# Patient Record
Sex: Male | Born: 1935 | Race: White | Hispanic: No | Smoking: Former smoker
Health system: Southern US, Community
[De-identification: ages and names within clinical notes are randomized; demographics above are authoritative.]

## PROBLEM LIST (undated history)

## (undated) DIAGNOSIS — K7460 Unspecified cirrhosis of liver: Secondary | ICD-10-CM

## (undated) DIAGNOSIS — E119 Type 2 diabetes mellitus without complications: Secondary | ICD-10-CM

## (undated) DIAGNOSIS — I2510 Atherosclerotic heart disease of native coronary artery without angina pectoris: Secondary | ICD-10-CM

## (undated) DIAGNOSIS — E538 Deficiency of other specified B group vitamins: Secondary | ICD-10-CM

## (undated) DIAGNOSIS — I10 Essential (primary) hypertension: Secondary | ICD-10-CM

## (undated) DIAGNOSIS — E785 Hyperlipidemia, unspecified: Secondary | ICD-10-CM

## (undated) DIAGNOSIS — K439 Ventral hernia without obstruction or gangrene: Secondary | ICD-10-CM

## (undated) DIAGNOSIS — C189 Malignant neoplasm of colon, unspecified: Secondary | ICD-10-CM

## (undated) DIAGNOSIS — N189 Chronic kidney disease, unspecified: Secondary | ICD-10-CM

## (undated) DIAGNOSIS — I8510 Secondary esophageal varices without bleeding: Secondary | ICD-10-CM

## (undated) DIAGNOSIS — I712 Thoracic aortic aneurysm, without rupture: Secondary | ICD-10-CM

## (undated) HISTORY — DX: Unspecified cirrhosis of liver: K74.60

## (undated) HISTORY — DX: Atherosclerotic heart disease of native coronary artery without angina pectoris: I25.10

## (undated) HISTORY — DX: Type 2 diabetes mellitus without complications: E11.9

## (undated) HISTORY — DX: Malignant neoplasm of colon, unspecified: C18.9

## (undated) HISTORY — DX: Hyperlipidemia, unspecified: E78.5

## (undated) HISTORY — DX: Essential (primary) hypertension: I10

## (undated) HISTORY — DX: Ventral hernia without obstruction or gangrene: K43.9

## (undated) HISTORY — DX: Thoracic aortic aneurysm, without rupture: I71.2

---

## 2000-07-16 DIAGNOSIS — C189 Malignant neoplasm of colon, unspecified: Secondary | ICD-10-CM

## 2000-07-16 HISTORY — DX: Malignant neoplasm of colon, unspecified: C18.9

## 2001-04-15 HISTORY — PX: PARTIAL COLECTOMY: SHX5273

## 2001-04-23 ENCOUNTER — Inpatient Hospital Stay (HOSPITAL_COMMUNITY): Admission: EM | Admit: 2001-04-23 | Discharge: 2001-05-06 | Payer: Self-pay | Admitting: Emergency Medicine

## 2001-04-23 ENCOUNTER — Encounter: Payer: Self-pay | Admitting: Emergency Medicine

## 2001-04-23 ENCOUNTER — Encounter (INDEPENDENT_AMBULATORY_CARE_PROVIDER_SITE_OTHER): Payer: Self-pay | Admitting: Specialist

## 2001-04-24 ENCOUNTER — Encounter: Payer: Self-pay | Admitting: Internal Medicine

## 2001-04-25 ENCOUNTER — Encounter: Payer: Self-pay | Admitting: Surgery

## 2001-04-25 ENCOUNTER — Encounter: Payer: Self-pay | Admitting: Internal Medicine

## 2001-04-29 ENCOUNTER — Encounter: Payer: Self-pay | Admitting: Internal Medicine

## 2001-04-30 ENCOUNTER — Encounter: Payer: Self-pay | Admitting: Internal Medicine

## 2001-05-28 ENCOUNTER — Encounter: Payer: Self-pay | Admitting: Surgery

## 2001-05-28 ENCOUNTER — Ambulatory Visit (HOSPITAL_BASED_OUTPATIENT_CLINIC_OR_DEPARTMENT_OTHER): Admission: RE | Admit: 2001-05-28 | Discharge: 2001-05-28 | Payer: Self-pay | Admitting: Surgery

## 2001-08-12 ENCOUNTER — Encounter: Admission: RE | Admit: 2001-08-12 | Discharge: 2001-08-12 | Payer: Self-pay | Admitting: Family Medicine

## 2001-11-26 ENCOUNTER — Encounter: Payer: Self-pay | Admitting: Surgery

## 2001-11-26 ENCOUNTER — Encounter: Admission: RE | Admit: 2001-11-26 | Discharge: 2001-11-26 | Payer: Self-pay | Admitting: Surgery

## 2001-12-01 ENCOUNTER — Encounter: Admission: RE | Admit: 2001-12-01 | Discharge: 2001-12-01 | Payer: Self-pay | Admitting: Surgery

## 2001-12-01 ENCOUNTER — Encounter: Payer: Self-pay | Admitting: Surgery

## 2001-12-05 ENCOUNTER — Encounter (INDEPENDENT_AMBULATORY_CARE_PROVIDER_SITE_OTHER): Payer: Self-pay

## 2001-12-05 ENCOUNTER — Ambulatory Visit (HOSPITAL_COMMUNITY): Admission: RE | Admit: 2001-12-05 | Discharge: 2001-12-05 | Payer: Self-pay | Admitting: Surgery

## 2001-12-26 ENCOUNTER — Encounter: Admission: RE | Admit: 2001-12-26 | Discharge: 2001-12-26 | Payer: Self-pay | Admitting: Family Medicine

## 2002-01-01 ENCOUNTER — Encounter: Admission: RE | Admit: 2002-01-01 | Discharge: 2002-01-01 | Payer: Self-pay | Admitting: Family Medicine

## 2002-01-13 HISTORY — PX: COLOSTOMY REVERSAL: SHX5782

## 2002-01-14 ENCOUNTER — Encounter: Payer: Self-pay | Admitting: Surgery

## 2002-01-19 ENCOUNTER — Encounter (INDEPENDENT_AMBULATORY_CARE_PROVIDER_SITE_OTHER): Payer: Self-pay | Admitting: *Deleted

## 2002-01-19 ENCOUNTER — Inpatient Hospital Stay (HOSPITAL_COMMUNITY): Admission: RE | Admit: 2002-01-19 | Discharge: 2002-01-26 | Payer: Self-pay | Admitting: Surgery

## 2002-03-24 ENCOUNTER — Encounter: Admission: RE | Admit: 2002-03-24 | Discharge: 2002-03-24 | Payer: Self-pay | Admitting: Family Medicine

## 2002-05-25 ENCOUNTER — Encounter: Admission: RE | Admit: 2002-05-25 | Discharge: 2002-05-25 | Payer: Self-pay | Admitting: Family Medicine

## 2002-06-01 ENCOUNTER — Encounter (HOSPITAL_BASED_OUTPATIENT_CLINIC_OR_DEPARTMENT_OTHER): Admission: RE | Admit: 2002-06-01 | Discharge: 2002-08-31 | Payer: Self-pay | Admitting: Internal Medicine

## 2002-07-01 ENCOUNTER — Ambulatory Visit (HOSPITAL_BASED_OUTPATIENT_CLINIC_OR_DEPARTMENT_OTHER): Admission: RE | Admit: 2002-07-01 | Discharge: 2002-07-01 | Payer: Self-pay | Admitting: Surgery

## 2002-08-26 ENCOUNTER — Encounter: Admission: RE | Admit: 2002-08-26 | Discharge: 2002-08-26 | Payer: Self-pay | Admitting: Family Medicine

## 2002-09-04 ENCOUNTER — Encounter (HOSPITAL_BASED_OUTPATIENT_CLINIC_OR_DEPARTMENT_OTHER): Admission: RE | Admit: 2002-09-04 | Discharge: 2002-12-03 | Payer: Self-pay | Admitting: Internal Medicine

## 2002-11-14 HISTORY — PX: COLONOSCOPY: SHX174

## 2002-11-26 ENCOUNTER — Ambulatory Visit (HOSPITAL_COMMUNITY): Admission: RE | Admit: 2002-11-26 | Discharge: 2002-11-26 | Payer: Self-pay | Admitting: Surgery

## 2002-12-02 ENCOUNTER — Encounter: Admission: RE | Admit: 2002-12-02 | Discharge: 2002-12-02 | Payer: Self-pay | Admitting: Family Medicine

## 2003-03-10 ENCOUNTER — Encounter: Admission: RE | Admit: 2003-03-10 | Discharge: 2003-03-10 | Payer: Self-pay | Admitting: Family Medicine

## 2003-03-11 ENCOUNTER — Encounter (HOSPITAL_BASED_OUTPATIENT_CLINIC_OR_DEPARTMENT_OTHER): Admission: RE | Admit: 2003-03-11 | Discharge: 2003-03-23 | Payer: Self-pay | Admitting: Internal Medicine

## 2003-03-31 ENCOUNTER — Ambulatory Visit (HOSPITAL_COMMUNITY): Admission: RE | Admit: 2003-03-31 | Discharge: 2003-03-31 | Payer: Self-pay | Admitting: Sports Medicine

## 2003-06-16 ENCOUNTER — Encounter: Admission: RE | Admit: 2003-06-16 | Discharge: 2003-06-16 | Payer: Self-pay | Admitting: Family Medicine

## 2003-07-01 ENCOUNTER — Encounter (HOSPITAL_BASED_OUTPATIENT_CLINIC_OR_DEPARTMENT_OTHER): Admission: RE | Admit: 2003-07-01 | Discharge: 2003-07-16 | Payer: Self-pay | Admitting: Internal Medicine

## 2003-09-22 ENCOUNTER — Encounter: Admission: RE | Admit: 2003-09-22 | Discharge: 2003-09-22 | Payer: Self-pay | Admitting: Family Medicine

## 2003-10-06 ENCOUNTER — Encounter (HOSPITAL_BASED_OUTPATIENT_CLINIC_OR_DEPARTMENT_OTHER): Admission: RE | Admit: 2003-10-06 | Discharge: 2003-10-26 | Payer: Self-pay | Admitting: Internal Medicine

## 2003-12-16 ENCOUNTER — Encounter: Admission: RE | Admit: 2003-12-16 | Discharge: 2003-12-16 | Payer: Self-pay | Admitting: Sports Medicine

## 2004-01-12 ENCOUNTER — Encounter (HOSPITAL_BASED_OUTPATIENT_CLINIC_OR_DEPARTMENT_OTHER): Admission: RE | Admit: 2004-01-12 | Discharge: 2004-01-21 | Payer: Self-pay | Admitting: Internal Medicine

## 2004-03-28 ENCOUNTER — Ambulatory Visit: Payer: Self-pay | Admitting: Family Medicine

## 2004-04-19 ENCOUNTER — Encounter (HOSPITAL_BASED_OUTPATIENT_CLINIC_OR_DEPARTMENT_OTHER): Admission: RE | Admit: 2004-04-19 | Discharge: 2004-05-03 | Payer: Self-pay | Admitting: Internal Medicine

## 2004-06-22 ENCOUNTER — Ambulatory Visit: Payer: Self-pay | Admitting: Family Medicine

## 2004-07-25 ENCOUNTER — Encounter (HOSPITAL_BASED_OUTPATIENT_CLINIC_OR_DEPARTMENT_OTHER): Admission: RE | Admit: 2004-07-25 | Discharge: 2004-08-08 | Payer: Self-pay | Admitting: Internal Medicine

## 2004-08-24 ENCOUNTER — Ambulatory Visit: Payer: Self-pay | Admitting: Family Medicine

## 2004-09-19 ENCOUNTER — Ambulatory Visit: Payer: Self-pay | Admitting: Oncology

## 2004-09-28 ENCOUNTER — Encounter: Admission: RE | Admit: 2004-09-28 | Discharge: 2004-09-28 | Payer: Self-pay | Admitting: Oncology

## 2004-10-23 ENCOUNTER — Ambulatory Visit: Payer: Self-pay | Admitting: Family Medicine

## 2005-01-18 ENCOUNTER — Ambulatory Visit (HOSPITAL_BASED_OUTPATIENT_CLINIC_OR_DEPARTMENT_OTHER): Admission: RE | Admit: 2005-01-18 | Discharge: 2005-01-18 | Payer: Self-pay | Admitting: Surgery

## 2005-01-23 ENCOUNTER — Ambulatory Visit: Payer: Self-pay | Admitting: Family Medicine

## 2005-03-16 ENCOUNTER — Ambulatory Visit: Payer: Self-pay | Admitting: Oncology

## 2005-04-02 ENCOUNTER — Encounter: Admission: RE | Admit: 2005-04-02 | Discharge: 2005-04-02 | Payer: Self-pay | Admitting: Oncology

## 2005-04-15 HISTORY — PX: VENTRAL HERNIA REPAIR: SHX424

## 2005-04-21 ENCOUNTER — Inpatient Hospital Stay (HOSPITAL_COMMUNITY): Admission: RE | Admit: 2005-04-21 | Discharge: 2005-04-22 | Payer: Self-pay | Admitting: Surgery

## 2005-05-03 ENCOUNTER — Ambulatory Visit: Payer: Self-pay | Admitting: Family Medicine

## 2005-09-17 ENCOUNTER — Ambulatory Visit: Payer: Self-pay | Admitting: Oncology

## 2005-10-17 ENCOUNTER — Ambulatory Visit: Payer: Self-pay | Admitting: Family Medicine

## 2005-11-16 ENCOUNTER — Ambulatory Visit: Payer: Self-pay | Admitting: Family Medicine

## 2006-03-14 ENCOUNTER — Ambulatory Visit: Payer: Self-pay | Admitting: Oncology

## 2006-03-19 LAB — COMPREHENSIVE METABOLIC PANEL
AST: 121 U/L — ABNORMAL HIGH (ref 0–37)
BUN: 17 mg/dL (ref 6–23)
Calcium: 9.4 mg/dL (ref 8.4–10.5)
Chloride: 104 mEq/L (ref 96–112)
Creatinine, Ser: 1.2 mg/dL (ref 0.40–1.50)
Glucose, Bld: 102 mg/dL — ABNORMAL HIGH (ref 70–99)

## 2006-03-19 LAB — CBC WITH DIFFERENTIAL/PLATELET
BASO%: 0.4 % (ref 0.0–2.0)
Basophils Absolute: 0 10*3/uL (ref 0.0–0.1)
EOS%: 3.6 % (ref 0.0–7.0)
HCT: 39.7 % (ref 38.7–49.9)
LYMPH%: 32.4 % (ref 14.0–48.0)
MCH: 32.8 pg (ref 28.0–33.4)
MCHC: 35.2 g/dL (ref 32.0–35.9)
MONO#: 0.5 10*3/uL (ref 0.1–0.9)
NEUT%: 53.1 % (ref 40.0–75.0)
Platelets: 317 10*3/uL (ref 145–400)

## 2006-03-19 LAB — LACTATE DEHYDROGENASE: LDH: 316 U/L — ABNORMAL HIGH (ref 94–250)

## 2006-03-28 ENCOUNTER — Ambulatory Visit (HOSPITAL_COMMUNITY): Admission: RE | Admit: 2006-03-28 | Discharge: 2006-03-28 | Payer: Self-pay | Admitting: Oncology

## 2006-05-03 ENCOUNTER — Ambulatory Visit: Payer: Self-pay | Admitting: Family Medicine

## 2006-05-16 ENCOUNTER — Ambulatory Visit: Payer: Self-pay | Admitting: Oncology

## 2006-05-16 HISTORY — PX: COLONOSCOPY: SHX174

## 2006-05-17 ENCOUNTER — Encounter (INDEPENDENT_AMBULATORY_CARE_PROVIDER_SITE_OTHER): Payer: Self-pay | Admitting: *Deleted

## 2006-05-17 ENCOUNTER — Ambulatory Visit (HOSPITAL_COMMUNITY): Admission: RE | Admit: 2006-05-17 | Discharge: 2006-05-17 | Payer: Self-pay | Admitting: Surgery

## 2006-05-21 LAB — COMPREHENSIVE METABOLIC PANEL
Alkaline Phosphatase: 33 U/L — ABNORMAL LOW (ref 39–117)
BUN: 12 mg/dL (ref 6–23)
Creatinine, Ser: 1.06 mg/dL (ref 0.40–1.50)
Glucose, Bld: 93 mg/dL (ref 70–99)
Total Bilirubin: 0.5 mg/dL (ref 0.3–1.2)

## 2006-05-21 LAB — HEPATITIS C ANTIBODY: HCV Ab: NEGATIVE

## 2006-08-13 ENCOUNTER — Ambulatory Visit: Payer: Self-pay | Admitting: Family Medicine

## 2006-08-20 ENCOUNTER — Ambulatory Visit: Payer: Self-pay | Admitting: Family Medicine

## 2006-08-27 ENCOUNTER — Ambulatory Visit: Payer: Self-pay | Admitting: Family Medicine

## 2006-09-03 ENCOUNTER — Ambulatory Visit (HOSPITAL_COMMUNITY): Admission: RE | Admit: 2006-09-03 | Discharge: 2006-09-03 | Payer: Self-pay | Admitting: Sports Medicine

## 2006-09-03 ENCOUNTER — Encounter (INDEPENDENT_AMBULATORY_CARE_PROVIDER_SITE_OTHER): Payer: Self-pay | Admitting: Cardiology

## 2006-09-12 DIAGNOSIS — C189 Malignant neoplasm of colon, unspecified: Secondary | ICD-10-CM | POA: Insufficient documentation

## 2006-09-12 DIAGNOSIS — E119 Type 2 diabetes mellitus without complications: Secondary | ICD-10-CM | POA: Insufficient documentation

## 2006-09-12 DIAGNOSIS — R259 Unspecified abnormal involuntary movements: Secondary | ICD-10-CM | POA: Insufficient documentation

## 2006-09-12 DIAGNOSIS — E669 Obesity, unspecified: Secondary | ICD-10-CM | POA: Insufficient documentation

## 2006-09-12 DIAGNOSIS — I10 Essential (primary) hypertension: Secondary | ICD-10-CM

## 2006-09-12 DIAGNOSIS — Z85038 Personal history of other malignant neoplasm of large intestine: Secondary | ICD-10-CM | POA: Insufficient documentation

## 2006-09-12 DIAGNOSIS — E781 Pure hyperglyceridemia: Secondary | ICD-10-CM | POA: Insufficient documentation

## 2007-02-28 ENCOUNTER — Encounter (INDEPENDENT_AMBULATORY_CARE_PROVIDER_SITE_OTHER): Payer: Self-pay | Admitting: Family Medicine

## 2007-02-28 ENCOUNTER — Ambulatory Visit: Payer: Self-pay | Admitting: Family Medicine

## 2007-02-28 LAB — CONVERTED CEMR LAB: Hgb A1c MFr Bld: 6.4 %

## 2007-03-07 ENCOUNTER — Encounter (INDEPENDENT_AMBULATORY_CARE_PROVIDER_SITE_OTHER): Payer: Self-pay | Admitting: Family Medicine

## 2007-03-07 DIAGNOSIS — R945 Abnormal results of liver function studies: Secondary | ICD-10-CM | POA: Insufficient documentation

## 2007-03-07 LAB — CONVERTED CEMR LAB
ALT: 144 units/L — ABNORMAL HIGH (ref 0–53)
BUN: 18 mg/dL (ref 6–23)
Calcium: 10.4 mg/dL (ref 8.4–10.5)
Cholesterol: 229 mg/dL — ABNORMAL HIGH (ref 0–200)
HCT: 45.6 % (ref 39.0–52.0)
HDL: 33 mg/dL — ABNORMAL LOW (ref >39)
MCHC: 32.9 g/dL (ref 30.0–36.0)
Platelets: 309 10*3/uL (ref 150–400)
RBC: 4.78 M/uL (ref 4.22–5.81)
Total Bilirubin: 0.6 mg/dL (ref 0.3–1.2)
Triglycerides: 213 mg/dL — ABNORMAL HIGH (ref <150)
VLDL: 43 mg/dL — ABNORMAL HIGH (ref 0–40)
WBC: 4.5 10*3/uL (ref 4.0–10.5)

## 2007-04-07 ENCOUNTER — Encounter (INDEPENDENT_AMBULATORY_CARE_PROVIDER_SITE_OTHER): Payer: Self-pay | Admitting: Family Medicine

## 2007-04-07 ENCOUNTER — Ambulatory Visit: Payer: Self-pay | Admitting: Sports Medicine

## 2007-04-07 LAB — CONVERTED CEMR LAB
ALT: 141 units/L — ABNORMAL HIGH (ref 0–53)
AST: 127 units/L — ABNORMAL HIGH (ref 0–37)
Albumin: 4.2 g/dL (ref 3.5–5.2)
Alkaline Phosphatase: 46 units/L (ref 39–117)
CO2: 27 meq/L (ref 19–32)
Calcium: 10.2 mg/dL (ref 8.4–10.5)
Chloride: 103 meq/L (ref 96–112)
Glucose, Bld: 143 mg/dL — ABNORMAL HIGH (ref 70–99)
Total Bilirubin: 0.6 mg/dL (ref 0.3–1.2)
Total Protein: 7.3 g/dL (ref 6.0–8.3)

## 2007-05-14 ENCOUNTER — Encounter (INDEPENDENT_AMBULATORY_CARE_PROVIDER_SITE_OTHER): Payer: Self-pay | Admitting: Family Medicine

## 2007-05-15 ENCOUNTER — Ambulatory Visit: Payer: Self-pay | Admitting: Oncology

## 2007-05-19 ENCOUNTER — Encounter (INDEPENDENT_AMBULATORY_CARE_PROVIDER_SITE_OTHER): Payer: Self-pay | Admitting: Family Medicine

## 2007-05-19 LAB — CBC WITH DIFFERENTIAL/PLATELET
BASO%: 0.8 % (ref 0.0–2.0)
Eosinophils Absolute: 0.2 10*3/uL (ref 0.0–0.5)
HCT: 41.3 % (ref 38.7–49.9)
MCHC: 35.8 g/dL (ref 32.0–35.9)
MONO#: 0.6 10*3/uL (ref 0.1–0.9)
NEUT#: 2.9 10*3/uL (ref 1.5–6.5)
NEUT%: 50 % (ref 40.0–75.0)
Platelets: 293 10*3/uL (ref 145–400)
WBC: 5.8 10*3/uL (ref 4.0–10.0)
lymph#: 2.1 10*3/uL (ref 0.9–3.3)

## 2007-05-19 LAB — COMPREHENSIVE METABOLIC PANEL
ALT: 159 U/L — ABNORMAL HIGH (ref 0–53)
CO2: 25 mEq/L (ref 19–32)
Calcium: 9.3 mg/dL (ref 8.4–10.5)
Chloride: 102 mEq/L (ref 96–112)
Creatinine, Ser: 1.04 mg/dL (ref 0.40–1.50)
Glucose, Bld: 172 mg/dL — ABNORMAL HIGH (ref 70–99)
Sodium: 140 mEq/L (ref 135–145)
Total Protein: 7.3 g/dL (ref 6.0–8.3)

## 2007-05-19 LAB — LACTATE DEHYDROGENASE: LDH: 244 U/L (ref 94–250)

## 2007-05-19 LAB — CEA: CEA: 0.5 ng/mL (ref 0.0–5.0)

## 2007-05-21 ENCOUNTER — Encounter (INDEPENDENT_AMBULATORY_CARE_PROVIDER_SITE_OTHER): Payer: Self-pay | Admitting: Family Medicine

## 2007-05-21 ENCOUNTER — Ambulatory Visit (HOSPITAL_COMMUNITY): Admission: RE | Admit: 2007-05-21 | Discharge: 2007-05-21 | Payer: Self-pay | Admitting: Oncology

## 2007-05-26 ENCOUNTER — Ambulatory Visit: Payer: Self-pay | Admitting: Family Medicine

## 2007-05-26 LAB — CONVERTED CEMR LAB: HDL goal, serum: 40 mg/dL

## 2007-05-28 ENCOUNTER — Encounter: Payer: Self-pay | Admitting: *Deleted

## 2007-05-30 ENCOUNTER — Encounter (INDEPENDENT_AMBULATORY_CARE_PROVIDER_SITE_OTHER): Payer: Self-pay | Admitting: Family Medicine

## 2007-06-23 ENCOUNTER — Ambulatory Visit: Payer: Self-pay | Admitting: Family Medicine

## 2007-06-23 ENCOUNTER — Encounter (INDEPENDENT_AMBULATORY_CARE_PROVIDER_SITE_OTHER): Payer: Self-pay | Admitting: Family Medicine

## 2007-06-30 LAB — CONVERTED CEMR LAB
ALT: 167 units/L — ABNORMAL HIGH (ref 0–53)
Alkaline Phosphatase: 58 units/L (ref 39–117)
CO2: 24 meq/L (ref 19–32)
Chloride: 104 meq/L (ref 96–112)
Potassium: 4.7 meq/L (ref 3.5–5.3)
Sodium: 144 meq/L (ref 135–145)
Triglycerides: 210 mg/dL — ABNORMAL HIGH (ref <150)

## 2007-07-29 ENCOUNTER — Ambulatory Visit: Payer: Self-pay | Admitting: Family Medicine

## 2007-08-29 ENCOUNTER — Ambulatory Visit: Payer: Self-pay | Admitting: Family Medicine

## 2007-09-25 ENCOUNTER — Ambulatory Visit: Payer: Self-pay | Admitting: Family Medicine

## 2007-10-29 ENCOUNTER — Ambulatory Visit: Payer: Self-pay | Admitting: Family Medicine

## 2007-11-04 ENCOUNTER — Encounter (INDEPENDENT_AMBULATORY_CARE_PROVIDER_SITE_OTHER): Payer: Self-pay | Admitting: Family Medicine

## 2007-11-12 ENCOUNTER — Encounter (INDEPENDENT_AMBULATORY_CARE_PROVIDER_SITE_OTHER): Payer: Self-pay | Admitting: Family Medicine

## 2008-01-05 ENCOUNTER — Encounter (INDEPENDENT_AMBULATORY_CARE_PROVIDER_SITE_OTHER): Payer: Self-pay | Admitting: Family Medicine

## 2008-01-21 ENCOUNTER — Encounter: Payer: Self-pay | Admitting: Family Medicine

## 2008-01-21 ENCOUNTER — Ambulatory Visit: Payer: Self-pay | Admitting: Family Medicine

## 2008-01-21 LAB — CONVERTED CEMR LAB
ALT: 80 units/L — ABNORMAL HIGH (ref 0–53)
AST: 70 units/L — ABNORMAL HIGH (ref 0–37)
Alkaline Phosphatase: 39 units/L (ref 39–117)
BUN: 15 mg/dL (ref 6–23)
CO2: 27 meq/L (ref 19–32)
Calcium: 9.4 mg/dL (ref 8.4–10.5)
Chloride: 101 meq/L (ref 96–112)
Glucose, Bld: 135 mg/dL — ABNORMAL HIGH (ref 70–99)
Hgb A1c MFr Bld: 6.4 %
Potassium: 4.8 meq/L (ref 3.5–5.3)

## 2008-01-22 ENCOUNTER — Telehealth: Payer: Self-pay | Admitting: Family Medicine

## 2008-01-29 ENCOUNTER — Ambulatory Visit: Payer: Self-pay | Admitting: Oncology

## 2008-02-02 ENCOUNTER — Encounter: Payer: Self-pay | Admitting: Family Medicine

## 2008-02-02 LAB — CONVERTED CEMR LAB
ALT: 110 units/L
AST: 91 units/L
BUN: 16 mg/dL
CO2: 24 meq/L
Creatinine, Ser: 1.12 mg/dL
Glucose, Bld: 104 mg/dL
Sodium: 139 meq/L

## 2008-02-02 LAB — CBC WITH DIFFERENTIAL/PLATELET
Basophils Absolute: 0 10*3/uL (ref 0.0–0.1)
Eosinophils Absolute: 0.2 10*3/uL (ref 0.0–0.5)
HCT: 39.8 % (ref 38.7–49.9)
HGB: 14 g/dL (ref 13.0–17.1)
LYMPH%: 31.1 % (ref 14.0–48.0)
MONO#: 0.6 10*3/uL (ref 0.1–0.9)
NEUT#: 3.3 10*3/uL (ref 1.5–6.5)
Platelets: 289 10*3/uL (ref 145–400)
RBC: 4.32 10*6/uL (ref 4.20–5.71)
WBC: 5.9 10*3/uL (ref 4.0–10.0)

## 2008-02-03 LAB — COMPREHENSIVE METABOLIC PANEL
ALT: 110 U/L — ABNORMAL HIGH (ref 0–53)
AST: 91 U/L — ABNORMAL HIGH (ref 0–37)
Albumin: 4.5 g/dL (ref 3.5–5.2)
CO2: 24 mEq/L (ref 19–32)
Calcium: 9.7 mg/dL (ref 8.4–10.5)
Chloride: 103 mEq/L (ref 96–112)
Potassium: 4.5 mEq/L (ref 3.5–5.3)

## 2008-02-03 LAB — LACTATE DEHYDROGENASE: LDH: 173 U/L (ref 94–250)

## 2008-02-04 ENCOUNTER — Encounter: Payer: Self-pay | Admitting: Family Medicine

## 2008-02-04 ENCOUNTER — Ambulatory Visit (HOSPITAL_COMMUNITY): Admission: RE | Admit: 2008-02-04 | Discharge: 2008-02-04 | Payer: Self-pay | Admitting: Oncology

## 2008-02-09 ENCOUNTER — Encounter: Payer: Self-pay | Admitting: Family Medicine

## 2008-02-13 ENCOUNTER — Ambulatory Visit (HOSPITAL_COMMUNITY): Admission: RE | Admit: 2008-02-13 | Discharge: 2008-02-13 | Payer: Self-pay | Admitting: Oncology

## 2008-02-13 ENCOUNTER — Encounter: Payer: Self-pay | Admitting: Family Medicine

## 2008-03-16 ENCOUNTER — Ambulatory Visit: Payer: Self-pay | Admitting: Sports Medicine

## 2008-04-05 ENCOUNTER — Ambulatory Visit: Payer: Self-pay | Admitting: Family Medicine

## 2008-05-20 ENCOUNTER — Ambulatory Visit: Payer: Self-pay | Admitting: Oncology

## 2008-05-25 ENCOUNTER — Ambulatory Visit (HOSPITAL_COMMUNITY): Admission: RE | Admit: 2008-05-25 | Discharge: 2008-05-25 | Payer: Self-pay | Admitting: Oncology

## 2008-05-27 ENCOUNTER — Encounter: Payer: Self-pay | Admitting: Family Medicine

## 2008-05-27 LAB — COMPREHENSIVE METABOLIC PANEL
AST: 102 U/L — ABNORMAL HIGH (ref 0–37)
Alkaline Phosphatase: 40 U/L (ref 39–117)
BUN: 16 mg/dL (ref 6–23)
Calcium: 9.7 mg/dL (ref 8.4–10.5)
Creatinine, Ser: 1.14 mg/dL (ref 0.40–1.50)
Total Bilirubin: 0.7 mg/dL (ref 0.3–1.2)

## 2008-05-27 LAB — CBC WITH DIFFERENTIAL/PLATELET
BASO%: 0.4 % (ref 0.0–2.0)
Basophils Absolute: 0 10*3/uL (ref 0.0–0.1)
EOS%: 4.1 % (ref 0.0–7.0)
HCT: 39.2 % (ref 38.7–49.9)
HGB: 13.9 g/dL (ref 13.0–17.1)
LYMPH%: 34.8 % (ref 14.0–48.0)
MCH: 33 pg (ref 28.0–33.4)
MCHC: 35.4 g/dL (ref 32.0–35.9)
MCV: 93.3 fL (ref 81.6–98.0)
MONO%: 6.3 % (ref 0.0–13.0)
NEUT%: 54.4 % (ref 40.0–75.0)

## 2008-05-31 ENCOUNTER — Encounter: Payer: Self-pay | Admitting: Family Medicine

## 2008-06-03 ENCOUNTER — Encounter: Payer: Self-pay | Admitting: Family Medicine

## 2008-06-03 LAB — CONVERTED CEMR LAB
ALT: 102 units/L
AST: 102 units/L

## 2009-05-19 ENCOUNTER — Ambulatory Visit: Payer: Self-pay | Admitting: Oncology

## 2009-05-23 LAB — CBC WITH DIFFERENTIAL/PLATELET
Basophils Absolute: 0 10*3/uL (ref 0.0–0.1)
EOS%: 3.4 % (ref 0.0–7.0)
Eosinophils Absolute: 0.2 10*3/uL (ref 0.0–0.5)
HGB: 14.4 g/dL (ref 13.0–17.1)
MCH: 33.1 pg (ref 27.2–33.4)
MCV: 95.6 fL (ref 79.3–98.0)
MONO%: 9.5 % (ref 0.0–14.0)
NEUT#: 2.9 10*3/uL (ref 1.5–6.5)
RBC: 4.36 10*6/uL (ref 4.20–5.82)
RDW: 13.4 % (ref 11.0–14.6)
lymph#: 2.2 10*3/uL (ref 0.9–3.3)

## 2009-05-23 LAB — COMPREHENSIVE METABOLIC PANEL
AST: 40 U/L — ABNORMAL HIGH (ref 0–37)
Albumin: 4.6 g/dL (ref 3.5–5.2)
Alkaline Phosphatase: 45 U/L (ref 39–117)
Calcium: 10.1 mg/dL (ref 8.4–10.5)
Chloride: 103 mEq/L (ref 96–112)
Potassium: 4.8 mEq/L (ref 3.5–5.3)
Sodium: 141 mEq/L (ref 135–145)
Total Protein: 7.5 g/dL (ref 6.0–8.3)

## 2009-07-16 HISTORY — PX: COLONOSCOPY: SHX174

## 2009-08-08 ENCOUNTER — Ambulatory Visit (HOSPITAL_COMMUNITY): Admission: RE | Admit: 2009-08-08 | Discharge: 2009-08-08 | Payer: Self-pay | Admitting: Surgery

## 2009-12-26 ENCOUNTER — Encounter: Payer: Self-pay | Admitting: Family Medicine

## 2010-05-18 ENCOUNTER — Ambulatory Visit: Payer: Self-pay | Admitting: Oncology

## 2010-05-22 LAB — LACTATE DEHYDROGENASE: LDH: 153 U/L (ref 94–250)

## 2010-05-22 LAB — CBC WITH DIFFERENTIAL/PLATELET
BASO%: 0.4 % (ref 0.0–2.0)
Basophils Absolute: 0 10*3/uL (ref 0.0–0.1)
Eosinophils Absolute: 0.2 10*3/uL (ref 0.0–0.5)
HGB: 13.9 g/dL (ref 13.0–17.1)
MCV: 94 fL (ref 79.3–98.0)
MONO#: 0.5 10*3/uL (ref 0.1–0.9)
MONO%: 10.6 % (ref 0.0–14.0)
NEUT#: 2.4 10*3/uL (ref 1.5–6.5)
Platelets: 235 10*3/uL (ref 140–400)
RBC: 4.16 10*6/uL — ABNORMAL LOW (ref 4.20–5.82)
lymph#: 1.9 10*3/uL (ref 0.9–3.3)

## 2010-05-22 LAB — COMPREHENSIVE METABOLIC PANEL
ALT: 89 U/L — ABNORMAL HIGH (ref 0–53)
Chloride: 101 mEq/L (ref 96–112)
Glucose, Bld: 153 mg/dL — ABNORMAL HIGH (ref 70–99)
Potassium: 4.2 mEq/L (ref 3.5–5.3)

## 2010-08-15 NOTE — Miscellaneous (Signed)
Summary: chart update  Clinical Lists Changes  Problems: Removed problem of NEED PROPHYLACTIC VACCINATION&INOCULATION FLU (ICD-V04.81) Removed problem of DIARRHEA (ICD-787.91) Observations: Added new observation of PAST SURG HX: F 78%; low risk study - 05/10/2003, Cardiolite:  inferolateral wall thinning - 05/10/2003,  Colonoscopy - 2 benign adenomas resected   ETT:  negative, yet 6 mets, poor HR recovery - 04/05/2003,  Large umbilical hernia repair - 05/03/2005, Left colectomy - 04/15/2001,  Reanastomosis/colostomy takedown - 01/13/2002,  repair of suture granuloma - 01/13/2005,  s/p chemo 5-FU/leucovorin - 05/25/2002 (12/26/2009 12:28) Added new observation of PRIMARY MD: Myrtie Soman  MD (12/26/2009 12:28)       Past History:  Past Surgical History: F 78%; low risk study - 05/10/2003, Cardiolite:  inferolateral wall thinning - 05/10/2003,  Colonoscopy - 2 benign adenomas resected   ETT:  negative, yet 6 mets, poor HR recovery - 04/05/2003,  Large umbilical hernia repair - 05/03/2005, Left colectomy - 04/15/2001,  Reanastomosis/colostomy takedown - 01/13/2002,  repair of suture granuloma - 01/13/2005,  s/p chemo 5-FU/leucovorin - 05/25/2002

## 2010-11-15 ENCOUNTER — Encounter (INDEPENDENT_AMBULATORY_CARE_PROVIDER_SITE_OTHER): Payer: Self-pay | Admitting: Surgery

## 2010-12-01 NOTE — Consult Note (Signed)
[place]. [facility]  Patient:    Mark Le, Mark Le Visit Number: [id number] MRN: [id number]          Service Type: MED Location: [id number] Attending Physician:  Phifer, Trinna Post Proc. Date: 04/23/01 Admit Date:  04/23/2001                            Consultation Report  CHIEF COMPLAINT:  Abdominal pain and vomiting.  HISTORY OF PRESENT ILLNESS:  This is a 76 year old white male who gives a four-month history of intermittent crampy abdominal pain.  This summer he had a couple of days of severe pain and vomiting, but did not get much of a workup.  He has currently begun a GI workup in [place], [place].  He states that he had an ultrasound of his abdomen last week that was reportedly normal. He was scheduled to have upper endoscopy and colonoscopy next week.  He now presents with about a two to three-day history of increasing crampy abdominal pain and vomiting and distention.  He has not had any bowel movements for two or three days.  He does report that he lost about 25 pounds over the summer and does not think that he gained that back.  This was not intentional.  He came to the emergency room and appears to have a bowel obstruction.  He is being admitted by the medical teaching service and I was called in consultation.  PAST MEDICAL HISTORY: 1. Diabetes mellitus type 2 diagnosed three years ago. 2. Hypertension diagnosed five years ago. 3. Hyperlipidemia. 4. He denies any prior abdominal surgeries or surgery of any kind. 5. Possibly had a tonsillectomy.  MEDICATIONS: 1. Zocor 40 mg p.o. q.d. 2. Actos 45 mg q.d. 3. Tricor 160 mg p.o. q.d. 4. Accupril 40 mg q.d.  He takes no other medicines, aspirin, herbs, or supplements.  ALLERGIES:  No known drug allergies.  SOCIAL HISTORY:  The patient is divorced.  He is a retired Chartered certified accountant.  He quit smoking 10 years ago.  He never drank much alcohol.  FAMILY HISTORY:  Mother died in a motor  vehicle accident.  Father died of a stroke at age 32.  Siblings living and well.  Children living and well. No family history of cancer.  REVIEW OF SYSTEMS:  All systems are reviewed and are noncontributory except as described above.  PHYSICAL EXAMINATION:  GENERAL:  Pleasant, but clearly overweight elderly gentleman in mild distress.  VITAL SIGNS:  Temperature 98.7, blood pressure 154/88, pulse 109, respirations 20.  HEENT:  Sclerae clear, extraocular movements intact.  Oropharynx clear.  NECK:  Supple, nontender, no mass.  No adenopathy and no bruit.  LUNGS:  Clear to auscultation with no CVA tenderness.  HEART:  Regular rate and rhythm.  I do not detect a murmur.  ABDOMEN:  Moderately distended.  Bowel sounds are present and occasionally high pitched with rushes.  There is no hernia or palpable mass.  He is somewhat tympanitic.  There is some tenderness, perhaps more in the right lower quadrant than the left lower quadrant, but generally soft.  GENITOURINARY:  Normal male pattern, no inguinal hernia.  EXTREMITIES:  No edema, good pulses.  RECTAL:  Hemoccult positive stool according to Youlanda Mighty, M.D.  LABORATORY DATA:  Abdominal x-ray showed some distended loops of small bowel and large bowel consistent with partial small bowel obstruction.  There is no free air.  Hemoglobin 14.1, white count 5400,  BUN 28, creatinine 1.5, glucose 138, amylase 10.  Liver function tests normal.  Urinalysis normal.  IMPRESSION: 1. Partial small bowel obstruction is likely, although, I cannot completely    exclude colonic source that could be causing his symptoms. 2. Hypertension. 3. Type 2 diabetes mellitus.  PLAN:  The patient will be admitted.  Nasogastric tube will be inserted for decompression.  We will repeat lab work and x-rays tomorrow morning and reassess the situation.  If he improves rapidly, we may consider a more methodical GI workup, but if not, he may require laparotomy  with possible preoperative barium enema. Attending Physician:  Phifer, Trinna Post DD:  04/23/01 TD:  04/24/01 Job: [id number]

## 2010-12-01 NOTE — Op Note (Signed)
[place]. [facility]  Patient:    Mark Le, Mark Le Visit Number: [id number] MRN: [id number]          Service Type: SUR Location: [id number] Attending Physician:  Andre Lefort Dictated by:   Sandria Bales. Ezzard Standing, M.D. Proc. Date: 01/19/02 Admit Date:  01/19/2002   CC:         Genene Churn. Cyndie Chime, M.D.  Dr. Gearldine Bienenstock   Operative Report  DATE OF BIRTH:  Jul 25, 1934  PREOPERATIVE DIAGNOSIS:  Status post left colectomy with left end colostomy for obstructing colon carcinoma.  POSTOPERATIVE DIAGNOSIS:  Left end colostomy, extensive intra-abdominal adhesions, multiple abdominal wall hernia.  PROCEDURE:  Closure of colostomy with end-to-end splenic flexure to sigmoid colon anastomosis, enterolysis of adhesions (taking 70 minutes), and closure of abdominal wall and hernias.  SURGEON:  Sandria Bales. Ezzard Standing, M.D.  ASSISTANT:  Anselm Pancoast. Zachery Dakins, M.D.  ANESTHESIA:  General endotracheal.  ESTIMATED BLOOD LOSS:  300 cc.  DRAINS:  None.  I did leave some Telfa wicks in the wound.  INDICATIONS FOR PROCEDURE:  The patient is a 76 year old white male who was a patient of the medical teaching service who presented with obstructing left colon carcinoma in October of 2002.  He underwent a primary resection by me with an end colostomy.  The patient was seen by Dr. Riley Churches.  Has had chemotherapy, done well with this with new and recurrent disease.  He has undergone a repeat CT scan which was negative.  I did a colonoscopy on him and found several polyps which were benign and resected and he had a CEA which is normal.  He now comes for reversal of his colostomy.  The patient completed both a GoLYTELY bowel prep and enema prep in preparation for this.  DESCRIPTION OF PROCEDURE:  The patient was given 2 g of cefotetan at the initiation of the procedure.  He had an nasogastric tube in place, Foley catheter in place.  His abdomen was shaved, prepped  with Betadine solution and sterilely draped.  He had PAS stockings in place.  The old midline scar was excised.  There was some evidence of hernias in his abdominal incision and he had an old scar in the lower part which was thickened which I excised.  The abdomen was entered.  There was noted to be fairly extensive small bowel adhesions up to his midline abdomen and around his colostomy site.  It took me from 2:10 p.m. to 3:20 p.m. taking down these adhesions.  I spent 70 minutes doing an enterolysis.  I did have a couple of small superficial tears in the serosa which I oversewed with 3-0 silk suture but had no transmural injury that I could identify.  I then turned by attention to the left colon or splenic flexure colostomy which was taken down by lifting a piece of skin out with the colostomy and taken into the abdominal cavity.  Again hemostasis was controlled with Bovie electrocautery.  I then identified my distal end of bowel which was right at sort of the first third of the sigmoid colon.  This actually mobilized up fairly well along the left colonic gutter.  I thought I could bring the two ends of bowel together easily without any tension, so I then resected about three inches off of the proximal colon which had been been beat up during getting the colostomy down and about one inch of the distal colon.  Both wounds were widely patent, had  good blood supply.  I then did an end-to-end anastomosis by using interrupted 2-0 silk sutures.  This produced a fingerbreadth and a half opening.  The patient continued to be a little oozy during the whole case but there was no bleeding and the wound looked tight at the end of the colostomy closure.  I then closed the mesentery laterally using interrupted 2-0 silk sutures.  I was able to palpate the right and left lobes of the liver.  They were grossly normal.  His gallbladder was grossly normal. His stomach had a nasogastric tube in proper  position and was grossly normal. The spleen was unremarkable and his retroperitoneum was unremarkable.  I then irrigated with about 5L of saline and saw no evidence of recurrent tumor in the patients abdomen.  I then had a problem of closing his abdomen. He had at least three fairly sizable hernias.  Two were to the left of his old midline incision and probably were anywhere from 4 to 5 cm in diameter each which left a whole thinned out area between the abdominal incision and the left upper quadrant colostomy.  The third one was sort of superior right at the umbilicus.  This kind of involved both sides of the fascia.  For this reason, I used a permanent nonabsorbable suture of interrupted #1 Novofil sure.  I actually buttressed some of the central sutures with a Vicryl mesh and closed the colostomy in two layers with interrupted #1 Novofil sutures and beginning in the midline with interrupted #1 Novofil sutures.  I was able to close the abdomen and irrigated the wound, cut back some of the skin, then stapled the skin closed, placed Telfa wicks in the wound to seep out any dressing, then sterilely dressed it and put an abdominal binder on Mark Le.  The patient tolerated the procedure well and was transported to the recovery room in good condition.  The sponge and needle counts were correct at the end of the case.  Estimated blood loss was about 300 to 400 cc.  The only drains were Telfa wicks I put in the abdominal wound.  Otherwise, he did well. Dictated by:   Sandria Bales. Ezzard Standing, M.D. Attending Physician:  Andre Lefort DD:  01/19/02 TD:  01/21/02 Job: [id number]

## 2010-12-01 NOTE — Op Note (Signed)
[place]. [facility]  Patient:    STEPHANOS, FAN Visit Number: [id number] MRN: [id number]          Service Type: MED Location: [phone] Attending Physician:  Phifer, Trinna Post Dictated by:   Sandria Bales. Ezzard Standing, M.D. Proc. Date: 04/25/01 Admit Date:  04/23/2001   CC:         Alvester Morin, M.D.  Angelia Mould. Derrell Lolling, M.D.  Reuben Likes, M.D.   Operative Report  DATE OF BIRTH:  1934/10/11.  PREOPERATIVE DIAGNOSIS:  Obstructing lesion distal left colon and sigmoid colon junction.  POSTOPERATIVE DIAGNOSIS:  Obstructing carcinoma at the distal left colon and sigmoid colon junction.  OPERATION:  Left hemicolectomy with mobilization of splenic flexure in left colostomy.  SURGEON:  Sandria Bales. Ezzard Standing, M.D.  ASSISTANT:  Sharlet Salina T. Hoxworth, M.D.  ANESTHESIA:  General endotracheal anesthesia.  ESTIMATED BLOOD LOSS:  300 cc.  DRAINS:  None.  INDICATION:  Mr. Cu is a 76 year old white male who was admitted on the 9th of October of 2002 by the medicine teaching service.  He had a partial bowel obstruction actually treated about a month ago, I think, in [place].  He was discharged home.  He then represented with lower quadrant discomfort, vomiting, and bloating.  CT scan yesterday showed a cut-off sign at the left colon/sigmoid colon junction and I discussed with the patient about proceeding with operative surgery and probable need for a colostomy.  DESCRIPTION OF PROCEDURE:  The patient was brought to the operating room.  He had a subclavian line placed by Dr. Bedelia Person.  He was taken to the operating room under general anesthesia, had a Foley catheter placed, already had a NG tube in place, and PAS stocking then placed.  He was given 2 gm of cefotetan. His abdomen was prepped with Betadine solution and sterilely draped.  A generous midline incision was made with sharp dissection carried down to the abdominal cavity.  Abdominal  exploration revealed the right and left lobes of the liver unremarkable.  Gallbladder had no palpable stones.  The stomach had an NG tube place but was otherwise unremarkable.  The small-bowel, ligament of Treitz, and terminal ileum were distended throughout but no lesion or mass of the mesentery.  Right colon and transverse colon were distended with stool and left colon was also dilated.  The patient had a napkin ring lesion, probably about 3 to 4 cm in diameter, right at the junction of the left colon and sigmoid colon.  He had no retroperitoneal adenopathy.  No other evidence of cancer was in the abdominal cavity.  First I tried to milk some of the contents of the small bowel back into the NG tube with some success.  Then I turned my attention to the left colon lesion and there I was able to identify the ureter.  The patient was a very difficult operation because of his size, obesity, and distended bowel.  I mobilized the sigmoid colon, divided it distally about 10 cm beyond the tumor with a 70 GIA stapler.  I then took down the mesentery between [place] clamps and 2-0 silk ties, rounded the splenic flexure which I mobilized, took down some of the omentum.  I even used single/double ligation of the mesentery.  Got to about 20 cm proximal to the tumor and then divided the colon a second time with the GIA stapler.  I then irrigated the abdomen, brought out a colostomy in the left upper quadrant  cutting out a patch of skin about 2.5 cm in diameter, cutting out the fat under this, going through the anterior rectus and posterior rectus fascia, dividing that.  I brought the colostomy out under no tension.  The bowel was viable and went I brought it out, I then tacked the colon up to the abdominal wall with interrupted 3-0 silk sutures.  There were three of them.  I then irrigated the abdomen with three liters of warm saline.  There was no bleeding along the left colonic gutter.  Our  sponge and needle count were correct.  The abdomen was then closed using two running #1 PDS sutures with interrupted Novofil about every fifth row.  The subcutaneous tissue was then irrigated. The skin closed with a skin gun.  I then matured the colostomy first taking some of the colon down to the fascia with 3-0 Vicryl sutures, then secured the colostomy to the skin with interrupted 3-0 Vicryl sutures.  I then sucked about 800 to 1000 cc of loose, watery stool out of the colostomy.  I placed a colostomy bag, sterilely dressed the wound.  The patient was transferred to the recovery room.  Sponge and needle correct at the end of the dictation. Dictated by:   Sandria Bales. Ezzard Standing, M.D. Attending Physician:  Phifer, Trinna Post DD:  04/25/01 TD:  04/26/01 Job: [id number]

## 2010-12-01 NOTE — Op Note (Signed)
[place]. [facility]  Patient:    Mark Le, Mark Le Visit Number: [id number] MRN: [id number]          Service Type: END Location: ENDO Attending Physician:  Andre Lefort Dictated by:   Sandria Bales. Ezzard Standing, M.D. Proc. Date: 12/05/01 Admit Date:  12/05/2001 Discharge Date: 12/05/2001   CC:         Dr. Bobbie Stack. Cyndie Chime, M.D.   Operative Report  DATE OF BIRTH:  1935-01-01  PREOPERATIVE DIAGNOSIS:  History of obstructing colon carcinoma.  POSTOPERATIVE DIAGNOSIS:  Approximately 1.5 cm polyp of sigmoid colon, 0.5 cm polyp of transverse colon.  OPERATION PERFORMED:  Colonoscopy with polypectomy.  SURGEON:  Sandria Bales. Ezzard Standing, M.D.  ASSISTANT:  None.  ANESTHESIA:  50 mg of Demerol, 4 mg of Versed.  INDICATIONS FOR PROCEDURE:  Mark Le is a 76 year old white male who had an obstructing colon carcinoma of his left colon resected in October 2002.  He completed chemotherapy by Dr. Riley Churches.  He now comes for discussion for reanastomosing together.  He has undergone both a GoLYTELY bowel prep and multiple Fleets enemas to clean out his rectum.  He is in the endoscopy suite monitored with pulse oximetry, EKG and blood pressure cuff and he is on nasal O2. He has his Port-A-Cath accessed.  He was given 50 mg of Demerol and 4 mg of Versed.  DESCRIPTION OF PROCEDURE:  First I did the rectal stump which does up to about 40 cm.  He had evidence of moderate sigmoid diverticulosis.  The stump ends at 40  cm from the anal verge.  He did have a large kind of 1.5 cm polyp of his sigmoid colon at 30 cm.  I removed this polyp with a snare and retrieved the polyp.  On retroflexing the scope within his rectum, it was unremarkable.  I then passed the scope through his colostomy in his left upper quadrant and passed this through his transverse colon to his cecum.  His cecum was unremarkable.  In his transverse colon he had a small polyp about 5  mm in size.  I also removed this with a snare and sent it as a separate specimen and labeled it.  The remainder of his colon was unremarkable.  Though his prep was only moderate, he did have have some stool in his colon.  We will plan to  reverse his colostomy within the next one to two months and he will need a follow-up colonoscopy in about a years time.   DESCRIPTION OF PROCEDURE: Dictated by:   Sandria Bales. Ezzard Standing, M.D. Attending Physician:  Andre Lefort DD:  12/05/01 TD:  12/08/01 Job: [id number]

## 2010-12-01 NOTE — Op Note (Signed)
NAME:  Mark Le, Mark Le NO.:  [id number]   MEDICAL RECORD NO.:  [id number]          PATIENT TYPE:  AMB   LOCATION:  DAY                          FACILITY:  [facility]   PHYSICIAN:  Sandria Bales. Ezzard Standing, M.D.  DATE OF BIRTH:  1934/12/23   DATE OF PROCEDURE:  04/19/2005  DATE OF DISCHARGE:                                 OPERATIVE REPORT   PREOPERATIVE DIAGNOSIS:  Ventral hernia.   POSTOPERATIVE DIAGNOSIS:  Ventral hernia approximately 13 x 17 cm.   PROCEDURE:  Laparoscopic repair of ventral hernia with 20 x 27 cm Parietex  mesh and enterolysis of adhesions (approximately 90 minutes spent on  enterolysis).   SURGEON:  Sandria Bales. Ezzard Standing, M.D.   FIRST ASSISTANT:  Currie Paris, M.D.   ANESTHESIA:  General endotracheal.   ESTIMATED BLOOD LOSS:  Minimal.   INDICATIONS FOR PROCEDURE:  Mr. Neece is a 76 year old white male who had an  obstructing colon carcinoma requiring a resection end colostomy October  2002. He underwent chemotherapy supervised by Dr. Riley Churches, had the  colostomy closed in July of 2003 and has done well since that time with no  evidence of recurrence.   Over the last year, he developed an increasing upper abdominal wall hernia.  I had spoken to him about trying to lose weight but he has really not been  very successful in this endeavor. He had a CT scan in September 2006 which  showed no recurrent hernia. It showed a broad based ventral hernia and did  show some small gallstones.   The patient now comes for attempted repair of his hernia laparoscopically. I  discussed with him the indications and potential complications. The  potential complications include but not limited to bleeding, infection,  bowel injury, the possibility of requiring open surgery.   DESCRIPTION OF PROCEDURE:  The patient placed in a supine position, given a  general anesthesia. He had both his arms tucked, a Foley catheter placed,  given 1 gram of Ancef at the  beginning of the procedure.   His abdomen was prepped with Betadine solution, sterilely draped and Ioban  draped placed over the abdomen.   I accessed the abdominal cavity through an Optiview in the right upper  quadrant and was able to enter the abdominal cavity without difficulty. This  was a 10 mm Optiview from [place]. I then placed three additional 5 mm  trocars, one in the right lower quadrant, a second one in the right lower  quadrant and one in the left lower quadrant. I spent approximately 90  minutes taking down adhesions to the anterior abdominal wall. These included  both omentum which was stuck to the anterior abdominal wall and small bowel.   There was this one area on the small bowel that had a small serosal tear,  but I do not think it went all the way through the wall of the small bowel.  When I released adhesion tear closed up nicely. I did not try to put any  sutures or material over the tear.   I then identified the  hernia defect on the anterior abdominal wall. This  measured approximately 13 x 17 cm and it was again based in his upper  abdomen. For appropriate mesh, it looks like I need a mesh approximately 20  x 27 cm. I used the Parietex mesh which was 20 x 30 cm and I cut this down  to 20 x 27 cm.   I then placed 8 sutures in a clockwise fashion in the mesh.  The sutures  were zero Novofil suture. I passed the Parietex intraabdominally and I  grabbed each of the sutures with an Endoclose.   I then tied the sutures down. There was some redundancy in the Parietex,  however, the patient had a large abdomen. I used approximately 45 staples  along the edges of the mesh to hold the mesh in place and to buttress the  mesh up against the anterior abdominal wall. At the completion of the  procedure, I then reexamined the mesh which lay well, there appeared to be  no space which was wide enough to worry about internal hernia. I relooked at  the bowel, the bowel itself  that I had seen was okay and was again covered  with omentum. I then removed the trocars in turn, there was no bleeding in  the trocar site. I closed the skin with a 5-0 Monocryl suture, painted each  one with tincture of Benzoin and Steri-Strips.   The patient tolerated the procedure well and was transported to the recovery  room in good condition. Sponge and needle count were correct at the end of  the case. Again, during the case, I spent 90 minutes doing enterolysis of  adhesions and this was in addition to repairing the abdominal wall hernia.      Sandria Bales. Ezzard Standing, M.D.  Electronically Signed     DHN/MEDQ  D:  04/19/2005  T:  04/19/2005  Job:  [id number]   cc:   Penni Bombard, MD  Fax: [phone]   Genene Churn. Cyndie Chime, M.D.  Fax: [phone]

## 2010-12-01 NOTE — Procedures (Signed)
[place]. [facility]  Patient:    MARKE, GOODWYN Visit Number: [id number] MRN: [id number]          Service Type: DSU Location: [facility] Attending Physician:  Andre Lefort Dictated by:   Sandria Bales. Ezzard Standing, M.D. Proc. Date: 05/28/01 Admit Date:  05/28/2001   CC:         Genene Churn. Cyndie Chime, M.D.   Procedure Report  DATE OF BIRTH:  03/25/1935  PROCEDURE:  Left subclavian [place]ort.  SURGEON:  Sandria Bales. Ezzard Standing, M.D.  FIRST ASSISTANT:  None.  ANESTHESIA:  MAC anesthesia with approximately 20 cc of 1% xylocaine.  INDICATIONS FOR PROCEDURE:  Mr. Nabor is a 76 year old white male who had a diverting colostomy for an obstructing colon cancer. He has seen Dr. Cyndie Chime for a proposed adjuvant therapy and needs IV access for this. Discussed with the patient indications and potential complications of the procedure to the patient. Now comes for attempted Port-A-Cath placement.  DESCRIPTION OF PROCEDURE:  The patient placed in supine position with a roll under his back, arms tucked to his side, given 1 gm of Ancef at the initiation of the procedure. His upper chest was shaved, prepped with Betadine solution and sterilely draped. A left subclavian vein was accessed with a 16 gauge needle and a guidewire threaded through this into the subclavian vein and superior vena cava, position was checked with fluoroscopy.  A reservoir was then developed in the upper aspect of the left chest ______ pocket. Silastic tube passed from the pocket to the subclavian stick site and then introduced into the subclavian vein using a 9.4 [place] introducer that came with the [employer].  The tip of the catheter was at the orifice to the right atrium, superior vena cava. I then attached the Silastic tubing to the reservoir, flushed the entire unit with concentrated heparin 100 units per cc that had previously been flushed with dilute heparin 10 units per cc. I used a bayonet  device to attach the tubing. It was then sewn in place using a 3-0 Vicryl suture then ______ in position both the tip of the catheter to the reservoir was checked to make sure there was no kink.  I then closed the skin with a 3-0 Vicryl suture and a 5-0 subcuticular suture in both sites.  I then painted it with tinctured benzoin and Steri-Strips though did not leave it access because he will not get his chemotherapy for a week but the whole unit is flushed. He already has Coumadin starting tomorrow 1 mg daily and he already had Vicodin tablets for pain. He should leave it bandaged for two days and return to see me in a weeks time.   Dictated by:   Sandria Bales. Ezzard Standing, M.D.  Attending Physician:  Andre Lefort DD:  05/28/01 TD:  05/28/01 Job: [id number]

## 2010-12-01 NOTE — Consult Note (Signed)
[place]. [facility]  Patient:    TREMEL, SETTERS Visit Number: [id number] MRN: [id number]          Service Type: MED Location: [id number] Attending Physician:  Phifer, Trinna Post Dictated by:   Lorette Ang, N.P. Proc. Date: 04/29/01 Admit Date:  04/23/2001   CC:         Dr. Jodelle Green ([place], [place])  Sandria Bales. Ezzard Standing, M.D.  Alvester Morin, M.D.  Genene Churn. Cyndie Chime, M.D.   Consultation Report  DATE OF BIRTH:  11/21/1934  REASON FOR CONSULTATION:  New diagnosis of colon cancer.  REFERRING PHYSICIAN:  Alvester Morin, M.D.  HISTORY OF PRESENT ILLNESS:  Mr. Cotten is a 76 year old man with a history of hypertension, diabetes mellitus type 2, and hyperlipidemia who presented to the emergency room on April 23, 2001, with complaints of abdominal pain, nausea/vomiting, and constipation. The patient reports a similar episode approximately 8 weeks ago which improved but did not entirely resolve. Since the initial episode, the patient has noticed increased flatulence as well as pain with bowel movements. He denies any hematochezia or melena.  ABDOMINAL FILMS:  On April 23, 2001, showed gaseous distention of both the large and small bowel with a question of pancreatitis and distal small bowel obstruction.  BARIUM ENEMA:  On April 25, 2001, revealed essential complete obstruction to retrograde flow of the lower descending colon at the level of the iliac crest.  ABDOMINAL/PELVIC CT:  Showed descending colon obstruction with question of an abnormal adjacent enlarged mesenteric node.  The patient underwent a left hemicolectomy with colostomy by Dr. Ovidio Kin on April 25, 2001. Final pathology [id number]) confirmed invasive adenocarcinoma with negative margins; 2 lymph nodes were noted to have metastatic carcinoma.  PAST MEDICAL HISTORY: 1. Hypertension. 2. Diabetes mellitus type 2. 3. Hyperlipidemia.  HOSPITAL MEDICATIONS: 1.  Unasyn 3 g IV q.6h. 2. Heparin 5000 units subcu q.12h. 3. Morphine PCA. 4. Protonix 40 mg daily. 5. K-Dur 20 mEq b.i.d. 6. Sliding scale insulin.  ALLERGIES:  No known drug allergies.  FAMILY HISTORY:  Mother deceased secondary to motor vehicle accident. Father deceased with a stroke. Brother deceased in a motor vehicle accident. There are 5 brothers and 2 sisters who are living. They have no reported health problems. The only family history of malignancy is in a paternal aunt who has a history of uterine cancer.  SOCIAL HISTORY:  Mr. Spiller [place]. He is divorced. He has 2 children (1 son and 1 daughter) who are both healthy. The patient was previously employed as a Chartered certified accountant. He does have a positive tobacco history reporting that he quit smoking approximately 12 years ago at 1-1/2 packs per day for 15 years. He denies EtOH use.  REVIEW OF SYSTEMS:  The patient reports an approximate 25-pound weight loss over the past 8 weeks. His appetite has been poor. He reports a mild decrease in his energy level. He has had no fever or night sweats. He denies any unusual headaches of vision changes. He has had no mouth sore. He does report progressive dyspnea over the past 2-3 years. He denies any cough or hemoptysis. He has had no chest pain. He denies any peripheral edema. GI:  As per history of present illness. The patient denies any hematuria or dysuria. He denies any rashes or skin changes.  PHYSICAL EXAMINATION:  GENERAL:  Pleasant, white male in no acute distress.  VITAL SIGNS:  Temperature 97, heart  rate 121, respirations 18, blood pressure 164/91.  HEENT:  Normocephalic, atraumatic. Pupils equal, round, and reactive to light. Extraocular movements are intact. Sclerae anicteric. Nares patent and without discharge. Oropharynx is clear except for a white coating over the tongue. The patient is edentulous.  LYMPHS:  No palpable, cervical,  supraclavicular, axillary or inguinal lymph nodes.  LUNGS:  Clear anteriorly.  CARDIOVASCULAR:  Regular, tachycardic.  ABDOMEN:  Midline incision with staples. Left colostomy intact.  EXTREMITIES:  Trace. Pretibial/pedal edema. No clubbing.  NEUROLOGIC:  Alert and oriented x3. Follows commands. Motor strength is 5/5. LABORATORY DATA:  Hemoglobin 9.2, white count 4.4, platelets 247,000, sodium 135, potassium 3.4, BUN 7, creatinine 1.0, glucose 97, calcium 7.4.  PREOPERATIVE LABORATORIES:  Hemoglobin 14.1, white count 5.4, platelets 383,000, MCV 91.1, sodium 139, potassium 3.9, BUN 28, creatinine 1.5, glucose 137, total bilirubin 0.9, alkaline phosphatase 34, SGOT 25, SGPT 17, total protein 8.2, albumin 4.0, calcium 9.5. Preoperative CEA: 2.9.  RADIOLOGY: 1. Chest x-ray, bibasilar atelectasis. 2. Abdomen/pelvic CT, descending colonic obstruction. Question abnormal    adjacent enlarged lymph node. Liver is homogeneous and attenuation without    focal abnormality. CT of the pelvis negative for adenopathy.  IMPRESSION/PLAN: 1. Mr. Hanken is a 76 year old man with newly diagnosed stage III (T3/N1,M0)    adenocarcinoma of the colon status post left hemicolectomy with colostomy.    ([facility]ukes C1.) Preoperative CEA was normal. 2. Mr. Heinz is a candidate for adjuvant chemotherapy with either    5-FU/leucovorin alone or on a clinical trial assessing 5-FU/leucovorin    with or without oxaliplatin (a drug recently approved for treatment    of colon cancer.) 3. The patient can be treated in [place], or if he prefers, at the    [facility] or by Dr. Mariel Sleet.  Dr. Cyndie Chime reviewed    the diagnosis and treatment options in detail with the patient, 2     sisters, and brother-in-law, and provided them with notes. 4. We will continue to follow with you in house and will arrange for    followup upon discharge. 5. The patient was seen and examined by Dr. Cyndie Chime. Labs and  x-rays    were reviewed. Dictated by:   Lorette Ang, N.P. Attending Physician:  Phifer, Trinna Post DD:  05/01/01 TD:  05/02/01 Job: [id number]

## 2010-12-01 NOTE — Op Note (Signed)
NAME:  Mark Le, Mark Le NO.:  [id number]   MEDICAL RECORD NO.:  [id number]          PATIENT TYPE:  AMB   LOCATION:  DSC                          FACILITY:  [facility]   PHYSICIAN:  Sandria Bales. Ezzard Standing, M.D.  DATE OF BIRTH:  01-Mar-1935   DATE OF PROCEDURE:  01/18/2005  DATE OF DISCHARGE:                                 OPERATIVE REPORT   PREOPERATIVE DIAGNOSIS:  Suture granuloma of abdominal.   POSTOPERATIVE DIAGNOSIS:  Suture granuloma with foreign body/suture removed.   OPERATION PERFORMED:  Removal of suture granuloma/foreign body.   SURGEON:  Sandria Bales. Ezzard Standing, M.D.   ANESTHESIA:  18 mL of 1% Xylocaine.   COMPLICATIONS:  None.   INDICATIONS FOR PROCEDURE:  [name] is a 76 year old black male who has  had a couple of laparotomies for colon cancer.  He has developed a chronic  draining sinus and now comes for exploration of this. I tried to probe this  in the office and have really been unsuccessful in finding or removing a  suture.   DESCRIPTION OF PROCEDURE:  The patient was placed in supine position. His  abdomen was prepped with Betadine solution and sterilely draped.  I  infiltrated about 16 to 18 mL of 1% Xylocaine around the draining tract.  I  then injected methylene blue.  I cut down on the tract.  The tract actually  comes out through the midline but actually tracks to the left side of the  abdomen.  I got to a cavity and retrieved a #1 Novofil suture.  I then  cleaned out the tract as best I could with a hemostat and packed it with  iodoform gauze.   He will start dressing changes at home two to three times a day. He will see  me back in two weeks for a wound check and hopefully this will take care of  this draining sinus tract.   Then at some point, he has an abdominal wall hernia that we will have to  repair.  We want this all healed before we go after the abdominal wall  hernia.       DHN/MEDQ  D:  01/18/2005  T:  01/18/2005  Job:  [id number]

## 2010-12-01 NOTE — Op Note (Signed)
NAME:  Mark Le, Mark Le NO.:  [id number]   MEDICAL RECORD NO.:  [id number]          PATIENT TYPE:  AMB   LOCATION:  ENDO                         FACILITY:  [facility]   PHYSICIAN:  Sandria Bales. Ezzard Standing, M.D.  DATE OF BIRTH:  [birth year]   DATE OF PROCEDURE:  05/17/2006  DATE OF DISCHARGE:                                 OPERATIVE REPORT   PREOPERATIVE DIAGNOSIS:  History of T3 N2 sigmoid colon carcinoma.   POSTOPERATIVE DIAGNOSES:  Normal anastomosis at 35 cm, with small polyps of  right colon and transverse colon, and two polyps in left colon, 50 to 60 cm  from anal verge.   PROCEDURE:  Colonoscopy with hot biopsy of polyps.   SURGEON:  Sandria Bales. Ezzard Standing, M.D.   ANESTHESIA:  Fentanyl 50 mcg and Versed 5 mg.   INDICATIONS FOR PROCEDURE:  [name] is a 76 year old white male, who had  an obstructing left/sigmoid colon carcinoma resected in October 2002.  So he  is now 5 years postop.  He had a T3 N2 carcinoma and underwent postop  chemotherapy supervised by Dr. Riley Churches in May 2003.   His last colonoscopy was in May 2004, so it has been 3 years.  He now comes  for followup colonoscopy.  He did have a temporary colostomy, which was  reversed in July 2003 without incident.  He understands the risks and  benefits of colonoscopy.   OPERATIVE NOTE:  The patient took a HalfLytely bowel prep.  He presented to  the endoscopy suite, where he was placed on pulse oximetry and EKG and blood  pressure cuff, and he had 2 L nasal O2 during the procedure.   He was given after anesthesia 50 mcg of fentanyl and 5 mg of Versed.   The patient was in the left lateral decubitus position.  A flexible Olympus  adult colonoscope was passed without difficulty around to his ileocecal  valve.  His ileocecal valve was visualized.  In the right colon, he had a  small 7 to 8-mm polyp, which was found and burned with the hot biopsy  forceps.  The scope was then withdrawn into the transverse  colon, where a  second 8 mm polyp was found.  This was about 80 cm from the anal verge, and  this was burned also with hot biopsy forceps.  The scope was then withdrawn  to approximately 50 to 60 cm, and 2 polyps were identified.  Each of these  polyps, again, were about 7 or 8 mm in diameter, and each were grabbed and  burned with electrocautery.   The scope was then withdrawn into the sigmoid colon.  The anastomosis was  visualized at 35 cm from the anal verge.  The remainder of his colonic exam  was unremarkable.  The scope was retroflexed within the rectum and a digital  rectal exam performed, and his rectum was unremarkable.   So the patient had a normal anastomosis.  I found 4 small, benign-appearing  polyps; however, pathology is pending at the time of this dictation.  The  patient  will probably be recommended the have a followup colonoscopy in 3  years, unless there is some significant abnormality in the polyps that I  biopsied.  He will see me back in 1 year for followup.      Sandria Bales. Ezzard Standing, M.D.  Electronically Signed     DHN/MEDQ  D:  05/17/2006  T:  05/18/2006  Job:  [id number]   cc:   Genene Churn. Cyndie Chime, M.D.  Penni Bombard, MD

## 2010-12-01 NOTE — Discharge Summary (Signed)
[place]. [facility]  Patient:    Mark Le, Mark Le Visit Number: [id number] MRN: [id number]          Service Type: SUR Location: [phone] Attending Physician:  Andre Lefort Dictated by:   Sandria Bales. Ezzard Standing, M.D. Admit Date:  01/19/2002 Discharge Date: 01/26/2002   CC:         Genene Churn. Cyndie Chime, M.D.  Emelda Fear, M.D.   Discharge Summary  DATE OF BIRTH:  10-23-2034  DISCHARGE DIAGNOSES: 1. Colostomy secondary to obstructing colon cancer, which is reversed. 2. Colon cancer T3, N1 carcinoma resected in October 2002. 3. Type 2 diabetes mellitus, diagnosed in 1999. 4. Hypertension. 5. Hyperlipidemia. 6. Abdominal wall hernias. 7. Extensive intra-abdominal adhesions.  OPERATIONS PERFORMED:  The patient had a closure of colostomy with enterolysis of adhesions and closure of abdominal wall hernias on 01/19/02.  HISTORY OF PRESENT ILLNESS:  The patient is a 76 year old white male who presented in October 2002, with obstructing left colon carcinoma.  He underwent a resection with an end colostomy and Hartmanns pouch.  He had  T3, N2 carcinoma with 2/4 lymph nodes positive.  He underwent postoperative chemotherapy supervised by Dr. Riley Churches.  Since his surgery and chemotherapy he has done well with no evidence of recurrent tumor.  He underwent a CT scan that showed some stable adrenal lesions, but no other suspicious mass.  He underwent a barium enema which showed an adequate length of distal colon for reanastomosing him, and he underwent colonoscopy on 12/05/01, which revealed several polyps which were removed, but no evidence of residual carcinoma.  The patient completed a mechanical and antibiotic bowel prep at home, and presented to the hospital on January 19, 2002.  HOSPITAL COURSE:  He was taken to the operating room where he underwent closure of his colostomy with end-to-end anastomosis, enterolysis of extensive intra-abdominal  adhesions, and closure and repair of abdominal wall hernias.  Postoperatively, he did well.  His Foley was removed on the first postoperative day.  His hemoglobin on the first postoperative day was 12, his hematocrit was 36, his potassium was 4.4.  He had a low-grade temperature which was attributed to atelectasis.  His capillary blood sugars stayed stable in the low 100s.  By the third postoperative day we removed his NG tube.  He started passing flatus or bowel movements on the fifth postoperative day.  He was restarted on his home medications.  He is now seven days postoperative.  He is afebrile, he is doing well, his bowels are functioning.  He will get a regular breakfast in the morning and may be discharged after that.  His final pathology report showed benign colon of the bowel that was resected, no residual adenomas, masses, or tumors.  I rechecked his CEA during this hospitalization which was less than 0.5.  DISCHARGE MEDICATIONS: 1. Vicodin for pain. 2. Multivitamin tablet. 3. Resume his home medications, which included Coumadin 1 mg q.d., Zocor 40 mg    q.d., Actos 45 mg q.d., Tricor 160 mg q.d., and Accupril 40 mg q.d.Marland Kitchen  ACTIVITY:  Do not drive for four days, no heavy lifting for four weeks.  He could shower.  He should wear the abdominal binder for about a month.  FOLLOWUP:  He will see me back in two weeks for follow-up appointment. Dictated by:   Sandria Bales. Ezzard Standing, M.D. Attending Physician:  Andre Lefort DD:  01/26/02 TD:  01/27/02 Job: [id number]

## 2010-12-01 NOTE — Op Note (Signed)
NAME:  Mark Le, Mark Le                            ACCOUNT NO.:  [id number]   MEDICAL RECORD NO.:  [id number]                   PATIENT TYPE:  AMB   LOCATION:  ENDO                                 FACILITY:  [facility]   PHYSICIAN:  Sandria Bales. Ezzard Standing, M.D.               DATE OF BIRTH:  May 18, 1935   DATE OF PROCEDURE:  11/26/2002  DATE OF DISCHARGE:                                 OPERATIVE REPORT   PREOPERATIVE DIAGNOSIS:  History of T3, N2 colon cancer.   POSTOPERATIVE DIAGNOSIS:  Normal anastomosis at 35 cm with no other mucosal  lesion.   PROCEDURE:  Colonoscopy.   SURGEON:  Sandria Bales. Ezzard Standing, M.D.   ANESTHESIA:  Demerol 50 mg, Versed 4 mg.   CLINICAL HISTORY:  The patient is a 76 year old male who had an obstructing  colon carcinoma resected in October 2002.  He had a T3, N2 carcinoma,  underwent postoperative chemotherapy by Genene Churn. Cyndie Chime, M.D., in May  2003.  I did a follow-up colonoscopy, in which I found two polyps.  One was  1.5 cm in size.  I reversed his colostomy in July 2003 without incident.  He  has been doing well from a colon standpoint since that time.   DESCRIPTION OF PROCEDURE:  He has undergone a GoLYTELY bowel prep.  He  presents to the endoscopy suite, is placed on pulse oximetry, EKG, and blood  pressure cuff, and has nasal O2.   For anesthesia he is given 50 mg of Demerol, 4 mg of Versed.   The patient had a flexible adult Olympus colonoscope passed without  difficulty up his rectum, through his anastomosis, around to his right  colon.  The right colon was identified, as was the ileocecal valve.  The  transverse colon and left colon were all unremarkable.  His anastomosis was  at 45 cm.  The majority of his left colon has been resected, so he had a  fairly straight sigmoid colon, which had no mucosal lesion.  The scope was  then withdrawn into the rectum, where it was retroflexed.   The patient tolerated the procedure well.  With negative colonoscopy,  I  think his next colonoscopy can be in three years' time unless he should have  some change in symptoms.   I spoke to his sister at the end of the procedure.                                                Sandria Bales. Ezzard Standing, M.D.    DHN/MEDQ  D:  11/26/2002  T:  11/26/2002  Job:  [id number]   cc:   Genene Churn. Cyndie Chime, M.D.  [address] [facility]  [place]  [place] [address]  Fax: [phone]   Silva Bandy  Katrinka Blazing, M.D.  Family Medicine Resident [id number]  Fax: [phone]

## 2010-12-01 NOTE — Discharge Summary (Signed)
[place]  Patient:    Mark Le, Mark Le Visit Number: [id number] MRN: [id number]          Service Type: MED Location: [phone] Attending Physician:  Phifer, Trinna Post Dictated by:   Emelda Fear, M.D. Admit Date:  04/23/2001 Discharge Date: 05/06/2001   CC:         Dr. Jodelle Green in [place], [place]  Sandria Bales. Ezzard Standing, M.D.  Genene Churn. Cyndie Chime, M.D.   Discharge Summary  DATE OF BIRTH:  Apr 19, 1935  CONSULTATIONS: 1. Sandria Bales. Ezzard Standing, M.D., general surgery. 2. Genene Churn. Cyndie Chime, M.D., oncology.  PROCEDURES: 1. Left hemicolectomy with colostomy placement by Dr. Ovidio Kin on    April 25, 2001. 2. Subclavian central line placement on right.  HISTORY OF PRESENT ILLNESS:  The patient is a 76 year old male with a history of hypertension, diabetes type 2 and hyperlipidemia who presented to the emergency room with complaints of abdominal pain, nausea, vomiting and constipation.  The patient reported a similar episode approximately eight weeks before presentation which improved but not entirely resolved.  Since the initial episode the patient had noticed increased flatulence as well as pain with bowel movements.  He denied any hematochezia or melena.  Abdominal films demonstrate a gaseous distention of both the large and small bowel with a question of pancreatitis and distal small bowel obstruction.  Barium enema revealed essential complete obstruction to retrograde flow of the lower descending colon at the level of the iliac crest.  Abdominal and pelvic CT revealed descending colonic obstruction with question of an abnormal adjacent enlarged mesenteric node.  The patient underwent a left hemicolectomy with colostomy by Dr. Ovidio Kin.  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  Two lymph nodes were noted to have metastatic carcinoma.  DISCHARGE MEDICATIONS: 1. Accupril 40 mg one tablet  p.o. q.d. 2. Tricor 160 mg one tablet p.o. q.d. 3. Actos 45 mg one tablet p.o. q.d. 4. Zocor 40 mg one tablet q.d. 5. Vicodin 5/500 one to two tablets q.4h. p.r.n. pain. 6. Phenergan 25 mg one tablet p.o. q.6h. p.r.n. nausea.  FOLLOWUP APPOINTMENTS: 1. Dr. Ezzard Standing on May 13, 2001 at 10:50 a.m. 2. Dr. Katrinka Blazing at [facility] on June 04, 2001 at 3 p.m. 3. Dr. Cyndie Chime on May 20, 2001.  PROBLEM LIST/DISCHARGE DIAGNOSES: #1 - STATUS POST LEFT HEMICOLECTOMY WITH COLOSTOMY BY DR. Onalee Hua [place]MAN:  The patient tolerated procedure well without complications.  The patient advanced diet without difficulty during hospitalization.  The patient received adequate pain medication with a morphine PCA pump and then Vicodin p.r.n. to follow. The patient received colostomy turning from colostomy nurse while in-house. The patient had suffered a wound infection while in-house and it was followed with frequent dressing changes.  Home health will perform dressing changes b.i.d. for the upcoming two weeks that the patient is home.  #2 - MUCINOUS ADENOCARCINOMA STAGE III:  The patient is a candidate for adjuvant chemotherapy with either 5-FU or leucovorin.  The patient will be followed up with Dr. Cyndie Chime in regards to chemotherapy once discharged.  #3 - PROGRESSIVE DYSPNEA:  The patient reports a three- to four-month history of progressive dyspnea on exertion.  Spiral CT of chest did not reveal presence of a pulmonary embolism.  Echocardiogram revealed an ejection fraction of 65-75%.  Left ventricular wall thickness was within normal limits. Estimated peak right ventricular systolic pressure was moderately increased. No evidence on echo to suggest etiology for dyspnea.  EKG during admission remained within normal limits and unchanged.  The patient may benefit from pulmonary function tests as an outpatient.  #4 - ANEMIA SECONDARY TO ACUTE BLOOD LOSS FROM SURGERY:  The  patients hemoglobin remained stable throughout admission.  Should have hemoglobin reevaluated in the upcoming months as an outpatient.  Discharge hemoglobin 10.3, discharge hematocrit 30.1.  #5 - HYPOKALEMIA:  Potassium was replaced throughout admission.  Magnesium level was within normal limits during admission.  We will not discharge the patient on potassium however, recommend followup in upcoming one to two weeks.  #6 - HYPERTENSION:  Well controlled on Accupril therapy.  We will continue home medication.  #7 - DIABETES MELLITUS:  Stable.  We will continue home regimen of Actos 45 mg q.d.  #8 - HYPERLIPIDEMIA:  Recommend patient resume Tricor and Zocor therapies upon discharge.  DISCHARGE LABORATORIES:  Sodium 138, potassium 4.1, chloride 102, carbon dioxide 29, glucose 113, BUN 3, creatinine 1.0, calcium 8.7.  White blood cells 5.0, hemoglobin 10.3, hematocrit 30.1, MCV 91.6, platelet count 498. Total protein 4.8, albumin 1.6, AST 22, ALT 12, alkaline phosphatase 18, total bilirubin 1.1.  TSH 1.804.  CEA 2.9.  Blood cultures negative for five days. Blood culture tube contaminant with staph coagulase-negative consistent with Staphylococcus epidermis.  DISCHARGE ACTIVITY:  Recommend the patient not lift anything over 20 pounds for the next four weeks.  DIET:  Recommend low salt diabetic diet.  WOUND CARE:  Home health nurse to change dressing on wound b.i.d. for upcoming weeks.  SPECIAL INSTRUCTIONS:  Recommend the patient call primary care physician or [facility] for the development of fever, increased incisional pain or increased incisional drainage. Dictated by:   Emelda Fear, M.D. Attending Physician:  Phifer, Trinna Post DD:  05/06/01 TD:  05/08/01 Job: [id number]

## 2011-05-02 ENCOUNTER — Emergency Department (HOSPITAL_COMMUNITY)
Admission: EM | Admit: 2011-05-02 | Discharge: 2011-05-02 | Disposition: A | Discharge status: Home / Self Care | Payer: Medicare Other | Attending: Emergency Medicine | Admitting: Emergency Medicine

## 2011-05-02 ENCOUNTER — Emergency Department (HOSPITAL_COMMUNITY): Payer: Medicare Other

## 2011-05-02 DIAGNOSIS — Z7982 Long term (current) use of aspirin: Secondary | ICD-10-CM | POA: Insufficient documentation

## 2011-05-02 DIAGNOSIS — N289 Disorder of kidney and ureter, unspecified: Secondary | ICD-10-CM | POA: Insufficient documentation

## 2011-05-02 DIAGNOSIS — Z85038 Personal history of other malignant neoplasm of large intestine: Secondary | ICD-10-CM | POA: Insufficient documentation

## 2011-05-02 DIAGNOSIS — Z79899 Other long term (current) drug therapy: Secondary | ICD-10-CM | POA: Insufficient documentation

## 2011-05-02 DIAGNOSIS — I10 Essential (primary) hypertension: Secondary | ICD-10-CM | POA: Insufficient documentation

## 2011-05-02 DIAGNOSIS — E119 Type 2 diabetes mellitus without complications: Secondary | ICD-10-CM | POA: Insufficient documentation

## 2011-05-02 DIAGNOSIS — E7800 Pure hypercholesterolemia, unspecified: Secondary | ICD-10-CM | POA: Insufficient documentation

## 2011-05-02 DIAGNOSIS — R82998 Other abnormal findings in urine: Secondary | ICD-10-CM | POA: Insufficient documentation

## 2011-05-02 LAB — URINE MICROSCOPIC-ADD ON

## 2011-05-02 LAB — DIFFERENTIAL
Basophils Absolute: 0 10*3/uL (ref 0.0–0.1)
Basophils Relative: 0 % (ref 0–1)
Eosinophils Absolute: 0.2 10*3/uL (ref 0.0–0.7)
Monocytes Absolute: 0.8 10*3/uL (ref 0.1–1.0)
Neutro Abs: 6.8 10*3/uL (ref 1.7–7.7)
Neutrophils Relative %: 70 % (ref 43–77)

## 2011-05-02 LAB — CBC
Hemoglobin: 15.2 g/dL (ref 13.0–17.0)
MCHC: 35.5 g/dL (ref 30.0–36.0)
Platelets: 376 10*3/uL (ref 150–400)

## 2011-05-02 LAB — COMPREHENSIVE METABOLIC PANEL
ALT: 66 U/L — ABNORMAL HIGH (ref 0–53)
AST: 81 U/L — ABNORMAL HIGH (ref 0–37)
Albumin: 4 g/dL (ref 3.5–5.2)
CO2: 22 mEq/L (ref 19–32)
Calcium: 10.5 mg/dL (ref 8.4–10.5)
Sodium: 138 mEq/L (ref 135–145)
Total Protein: 9.1 g/dL — ABNORMAL HIGH (ref 6.0–8.3)

## 2011-05-02 LAB — URINALYSIS, ROUTINE W REFLEX MICROSCOPIC
Glucose, UA: NEGATIVE mg/dL
Leukocytes, UA: NEGATIVE
Specific Gravity, Urine: 1.022 (ref 1.005–1.030)
pH: 5 (ref 5.0–8.0)

## 2011-05-02 LAB — CK: Total CK: 314 U/L — ABNORMAL HIGH (ref 7–232)

## 2011-05-28 ENCOUNTER — Other Ambulatory Visit (HOSPITAL_BASED_OUTPATIENT_CLINIC_OR_DEPARTMENT_OTHER): Payer: Medicare Other

## 2011-05-28 ENCOUNTER — Telehealth: Payer: Self-pay | Admitting: Oncology

## 2011-05-28 ENCOUNTER — Other Ambulatory Visit: Payer: Self-pay | Admitting: Oncology

## 2011-05-28 DIAGNOSIS — C189 Malignant neoplasm of colon, unspecified: Secondary | ICD-10-CM

## 2011-05-28 LAB — COMPREHENSIVE METABOLIC PANEL
Alkaline Phosphatase: 52 U/L (ref 39–117)
CO2: 25 mEq/L (ref 19–32)
Creatinine, Ser: 1.94 mg/dL — ABNORMAL HIGH (ref 0.50–1.35)
Glucose, Bld: 116 mg/dL — ABNORMAL HIGH (ref 70–99)
Sodium: 144 mEq/L (ref 135–145)
Total Bilirubin: 1 mg/dL (ref 0.3–1.2)
Total Protein: 7.4 g/dL (ref 6.0–8.3)

## 2011-05-28 LAB — CBC WITH DIFFERENTIAL/PLATELET
BASO%: 0.7 % (ref 0.0–2.0)
Eosinophils Absolute: 0.1 10*3/uL (ref 0.0–0.5)
HCT: 40.8 % (ref 38.4–49.9)
LYMPH%: 36.7 % (ref 14.0–49.0)
MCHC: 34.2 g/dL (ref 32.0–36.0)
MCV: 95.3 fL (ref 79.3–98.0)
MONO%: 8.1 % (ref 0.0–14.0)
NEUT%: 51.1 % (ref 39.0–75.0)
Platelets: 179 10*3/uL (ref 140–400)
RBC: 4.29 10*6/uL (ref 4.20–5.82)

## 2011-05-28 LAB — LACTATE DEHYDROGENASE: LDH: 114 U/L (ref 94–250)

## 2011-05-28 LAB — CEA: CEA: 0.8 ng/mL (ref 0.0–5.0)

## 2011-05-28 NOTE — Telephone Encounter (Signed)
Talked to pt gave him appt date for January,MD  appt was r/s from 11/19 due to EPIC.

## 2011-06-04 DIAGNOSIS — M109 Gout, unspecified: Secondary | ICD-10-CM | POA: Insufficient documentation

## 2011-06-04 HISTORY — DX: Gout, unspecified: M10.9

## 2011-06-06 ENCOUNTER — Telehealth: Payer: Self-pay | Admitting: *Deleted

## 2011-06-06 NOTE — Telephone Encounter (Signed)
Notified pt. per Dr. Cyndie Chime that lab done 05/28/11 shows decreased kidney function & lab report will be electronically sent to Dr. Timothy Lasso [place].

## 2011-07-24 ENCOUNTER — Encounter: Payer: Self-pay | Admitting: Oncology

## 2011-07-24 ENCOUNTER — Ambulatory Visit (HOSPITAL_BASED_OUTPATIENT_CLINIC_OR_DEPARTMENT_OTHER): Payer: Medicare Other | Admitting: Oncology

## 2011-07-24 DIAGNOSIS — E119 Type 2 diabetes mellitus without complications: Secondary | ICD-10-CM

## 2011-07-24 DIAGNOSIS — E785 Hyperlipidemia, unspecified: Secondary | ICD-10-CM | POA: Diagnosis not present

## 2011-07-24 DIAGNOSIS — Z85038 Personal history of other malignant neoplasm of large intestine: Secondary | ICD-10-CM

## 2011-07-24 DIAGNOSIS — K7689 Other specified diseases of liver: Secondary | ICD-10-CM

## 2011-07-24 DIAGNOSIS — R7402 Elevation of levels of lactic acid dehydrogenase (LDH): Secondary | ICD-10-CM

## 2011-07-24 DIAGNOSIS — I10 Essential (primary) hypertension: Secondary | ICD-10-CM

## 2011-07-24 DIAGNOSIS — K439 Ventral hernia without obstruction or gangrene: Secondary | ICD-10-CM

## 2011-07-24 HISTORY — DX: Ventral hernia without obstruction or gangrene: K43.9

## 2011-07-24 NOTE — Progress Notes (Signed)
Hematology and Oncology Follow Up Visit  Mark Le [id number] March 17, 1935 77 y.o. 07/24/2011 7:57 PM   Principle Diagnosis: Encounter Diagnoses  Name Primary?  . History of colon cancer, stage III yes  . Type II Diabetes   . Gout   . Ventral hernia      Interim History:   It is hard to believe that [name] is now 77 years old and out 10 years from diagnosis of a stage III, node positive perforated cancer of the descending colon diagnosed in October of 2002. He required a temporary colostomy which was subsequently reversed. He had a normal preop CEA. He received adjuvant chemotherapy with 5-FU leucovorin. He has remained free of any new disease since that time. Most recent colonoscopy done 08/08/2009 with findings of 2 benign tubular adenomas which were removed. He tells me back in late October he developed sudden onset of painful swelling of both of his feet and went to the emergency department in [place]. He subsequently saw his primary care physician. It was felt that this might be atypical gout. I don't see any record of a uric acid. He was treated symptomatically and pain has resolved. He did lose a lot of weight when he was having the pain. He has regained most of this.  He denies any abdominal pain or cramping no hematochezia or melena.  Medications: reviewed  Allergies:  Allergies  Allergen Reactions  . Iohexol      Code: HIVES, Desc: pt developed hives on arms, neck and chest after iv injection for ct scan, Onset Date: [id number]     Review of Systems: Constitutional:  See above weight currently 250 pounds down from 270 pounds in September 2009 but he states up from 235 during a recent episode with his feet. Respiratory: No dyspnea no cough Cardiovascular:  No ischemic type chest pain or palpitations Gastrointestinal: See above Genito-Urinary: No urinary tract symptoms Musculoskeletal: No bone pain Neurologic: No headache or change in vision Skin: No rash Remaining ROS  negative.  Physical Exam: Blood pressure 153/81, pulse 81, temperature 97.2 F (36.2 C), temperature source Oral, height 6\' 1"  (1.854 m), weight 249 lb 11.2 oz (113.263 kg). Wt Readings from Last 3 Encounters:  07/24/11 249 lb 11.2 oz (113.263 kg)  03/16/08 270 lb 8 oz (122.698 kg)  01/21/08 275 lb (124.739 kg)     General appearance: Well-nourished Head: normal Neck: Full range of motion Lymph nodes: No adenopathy Breasts: Lungs: Clear to auscultation resonant to percussion Heart: Regular rhythm no murmur Abdomen: Soft nontender no mass no organomegaly large ventral hernia Extremities: No edema no calf tenderness Vascular: No cyanosis Neurologic: No focal deficits Skin: No rash or ecchymosis  Lab Results: Lab Results  Component Value Date   WBC 3.9* 05/28/2011   HGB 14.0 05/28/2011   HCT 40.8 05/28/2011   MCV 95.3 05/28/2011   PLT 179 05/28/2011     Chemistry      Component Value Date/Time   NA 144 05/28/2011 1426   NA 144 05/28/2011 1426   K 4.6 05/28/2011 1426   K 4.6 05/28/2011 1426   CL 103 05/28/2011 1426   CL 103 05/28/2011 1426   CO2 25 05/28/2011 1426   CO2 25 05/28/2011 1426   BUN 30* 05/28/2011 1426   BUN 30* 05/28/2011 1426   CREATININE 1.94* 05/28/2011 1426   CREATININE 1.94* 05/28/2011 1426      Component Value Date/Time   CALCIUM 10.5 05/28/2011 1426   CALCIUM 10.5 05/28/2011 1426  ALKPHOS 52 05/28/2011 1426   ALKPHOS 52 05/28/2011 1426   AST 28 05/28/2011 1426   AST 28 05/28/2011 1426   ALT 26 05/28/2011 1426   ALT 26 05/28/2011 1426   BILITOT 1.0 05/28/2011 1426   BILITOT 1.0 05/28/2011 1426       Radiological Studies: No results found.   Impression and Plan: #1 d stage III colon cancer. He remains free of recurrence now out over 10 years. I told him he could graduate from our practice at this time.  #2. Type 2 diabetes  #3. Essential hypertension  #4. Hyperlipidemia with fatty liver  #5. Intermittent transaminase  elevation secondary to #4.  #6. Possible gouty arthritis.   Spent more than half the time coordinating care.    Levert Feinstein, MD 1/8/20137:57 PM

## 2011-08-09 ENCOUNTER — Encounter (INDEPENDENT_AMBULATORY_CARE_PROVIDER_SITE_OTHER): Payer: Self-pay | Admitting: Surgery

## 2011-08-16 DIAGNOSIS — E119 Type 2 diabetes mellitus without complications: Secondary | ICD-10-CM | POA: Diagnosis not present

## 2011-08-16 DIAGNOSIS — I10 Essential (primary) hypertension: Secondary | ICD-10-CM | POA: Diagnosis not present

## 2011-09-13 DIAGNOSIS — I10 Essential (primary) hypertension: Secondary | ICD-10-CM | POA: Diagnosis not present

## 2011-09-13 DIAGNOSIS — E119 Type 2 diabetes mellitus without complications: Secondary | ICD-10-CM | POA: Diagnosis not present

## 2011-09-27 DIAGNOSIS — E119 Type 2 diabetes mellitus without complications: Secondary | ICD-10-CM | POA: Diagnosis not present

## 2011-11-13 DIAGNOSIS — E119 Type 2 diabetes mellitus without complications: Secondary | ICD-10-CM | POA: Diagnosis not present

## 2011-11-13 DIAGNOSIS — I10 Essential (primary) hypertension: Secondary | ICD-10-CM | POA: Diagnosis not present

## 2012-01-09 DIAGNOSIS — R634 Abnormal weight loss: Secondary | ICD-10-CM | POA: Diagnosis not present

## 2012-01-09 DIAGNOSIS — M79609 Pain in unspecified limb: Secondary | ICD-10-CM | POA: Diagnosis not present

## 2012-01-09 DIAGNOSIS — E119 Type 2 diabetes mellitus without complications: Secondary | ICD-10-CM | POA: Diagnosis not present

## 2012-01-09 DIAGNOSIS — I10 Essential (primary) hypertension: Secondary | ICD-10-CM | POA: Diagnosis not present

## 2012-01-09 DIAGNOSIS — E785 Hyperlipidemia, unspecified: Secondary | ICD-10-CM | POA: Diagnosis not present

## 2012-01-11 DIAGNOSIS — I10 Essential (primary) hypertension: Secondary | ICD-10-CM | POA: Diagnosis not present

## 2012-01-11 DIAGNOSIS — E785 Hyperlipidemia, unspecified: Secondary | ICD-10-CM | POA: Diagnosis not present

## 2012-01-11 DIAGNOSIS — M79609 Pain in unspecified limb: Secondary | ICD-10-CM | POA: Diagnosis not present

## 2012-01-11 DIAGNOSIS — E119 Type 2 diabetes mellitus without complications: Secondary | ICD-10-CM | POA: Diagnosis not present

## 2012-01-11 DIAGNOSIS — R634 Abnormal weight loss: Secondary | ICD-10-CM | POA: Diagnosis not present

## 2012-02-06 DIAGNOSIS — E119 Type 2 diabetes mellitus without complications: Secondary | ICD-10-CM | POA: Diagnosis not present

## 2012-02-06 DIAGNOSIS — E785 Hyperlipidemia, unspecified: Secondary | ICD-10-CM | POA: Diagnosis not present

## 2012-02-06 DIAGNOSIS — I10 Essential (primary) hypertension: Secondary | ICD-10-CM | POA: Diagnosis not present

## 2012-02-14 DIAGNOSIS — E785 Hyperlipidemia, unspecified: Secondary | ICD-10-CM | POA: Diagnosis not present

## 2012-02-14 DIAGNOSIS — I10 Essential (primary) hypertension: Secondary | ICD-10-CM | POA: Diagnosis not present

## 2012-02-14 DIAGNOSIS — E119 Type 2 diabetes mellitus without complications: Secondary | ICD-10-CM | POA: Diagnosis not present

## 2012-03-19 DIAGNOSIS — I10 Essential (primary) hypertension: Secondary | ICD-10-CM | POA: Diagnosis not present

## 2012-03-19 DIAGNOSIS — E119 Type 2 diabetes mellitus without complications: Secondary | ICD-10-CM | POA: Diagnosis not present

## 2012-03-19 DIAGNOSIS — N189 Chronic kidney disease, unspecified: Secondary | ICD-10-CM | POA: Diagnosis not present

## 2012-03-19 DIAGNOSIS — E785 Hyperlipidemia, unspecified: Secondary | ICD-10-CM | POA: Diagnosis not present

## 2012-03-20 DIAGNOSIS — Z23 Encounter for immunization: Secondary | ICD-10-CM | POA: Diagnosis not present

## 2012-05-28 DIAGNOSIS — E785 Hyperlipidemia, unspecified: Secondary | ICD-10-CM | POA: Diagnosis not present

## 2012-05-28 DIAGNOSIS — E119 Type 2 diabetes mellitus without complications: Secondary | ICD-10-CM | POA: Diagnosis not present

## 2012-06-04 DIAGNOSIS — I10 Essential (primary) hypertension: Secondary | ICD-10-CM | POA: Diagnosis not present

## 2012-06-04 DIAGNOSIS — E119 Type 2 diabetes mellitus without complications: Secondary | ICD-10-CM | POA: Diagnosis not present

## 2012-06-04 DIAGNOSIS — E785 Hyperlipidemia, unspecified: Secondary | ICD-10-CM | POA: Diagnosis not present

## 2012-08-20 DIAGNOSIS — E782 Mixed hyperlipidemia: Secondary | ICD-10-CM | POA: Diagnosis not present

## 2012-08-20 DIAGNOSIS — E119 Type 2 diabetes mellitus without complications: Secondary | ICD-10-CM | POA: Diagnosis not present

## 2012-08-20 DIAGNOSIS — I10 Essential (primary) hypertension: Secondary | ICD-10-CM | POA: Diagnosis not present

## 2012-11-21 DIAGNOSIS — I10 Essential (primary) hypertension: Secondary | ICD-10-CM | POA: Diagnosis not present

## 2012-11-21 DIAGNOSIS — K219 Gastro-esophageal reflux disease without esophagitis: Secondary | ICD-10-CM | POA: Diagnosis not present

## 2012-11-21 DIAGNOSIS — E785 Hyperlipidemia, unspecified: Secondary | ICD-10-CM | POA: Diagnosis not present

## 2012-11-21 DIAGNOSIS — E119 Type 2 diabetes mellitus without complications: Secondary | ICD-10-CM | POA: Diagnosis not present

## 2012-11-21 DIAGNOSIS — E782 Mixed hyperlipidemia: Secondary | ICD-10-CM | POA: Diagnosis not present

## 2012-12-18 DIAGNOSIS — R944 Abnormal results of kidney function studies: Secondary | ICD-10-CM | POA: Diagnosis not present

## 2013-02-20 DIAGNOSIS — R634 Abnormal weight loss: Secondary | ICD-10-CM | POA: Diagnosis not present

## 2013-02-20 DIAGNOSIS — E782 Mixed hyperlipidemia: Secondary | ICD-10-CM | POA: Diagnosis not present

## 2013-02-20 DIAGNOSIS — I10 Essential (primary) hypertension: Secondary | ICD-10-CM | POA: Diagnosis not present

## 2013-02-20 DIAGNOSIS — N182 Chronic kidney disease, stage 2 (mild): Secondary | ICD-10-CM | POA: Diagnosis not present

## 2013-02-20 DIAGNOSIS — E1129 Type 2 diabetes mellitus with other diabetic kidney complication: Secondary | ICD-10-CM | POA: Diagnosis not present

## 2013-02-20 DIAGNOSIS — E119 Type 2 diabetes mellitus without complications: Secondary | ICD-10-CM | POA: Diagnosis not present

## 2013-02-20 DIAGNOSIS — M79609 Pain in unspecified limb: Secondary | ICD-10-CM | POA: Diagnosis not present

## 2013-05-27 DIAGNOSIS — I10 Essential (primary) hypertension: Secondary | ICD-10-CM | POA: Diagnosis not present

## 2013-05-27 DIAGNOSIS — E119 Type 2 diabetes mellitus without complications: Secondary | ICD-10-CM | POA: Diagnosis not present

## 2013-05-29 DIAGNOSIS — Z23 Encounter for immunization: Secondary | ICD-10-CM | POA: Diagnosis not present

## 2013-05-29 DIAGNOSIS — I10 Essential (primary) hypertension: Secondary | ICD-10-CM | POA: Diagnosis not present

## 2013-05-29 DIAGNOSIS — E1129 Type 2 diabetes mellitus with other diabetic kidney complication: Secondary | ICD-10-CM | POA: Diagnosis not present

## 2013-06-09 DIAGNOSIS — J069 Acute upper respiratory infection, unspecified: Secondary | ICD-10-CM | POA: Diagnosis not present

## 2013-08-11 DIAGNOSIS — E119 Type 2 diabetes mellitus without complications: Secondary | ICD-10-CM | POA: Diagnosis not present

## 2013-08-11 DIAGNOSIS — Z794 Long term (current) use of insulin: Secondary | ICD-10-CM | POA: Diagnosis not present

## 2013-08-19 DIAGNOSIS — J040 Acute laryngitis: Secondary | ICD-10-CM | POA: Diagnosis not present

## 2013-09-09 DIAGNOSIS — E1129 Type 2 diabetes mellitus with other diabetic kidney complication: Secondary | ICD-10-CM | POA: Diagnosis not present

## 2013-09-09 DIAGNOSIS — N182 Chronic kidney disease, stage 2 (mild): Secondary | ICD-10-CM | POA: Diagnosis not present

## 2013-09-09 DIAGNOSIS — E785 Hyperlipidemia, unspecified: Secondary | ICD-10-CM | POA: Diagnosis not present

## 2013-09-09 DIAGNOSIS — R946 Abnormal results of thyroid function studies: Secondary | ICD-10-CM | POA: Diagnosis not present

## 2013-09-09 DIAGNOSIS — E782 Mixed hyperlipidemia: Secondary | ICD-10-CM | POA: Diagnosis not present

## 2013-09-09 DIAGNOSIS — I10 Essential (primary) hypertension: Secondary | ICD-10-CM | POA: Diagnosis not present

## 2013-12-09 DIAGNOSIS — E119 Type 2 diabetes mellitus without complications: Secondary | ICD-10-CM | POA: Diagnosis not present

## 2013-12-09 DIAGNOSIS — I10 Essential (primary) hypertension: Secondary | ICD-10-CM | POA: Diagnosis not present

## 2013-12-11 DIAGNOSIS — E1129 Type 2 diabetes mellitus with other diabetic kidney complication: Secondary | ICD-10-CM | POA: Diagnosis not present

## 2013-12-11 DIAGNOSIS — I10 Essential (primary) hypertension: Secondary | ICD-10-CM | POA: Diagnosis not present

## 2013-12-11 DIAGNOSIS — E785 Hyperlipidemia, unspecified: Secondary | ICD-10-CM | POA: Diagnosis not present

## 2013-12-11 DIAGNOSIS — N182 Chronic kidney disease, stage 2 (mild): Secondary | ICD-10-CM | POA: Diagnosis not present

## 2014-04-09 DIAGNOSIS — I10 Essential (primary) hypertension: Secondary | ICD-10-CM | POA: Diagnosis not present

## 2014-04-09 DIAGNOSIS — E119 Type 2 diabetes mellitus without complications: Secondary | ICD-10-CM | POA: Diagnosis not present

## 2014-04-13 DIAGNOSIS — I10 Essential (primary) hypertension: Secondary | ICD-10-CM | POA: Diagnosis not present

## 2014-04-13 DIAGNOSIS — Z23 Encounter for immunization: Secondary | ICD-10-CM | POA: Diagnosis not present

## 2014-04-13 DIAGNOSIS — E1165 Type 2 diabetes mellitus with hyperglycemia: Secondary | ICD-10-CM | POA: Diagnosis not present

## 2014-04-13 DIAGNOSIS — R0602 Shortness of breath: Secondary | ICD-10-CM | POA: Diagnosis not present

## 2014-04-13 DIAGNOSIS — Z0000 Encounter for general adult medical examination without abnormal findings: Secondary | ICD-10-CM | POA: Diagnosis not present

## 2014-04-13 DIAGNOSIS — E785 Hyperlipidemia, unspecified: Secondary | ICD-10-CM | POA: Diagnosis not present

## 2014-04-13 DIAGNOSIS — E1129 Type 2 diabetes mellitus with other diabetic kidney complication: Secondary | ICD-10-CM | POA: Diagnosis not present

## 2014-04-14 DIAGNOSIS — E1165 Type 2 diabetes mellitus with hyperglycemia: Secondary | ICD-10-CM | POA: Diagnosis not present

## 2014-04-14 DIAGNOSIS — I10 Essential (primary) hypertension: Secondary | ICD-10-CM | POA: Diagnosis not present

## 2014-04-14 DIAGNOSIS — E785 Hyperlipidemia, unspecified: Secondary | ICD-10-CM | POA: Diagnosis not present

## 2014-04-14 DIAGNOSIS — E1129 Type 2 diabetes mellitus with other diabetic kidney complication: Secondary | ICD-10-CM | POA: Diagnosis not present

## 2014-04-14 DIAGNOSIS — Z0000 Encounter for general adult medical examination without abnormal findings: Secondary | ICD-10-CM | POA: Diagnosis not present

## 2014-04-14 DIAGNOSIS — R0602 Shortness of breath: Secondary | ICD-10-CM | POA: Diagnosis not present

## 2014-08-10 DIAGNOSIS — E785 Hyperlipidemia, unspecified: Secondary | ICD-10-CM | POA: Diagnosis not present

## 2014-08-10 DIAGNOSIS — I10 Essential (primary) hypertension: Secondary | ICD-10-CM | POA: Diagnosis not present

## 2014-08-10 DIAGNOSIS — E119 Type 2 diabetes mellitus without complications: Secondary | ICD-10-CM | POA: Diagnosis not present

## 2014-08-12 DIAGNOSIS — E782 Mixed hyperlipidemia: Secondary | ICD-10-CM | POA: Diagnosis not present

## 2014-08-12 DIAGNOSIS — I10 Essential (primary) hypertension: Secondary | ICD-10-CM | POA: Diagnosis not present

## 2014-08-12 DIAGNOSIS — E1122 Type 2 diabetes mellitus with diabetic chronic kidney disease: Secondary | ICD-10-CM | POA: Diagnosis not present

## 2014-08-17 DIAGNOSIS — H2513 Age-related nuclear cataract, bilateral: Secondary | ICD-10-CM | POA: Diagnosis not present

## 2014-08-17 DIAGNOSIS — E119 Type 2 diabetes mellitus without complications: Secondary | ICD-10-CM | POA: Diagnosis not present

## 2014-08-17 DIAGNOSIS — Z794 Long term (current) use of insulin: Secondary | ICD-10-CM | POA: Diagnosis not present

## 2014-12-02 DIAGNOSIS — I10 Essential (primary) hypertension: Secondary | ICD-10-CM | POA: Diagnosis not present

## 2014-12-02 DIAGNOSIS — E1122 Type 2 diabetes mellitus with diabetic chronic kidney disease: Secondary | ICD-10-CM | POA: Diagnosis not present

## 2014-12-07 DIAGNOSIS — E782 Mixed hyperlipidemia: Secondary | ICD-10-CM | POA: Diagnosis not present

## 2014-12-07 DIAGNOSIS — N183 Chronic kidney disease, stage 3 (moderate): Secondary | ICD-10-CM | POA: Diagnosis not present

## 2014-12-07 DIAGNOSIS — E1122 Type 2 diabetes mellitus with diabetic chronic kidney disease: Secondary | ICD-10-CM | POA: Diagnosis not present

## 2014-12-07 DIAGNOSIS — I10 Essential (primary) hypertension: Secondary | ICD-10-CM | POA: Diagnosis not present

## 2015-03-09 DIAGNOSIS — E1122 Type 2 diabetes mellitus with diabetic chronic kidney disease: Secondary | ICD-10-CM | POA: Diagnosis not present

## 2015-03-09 DIAGNOSIS — I10 Essential (primary) hypertension: Secondary | ICD-10-CM | POA: Diagnosis not present

## 2015-03-09 DIAGNOSIS — E782 Mixed hyperlipidemia: Secondary | ICD-10-CM | POA: Diagnosis not present

## 2015-03-11 DIAGNOSIS — N183 Chronic kidney disease, stage 3 (moderate): Secondary | ICD-10-CM | POA: Diagnosis not present

## 2015-03-11 DIAGNOSIS — Z23 Encounter for immunization: Secondary | ICD-10-CM | POA: Diagnosis not present

## 2015-03-11 DIAGNOSIS — I10 Essential (primary) hypertension: Secondary | ICD-10-CM | POA: Diagnosis not present

## 2015-03-11 DIAGNOSIS — E785 Hyperlipidemia, unspecified: Secondary | ICD-10-CM | POA: Diagnosis not present

## 2015-03-11 DIAGNOSIS — E1122 Type 2 diabetes mellitus with diabetic chronic kidney disease: Secondary | ICD-10-CM | POA: Diagnosis not present

## 2015-07-12 DIAGNOSIS — Z125 Encounter for screening for malignant neoplasm of prostate: Secondary | ICD-10-CM | POA: Diagnosis not present

## 2015-07-12 DIAGNOSIS — E1122 Type 2 diabetes mellitus with diabetic chronic kidney disease: Secondary | ICD-10-CM | POA: Diagnosis not present

## 2015-07-14 DIAGNOSIS — E1122 Type 2 diabetes mellitus with diabetic chronic kidney disease: Secondary | ICD-10-CM | POA: Diagnosis not present

## 2015-07-14 DIAGNOSIS — N183 Chronic kidney disease, stage 3 (moderate): Secondary | ICD-10-CM | POA: Diagnosis not present

## 2015-07-14 DIAGNOSIS — I10 Essential (primary) hypertension: Secondary | ICD-10-CM | POA: Diagnosis not present

## 2015-07-14 DIAGNOSIS — E782 Mixed hyperlipidemia: Secondary | ICD-10-CM | POA: Diagnosis not present

## 2015-10-19 DIAGNOSIS — E1122 Type 2 diabetes mellitus with diabetic chronic kidney disease: Secondary | ICD-10-CM | POA: Diagnosis not present

## 2015-10-19 DIAGNOSIS — E782 Mixed hyperlipidemia: Secondary | ICD-10-CM | POA: Diagnosis not present

## 2015-10-19 DIAGNOSIS — N183 Chronic kidney disease, stage 3 (moderate): Secondary | ICD-10-CM | POA: Diagnosis not present

## 2015-10-19 DIAGNOSIS — I10 Essential (primary) hypertension: Secondary | ICD-10-CM | POA: Diagnosis not present

## 2015-10-21 DIAGNOSIS — I10 Essential (primary) hypertension: Secondary | ICD-10-CM | POA: Diagnosis not present

## 2015-10-21 DIAGNOSIS — N183 Chronic kidney disease, stage 3 (moderate): Secondary | ICD-10-CM | POA: Diagnosis not present

## 2015-10-21 DIAGNOSIS — E1122 Type 2 diabetes mellitus with diabetic chronic kidney disease: Secondary | ICD-10-CM | POA: Diagnosis not present

## 2015-10-21 DIAGNOSIS — E875 Hyperkalemia: Secondary | ICD-10-CM | POA: Diagnosis not present

## 2015-11-11 DIAGNOSIS — I10 Essential (primary) hypertension: Secondary | ICD-10-CM | POA: Diagnosis not present

## 2016-01-20 DIAGNOSIS — E782 Mixed hyperlipidemia: Secondary | ICD-10-CM | POA: Diagnosis not present

## 2016-01-20 DIAGNOSIS — E1122 Type 2 diabetes mellitus with diabetic chronic kidney disease: Secondary | ICD-10-CM | POA: Diagnosis not present

## 2016-01-26 DIAGNOSIS — I10 Essential (primary) hypertension: Secondary | ICD-10-CM | POA: Diagnosis not present

## 2016-01-26 DIAGNOSIS — N183 Chronic kidney disease, stage 3 (moderate): Secondary | ICD-10-CM | POA: Diagnosis not present

## 2016-01-26 DIAGNOSIS — E782 Mixed hyperlipidemia: Secondary | ICD-10-CM | POA: Diagnosis not present

## 2016-01-26 DIAGNOSIS — E1122 Type 2 diabetes mellitus with diabetic chronic kidney disease: Secondary | ICD-10-CM | POA: Diagnosis not present

## 2016-01-26 DIAGNOSIS — E875 Hyperkalemia: Secondary | ICD-10-CM | POA: Diagnosis not present

## 2016-01-26 DIAGNOSIS — R634 Abnormal weight loss: Secondary | ICD-10-CM | POA: Diagnosis not present

## 2016-02-27 DIAGNOSIS — I10 Essential (primary) hypertension: Secondary | ICD-10-CM | POA: Diagnosis not present

## 2016-02-27 DIAGNOSIS — E1122 Type 2 diabetes mellitus with diabetic chronic kidney disease: Secondary | ICD-10-CM | POA: Diagnosis not present

## 2016-02-27 DIAGNOSIS — N183 Chronic kidney disease, stage 3 (moderate): Secondary | ICD-10-CM | POA: Diagnosis not present

## 2016-02-27 DIAGNOSIS — E782 Mixed hyperlipidemia: Secondary | ICD-10-CM | POA: Diagnosis not present

## 2016-03-07 DIAGNOSIS — Z23 Encounter for immunization: Secondary | ICD-10-CM | POA: Diagnosis not present

## 2016-04-16 DIAGNOSIS — N183 Chronic kidney disease, stage 3 (moderate): Secondary | ICD-10-CM | POA: Diagnosis not present

## 2016-04-16 DIAGNOSIS — R634 Abnormal weight loss: Secondary | ICD-10-CM | POA: Diagnosis not present

## 2016-04-16 DIAGNOSIS — E1122 Type 2 diabetes mellitus with diabetic chronic kidney disease: Secondary | ICD-10-CM | POA: Diagnosis not present

## 2016-04-16 DIAGNOSIS — Z85038 Personal history of other malignant neoplasm of large intestine: Secondary | ICD-10-CM | POA: Diagnosis not present

## 2016-04-26 DIAGNOSIS — R7301 Impaired fasting glucose: Secondary | ICD-10-CM | POA: Diagnosis not present

## 2016-04-26 DIAGNOSIS — E1122 Type 2 diabetes mellitus with diabetic chronic kidney disease: Secondary | ICD-10-CM | POA: Diagnosis not present

## 2016-04-26 DIAGNOSIS — I10 Essential (primary) hypertension: Secondary | ICD-10-CM | POA: Diagnosis not present

## 2016-04-26 DIAGNOSIS — N529 Male erectile dysfunction, unspecified: Secondary | ICD-10-CM | POA: Diagnosis not present

## 2016-04-26 DIAGNOSIS — E119 Type 2 diabetes mellitus without complications: Secondary | ICD-10-CM | POA: Diagnosis not present

## 2016-04-26 DIAGNOSIS — E039 Hypothyroidism, unspecified: Secondary | ICD-10-CM | POA: Diagnosis not present

## 2016-04-26 DIAGNOSIS — E782 Mixed hyperlipidemia: Secondary | ICD-10-CM | POA: Diagnosis not present

## 2016-04-26 DIAGNOSIS — E785 Hyperlipidemia, unspecified: Secondary | ICD-10-CM | POA: Diagnosis not present

## 2016-04-27 ENCOUNTER — Other Ambulatory Visit (HOSPITAL_COMMUNITY): Payer: Self-pay | Admitting: Internal Medicine

## 2016-04-27 DIAGNOSIS — Z85038 Personal history of other malignant neoplasm of large intestine: Secondary | ICD-10-CM

## 2016-04-27 DIAGNOSIS — R634 Abnormal weight loss: Secondary | ICD-10-CM

## 2016-04-30 DIAGNOSIS — Z6829 Body mass index (BMI) 29.0-29.9, adult: Secondary | ICD-10-CM | POA: Diagnosis not present

## 2016-04-30 DIAGNOSIS — E1122 Type 2 diabetes mellitus with diabetic chronic kidney disease: Secondary | ICD-10-CM | POA: Diagnosis not present

## 2016-04-30 DIAGNOSIS — I10 Essential (primary) hypertension: Secondary | ICD-10-CM | POA: Diagnosis not present

## 2016-04-30 DIAGNOSIS — E782 Mixed hyperlipidemia: Secondary | ICD-10-CM | POA: Diagnosis not present

## 2016-04-30 DIAGNOSIS — R634 Abnormal weight loss: Secondary | ICD-10-CM | POA: Diagnosis not present

## 2016-04-30 DIAGNOSIS — N183 Chronic kidney disease, stage 3 (moderate): Secondary | ICD-10-CM | POA: Diagnosis not present

## 2016-05-09 ENCOUNTER — Ambulatory Visit (HOSPITAL_COMMUNITY)
Admission: RE | Admit: 2016-05-09 | Discharge: 2016-05-09 | Disposition: A | Discharge status: Home / Self Care | Payer: Medicare Other | Source: Ambulatory Visit | Attending: Internal Medicine | Admitting: Internal Medicine

## 2016-05-09 ENCOUNTER — Other Ambulatory Visit (HOSPITAL_COMMUNITY): Payer: Self-pay | Admitting: Internal Medicine

## 2016-05-09 DIAGNOSIS — Z85038 Personal history of other malignant neoplasm of large intestine: Secondary | ICD-10-CM

## 2016-05-09 DIAGNOSIS — I700 Atherosclerosis of aorta: Secondary | ICD-10-CM | POA: Insufficient documentation

## 2016-05-09 DIAGNOSIS — R05 Cough: Secondary | ICD-10-CM | POA: Diagnosis not present

## 2016-05-09 DIAGNOSIS — N200 Calculus of kidney: Secondary | ICD-10-CM | POA: Insufficient documentation

## 2016-05-09 DIAGNOSIS — I708 Atherosclerosis of other arteries: Secondary | ICD-10-CM | POA: Diagnosis not present

## 2016-05-09 DIAGNOSIS — N2889 Other specified disorders of kidney and ureter: Secondary | ICD-10-CM | POA: Diagnosis not present

## 2016-05-09 DIAGNOSIS — R634 Abnormal weight loss: Secondary | ICD-10-CM

## 2016-05-09 DIAGNOSIS — N281 Cyst of kidney, acquired: Secondary | ICD-10-CM | POA: Insufficient documentation

## 2016-05-09 DIAGNOSIS — K8020 Calculus of gallbladder without cholecystitis without obstruction: Secondary | ICD-10-CM | POA: Diagnosis not present

## 2016-05-09 LAB — POCT I-STAT CREATININE: CREATININE: 1.3 mg/dL — AB (ref 0.61–1.24)

## 2016-05-11 ENCOUNTER — Other Ambulatory Visit (HOSPITAL_COMMUNITY): Payer: Self-pay | Admitting: Internal Medicine

## 2016-05-11 DIAGNOSIS — N289 Disorder of kidney and ureter, unspecified: Secondary | ICD-10-CM

## 2016-05-17 ENCOUNTER — Ambulatory Visit (HOSPITAL_COMMUNITY)
Admission: RE | Admit: 2016-05-17 | Discharge: 2016-05-17 | Disposition: A | Discharge status: Home / Self Care | Payer: Medicare Other | Source: Ambulatory Visit | Attending: Internal Medicine | Admitting: Internal Medicine

## 2016-05-17 DIAGNOSIS — D3501 Benign neoplasm of right adrenal gland: Secondary | ICD-10-CM | POA: Diagnosis not present

## 2016-05-17 DIAGNOSIS — Q6102 Congenital multiple renal cysts: Secondary | ICD-10-CM | POA: Insufficient documentation

## 2016-05-17 DIAGNOSIS — D1779 Benign lipomatous neoplasm of other sites: Secondary | ICD-10-CM | POA: Insufficient documentation

## 2016-05-17 DIAGNOSIS — N2889 Other specified disorders of kidney and ureter: Secondary | ICD-10-CM | POA: Diagnosis not present

## 2016-05-17 DIAGNOSIS — N289 Disorder of kidney and ureter, unspecified: Secondary | ICD-10-CM

## 2016-05-17 DIAGNOSIS — K8020 Calculus of gallbladder without cholecystitis without obstruction: Secondary | ICD-10-CM | POA: Insufficient documentation

## 2016-05-17 MED ORDER — GADOBENATE DIMEGLUMINE 529 MG/ML IV SOLN
20.0000 mL | Freq: Once | INTRAVENOUS | Status: AC | PRN
  Administered 2016-05-17: 20 mL via INTRAVENOUS

## 2016-05-29 ENCOUNTER — Ambulatory Visit (INDEPENDENT_AMBULATORY_CARE_PROVIDER_SITE_OTHER): Payer: Medicare Other | Admitting: Gastroenterology

## 2016-05-29 ENCOUNTER — Other Ambulatory Visit: Payer: Self-pay

## 2016-05-29 ENCOUNTER — Encounter: Payer: Self-pay | Admitting: Gastroenterology

## 2016-05-29 VITALS — BP 154/83 | HR 67 | Temp 98.3°F | Ht 74.0 in | Wt 216.4 lb

## 2016-05-29 DIAGNOSIS — Z85048 Personal history of other malignant neoplasm of rectum, rectosigmoid junction, and anus: Secondary | ICD-10-CM

## 2016-05-29 DIAGNOSIS — R630 Anorexia: Secondary | ICD-10-CM

## 2016-05-29 DIAGNOSIS — R634 Abnormal weight loss: Secondary | ICD-10-CM | POA: Diagnosis not present

## 2016-05-29 DIAGNOSIS — C189 Malignant neoplasm of colon, unspecified: Secondary | ICD-10-CM

## 2016-05-29 DIAGNOSIS — Z85038 Personal history of other malignant neoplasm of large intestine: Secondary | ICD-10-CM

## 2016-05-29 MED ORDER — PEG 3350-KCL-NA BICARB-NACL 420 G PO SOLR: 4000.0000 mL | ORAL | 0 refills | Status: DC

## 2016-05-29 NOTE — Patient Instructions (Signed)
We will review your labs from your PCP.  Colonoscopy with possible upper endoscopy to evaluate your weight loss and loss of appetite.

## 2016-05-29 NOTE — Progress Notes (Signed)
Primary Care Physician:  Wende Neighbors, MD  Primary Gastroenterologist:  Garfield Cornea, MD   Chief Complaint  Patient presents with  . Colonoscopy  . Weight Loss    HPI:  Mark Le is a 81 y.o. male here for further evaluation of unintentional weight loss, loss of appetite. He has a personal history of colon cancer in 2002 status post resection and chemotherapy at that time. At that time he presented with obstructing carcinoma in the distal left colon and sigmoid colon junction and required left hemicolectomy with temporary colostomy.  Patient has had multiple surveillance colonoscopies over the years, last one was in 2011. He has had multiple tubular adenomas removed. Since April of this year, patient states he has lost 50 pounds. He says he has a loss of appetite. He also confides in me that he is somewhat scared to eat. He states he was told he had hyperkalemia around April and his PCP "scared me". He states he became confused as to what he should be eat and not eat. With progressive weight loss he has had issues with hypoglycemia and has had his diabetes medications adjusted accordingly.  Patient denies nausea, abdominal pain. There is no vomiting. Denies upper GI symptoms such as heartburn or dysphagia. Denies constipation. States his bowel movements are less frequent because he doesn't eat as much. No blood in the stool or melena.  Patient had a CT abdomen and pelvis without contrast (dye allergy) with evidence of gallstones, enlarging right kidney lesion, aortoiliac arthrosclerosis. Follow-up MRI abdomen with and without contrast showed benign cyst in the kidneys, no evidence of renal neoplasm.   Current Outpatient Prescriptions  Medication Sig Dispense Refill  . amLODipine (NORVASC) 5 MG tablet Take 5 mg by mouth daily.    Marland Kitchen aspirin 81 MG tablet Take 160 mg by mouth daily.      . carvedilol (COREG) 12.5 MG tablet Take 12.5 mg by mouth 2 (two) times daily with a meal.      .  fenofibrate (TRICOR) 145 MG tablet Take 145 mg by mouth daily.    . insulin glargine (LANTUS) 100 UNIT/ML injection Inject 20 Units into the skin daily.    Marland Kitchen lisinopril (PRINIVIL,ZESTRIL) 40 MG tablet Take 40 mg by mouth daily.    . simvastatin (ZOCOR) 20 MG tablet Take 20 mg by mouth daily.     No current facility-administered medications for this visit.     Allergies as of 05/29/2016 - Review Complete 05/29/2016  Allergen Reaction Noted  . Iohexol  05/21/2007    Past Medical History:  Diagnosis Date  . Colon cancer ([place]) 2002   T3 N2 tumor with 2 of 4 positive lymph nodes, s/p resection and colostomy and post-op chemotherapy. reversal of colostomy 2003.   . Diabetes mellitus   . Gout 06/04/2011  . Hypertension   . Ventral hernia 07/24/2011    Past Surgical History:  Procedure Laterality Date  . COLONOSCOPY  11/2002   Dr. Alphonsa Overall: anastomosis at 45cm, otherwise unremarkable.  . COLONOSCOPY  05/2006   Dr. Alphonsa Overall: 4 polyps removed.  . COLONOSCOPY  07/2009   Dr. Alphonsa Overall: 2 tubular adenomas, next TCS five years.   . COLOSTOMY REVERSAL  01/2002   Dr. Alphonsa Overall  . PARTIAL COLECTOMY  04/2001   Dr. Alphonsa Overall: obstructing colon carcinoma of the left colon with resection and colostomy  . VENTRAL HERNIA REPAIR  04/2005   Dr. Alphonsa Overall    Family History  Problem  Relation Age of Onset  . Colon cancer Neg Hx     Social History   Social History  . Marital status: Divorced    Spouse name: N/A  . Number of children: N/A  . Years of education: N/A   Occupational History  . Not on file.   Social History Main Topics  . Smoking status: Former Research scientist (life sciences)  . Smokeless tobacco: Never Used     Comment: remote cigarettes for five years.   . Alcohol use No  . Drug use: No  . Sexual activity: Not on file   Other Topics Concern  . Not on file   Social History Narrative  . No narrative on file      ROS:  General: Negative for  fever, chills, fatigue,  weakness. See hpi Eyes: Negative for vision changes.  ENT: Negative for hoarseness, difficulty swallowing , nasal congestion. CV: Negative for chest pain, angina, palpitations, dyspnea on exertion, peripheral edema.  Respiratory: Negative for dyspnea at rest, dyspnea on exertion, cough, sputum, wheezing.  GI: See history of present illness. GU:  Negative for dysuria, hematuria, urinary incontinence, urinary frequency, nocturnal urination.  MS: Negative for joint pain, low back pain.  Derm: Negative for rash or itching.  Neuro: Negative for weakness, abnormal sensation, seizure, frequent headaches, memory loss, confusion.  Psych: Negative for anxiety, depression, suicidal ideation, hallucinations.  Endo: see hpi  Heme: Negative for bruising or bleeding. Allergy: Negative for rash or hives.    Physical Examination:  BP (!) 154/83   Pulse 67   Temp 98.3 F (36.8 C) (Oral)   Ht 6\' 2"  (1.88 m)   Wt 216 lb 6.4 oz (98.2 kg)   BMI 27.78 kg/m    General: Well-nourished, well-developed somewhat Disheveled male in no acute distress.  Head: Normocephalic, atraumatic.   Eyes: Conjunctiva pink, no icterus. Mouth: Oropharyngeal mucosa moist and pink , no lesions erythema or exudate. Neck: Supple without thyromegaly, masses, or lymphadenopathy.  Lungs: Clear to auscultation bilaterally.  Heart: Regular rate and rhythm, no murmurs rubs or gallops.  Abdomen: Bowel sounds are normal, nontender, nondistended, no hepatosplenomegaly or masses, no abdominal bruits or    hernia , no rebound or guarding.   Rectal: Not performed Extremities: No lower extremity edema. No clubbing or deformities.  Neuro: Alert and oriented x 4 , grossly normal neurologically.  Skin: Warm and dry, no rash or jaundice.   Psych: Alert and cooperative, normal mood and affect.  Labs: Requested  Imaging Studies: Ct Abdomen Pelvis Wo Contrast  Result Date: 05/09/2016 CLINICAL DATA:  40 pound weight loss in 6 months.  History of colon cancer in 2001. EXAM: CT ABDOMEN AND PELVIS WITHOUT CONTRAST TECHNIQUE: Multidetector CT imaging of the abdomen and pelvis was performed following the standard protocol without IV contrast. COMPARISON:  02/04/2008 FINDINGS: Lower chest: Linear areas of scarring in the lung bases bilaterally. Heart is normal size. No effusions. Hepatobiliary: Layering stones within the gallbladder. No focal hepatic abnormality or biliary ductal dilatation. Pancreas: No focal abnormality or ductal dilatation. Mild fatty replacement. Spleen: No focal abnormality.  Normal size. Adrenals/Urinary Tract: Small myelo lipoma in the right adrenal gland measuring 12 mm. Left adrenal gland is unremarkable. Low-density area in the mid to lower pole of the right kidney measures 16 mm, enlarged since 2009. This cannot be characterized without intravenous contrast. Punctate nonobstructing stone in the lower pole of the left kidney. No hydronephrosis. Bilateral perinephric stranding likely related to remote insults. Urinary bladder is unremarkable. Stomach/Bowel:  Moderate stool burden throughout the colon. Stomach, large and small bowel grossly unremarkable. Vascular/Lymphatic: Aortic and iliac calcifications. No aneurysm. No adenopathy. Reproductive: No visible focal abnormality. Other: Prior ventral hernia repair.  No free fluid or free air. Musculoskeletal: No acute bony abnormality or focal bone lesion. Degenerative disc and facet disease in the lumbar spine. IMPRESSION: Cholelithiasis. Enlarging low-density area within the midpole of the right kidney with a represent a small cyst cough but cannot be fully characterized without intravenous contrast. Consider further evaluation with contrast-enhanced CT or MRI. Punctate left lower pole nephrolithiasis. Aortoiliac atherosclerosis. Moderate stool burden in the colon. No acute findings in the abdomen or pelvis. Electronically Signed   By: Rolm Baptise M.D.   On: 05/09/2016 15:44    Mr Abdomen Wwo Contrast  Result Date: 05/17/2016 CLINICAL DATA:  Enlarging indeterminate right renal lesion seen on recent CT. EXAM: MRI ABDOMEN WITHOUT AND WITH CONTRAST TECHNIQUE: Multiplanar multisequence MR imaging of the abdomen was performed both before and after the administration of intravenous contrast. CONTRAST:  61mL MULTIHANCE GADOBENATE DIMEGLUMINE 529 MG/ML IV SOLN COMPARISON:  Noncontrast CT on 05/09/2016 FINDINGS: Lower chest: No acute findings. Hepatobiliary: No mass identified. Multiple tiny gallstones are seen, however there is no evidence of cholecystitis or biliary ductal dilatation. Pancreas:  No mass or inflammatory changes. Spleen:  Within normal limits in size and appearance. Adrenals/Urinary Tract: A 1.3 cm fat attenuation left adrenal mass remains stable, consistent with a benign myelolipoma. Left adrenal gland is normal in appearance. A 1.7 cm subcapsular cyst is seen in the lateral lower pole the right kidney, Bosniak category 1, which corresponds with the lesion seen on previous CT. No complex cystic or solid renal masses are identified. No evidence of hydronephrosis. Stomach/Bowel: Visualized portions within the abdomen are unremarkable. Vascular/Lymphatic: No pathologically enlarged lymph nodes identified. No abdominal aortic aneurysm demonstrated. Congenital left-sided SVC incidentally noted. Other:  None. Musculoskeletal:  No suspicious bone lesions identified. IMPRESSION: 1.6 cm benign Bosniak category 1 and subcapsular cyst in the lower pole the right kidney, corresponding with the lesion seen on previous CT. No evidence of renal neoplasm or hydronephrosis. Stable small benign right adrenal myelolipoma. Cholelithiasis. No radiographic evidence of cholecystitis or biliary dilatation. Electronically Signed   By: Earle Gell M.D.   On: 05/17/2016 16:17

## 2016-05-29 NOTE — Progress Notes (Signed)
CC'ED TO PCP 

## 2016-05-29 NOTE — Assessment & Plan Note (Signed)
81 year old gentleman who presents for further evaluation of abnormal weight loss of 50 pounds in the past 6 months, loss of appetite, remote history of colon cancer due for surveillance colonoscopy. Outside of loss of appetite, there are no other GI symptoms. CT and MRI abdomen reassuring. Patient has not had a chest x-ray. Recent labs have been requested for review.  Offer patient colonoscopy with possible upper endoscopy for further evaluation of loss of appetite, weight loss, surveillance colonoscopy for history of colon cancer.  I have discussed the risks, alternatives, benefits with regards to but not limited to the risk of reaction to medication, bleeding, infection, perforation and the patient is agreeable to proceed. Written consent to be obtained.  Requested copy of most recent labs for review.

## 2016-06-11 DIAGNOSIS — I10 Essential (primary) hypertension: Secondary | ICD-10-CM | POA: Diagnosis not present

## 2016-06-11 DIAGNOSIS — E1121 Type 2 diabetes mellitus with diabetic nephropathy: Secondary | ICD-10-CM | POA: Diagnosis not present

## 2016-06-11 DIAGNOSIS — N183 Chronic kidney disease, stage 3 (moderate): Secondary | ICD-10-CM | POA: Diagnosis not present

## 2016-06-11 DIAGNOSIS — N281 Cyst of kidney, acquired: Secondary | ICD-10-CM | POA: Diagnosis not present

## 2016-06-11 DIAGNOSIS — R634 Abnormal weight loss: Secondary | ICD-10-CM | POA: Diagnosis not present

## 2016-06-20 ENCOUNTER — Ambulatory Visit (HOSPITAL_COMMUNITY)
Admission: RE | Admit: 2016-06-20 | Discharge: 2016-06-20 | Disposition: A | Discharge status: Home / Self Care | Payer: Medicare Other | Source: Ambulatory Visit | Attending: Internal Medicine | Admitting: Internal Medicine

## 2016-06-20 ENCOUNTER — Encounter (HOSPITAL_COMMUNITY): Payer: Self-pay

## 2016-06-20 ENCOUNTER — Encounter (HOSPITAL_COMMUNITY)
Admission: RE | Disposition: A | Discharge status: Home / Self Care | Payer: Self-pay | Source: Ambulatory Visit | Attending: Internal Medicine

## 2016-06-20 DIAGNOSIS — I10 Essential (primary) hypertension: Secondary | ICD-10-CM | POA: Diagnosis not present

## 2016-06-20 DIAGNOSIS — D120 Benign neoplasm of cecum: Secondary | ICD-10-CM | POA: Diagnosis not present

## 2016-06-20 DIAGNOSIS — E279 Disorder of adrenal gland, unspecified: Secondary | ICD-10-CM | POA: Diagnosis not present

## 2016-06-20 DIAGNOSIS — D175 Benign lipomatous neoplasm of intra-abdominal organs: Secondary | ICD-10-CM | POA: Insufficient documentation

## 2016-06-20 DIAGNOSIS — K449 Diaphragmatic hernia without obstruction or gangrene: Secondary | ICD-10-CM | POA: Insufficient documentation

## 2016-06-20 DIAGNOSIS — E119 Type 2 diabetes mellitus without complications: Secondary | ICD-10-CM | POA: Diagnosis not present

## 2016-06-20 DIAGNOSIS — R634 Abnormal weight loss: Secondary | ICD-10-CM | POA: Diagnosis not present

## 2016-06-20 DIAGNOSIS — E875 Hyperkalemia: Secondary | ICD-10-CM | POA: Insufficient documentation

## 2016-06-20 DIAGNOSIS — Z9049 Acquired absence of other specified parts of digestive tract: Secondary | ICD-10-CM | POA: Diagnosis not present

## 2016-06-20 DIAGNOSIS — N200 Calculus of kidney: Secondary | ICD-10-CM | POA: Insufficient documentation

## 2016-06-20 DIAGNOSIS — Z85038 Personal history of other malignant neoplasm of large intestine: Secondary | ICD-10-CM | POA: Insufficient documentation

## 2016-06-20 DIAGNOSIS — M5136 Other intervertebral disc degeneration, lumbar region: Secondary | ICD-10-CM | POA: Insufficient documentation

## 2016-06-20 DIAGNOSIS — Z79899 Other long term (current) drug therapy: Secondary | ICD-10-CM | POA: Diagnosis not present

## 2016-06-20 DIAGNOSIS — M109 Gout, unspecified: Secondary | ICD-10-CM | POA: Insufficient documentation

## 2016-06-20 DIAGNOSIS — R630 Anorexia: Secondary | ICD-10-CM

## 2016-06-20 DIAGNOSIS — Z87891 Personal history of nicotine dependence: Secondary | ICD-10-CM | POA: Diagnosis not present

## 2016-06-20 DIAGNOSIS — Z7982 Long term (current) use of aspirin: Secondary | ICD-10-CM | POA: Insufficient documentation

## 2016-06-20 DIAGNOSIS — Z794 Long term (current) use of insulin: Secondary | ICD-10-CM | POA: Diagnosis not present

## 2016-06-20 DIAGNOSIS — I708 Atherosclerosis of other arteries: Secondary | ICD-10-CM | POA: Insufficient documentation

## 2016-06-20 DIAGNOSIS — K8020 Calculus of gallbladder without cholecystitis without obstruction: Secondary | ICD-10-CM | POA: Diagnosis not present

## 2016-06-20 DIAGNOSIS — D122 Benign neoplasm of ascending colon: Secondary | ICD-10-CM | POA: Diagnosis not present

## 2016-06-20 DIAGNOSIS — Z85048 Personal history of other malignant neoplasm of rectum, rectosigmoid junction, and anus: Secondary | ICD-10-CM

## 2016-06-20 HISTORY — PX: COLONOSCOPY: SHX5424

## 2016-06-20 HISTORY — PX: ESOPHAGOGASTRODUODENOSCOPY: SHX5428

## 2016-06-20 LAB — GLUCOSE, CAPILLARY
GLUCOSE-CAPILLARY: 98 mg/dL (ref 65–99)
Glucose-Capillary: 67 mg/dL (ref 65–99)

## 2016-06-20 SURGERY — COLONOSCOPY
Anesthesia: Moderate Sedation

## 2016-06-20 MED ORDER — SODIUM CHLORIDE 0.9% FLUSH
INTRAVENOUS | Status: AC
  Filled 2016-06-20: qty 10

## 2016-06-20 MED ORDER — DEXTROSE 50 % IV SOLN
INTRAVENOUS | Status: AC
  Filled 2016-06-20: qty 50

## 2016-06-20 MED ORDER — ONDANSETRON HCL 4 MG/2ML IJ SOLN
INTRAMUSCULAR | Status: AC
  Filled 2016-06-20: qty 2

## 2016-06-20 MED ORDER — DEXTROSE 50 % IV SOLN
INTRAVENOUS | Status: DC | PRN
  Administered 2016-06-20: 25 mL via INTRAVENOUS

## 2016-06-20 MED ORDER — MEPERIDINE HCL 100 MG/ML IJ SOLN
INTRAMUSCULAR | Status: AC
  Filled 2016-06-20: qty 2

## 2016-06-20 MED ORDER — LIDOCAINE VISCOUS 2 % MT SOLN
OROMUCOSAL | Status: AC
  Filled 2016-06-20: qty 15

## 2016-06-20 MED ORDER — MIDAZOLAM HCL 5 MG/5ML IJ SOLN
INTRAMUSCULAR | Status: AC
  Filled 2016-06-20: qty 10

## 2016-06-20 MED ORDER — SODIUM CHLORIDE 0.9 % IV SOLN
INTRAVENOUS | Status: DC
  Administered 2016-06-20: 10:00:00 via INTRAVENOUS

## 2016-06-20 MED ORDER — MIDAZOLAM HCL 5 MG/5ML IJ SOLN
INTRAMUSCULAR | Status: DC | PRN
  Administered 2016-06-20: 1 mg via INTRAVENOUS
  Administered 2016-06-20: 2 mg via INTRAVENOUS
  Administered 2016-06-20: 1 mg via INTRAVENOUS
  Administered 2016-06-20: 2 mg via INTRAVENOUS

## 2016-06-20 MED ORDER — STERILE WATER FOR IRRIGATION IR SOLN
Status: DC | PRN
  Administered 2016-06-20: 2.5 mL

## 2016-06-20 MED ORDER — ONDANSETRON HCL 4 MG/2ML IJ SOLN
INTRAMUSCULAR | Status: DC | PRN
  Administered 2016-06-20: 4 mg via INTRAVENOUS

## 2016-06-20 MED ORDER — SODIUM CHLORIDE 0.9 % IJ SOLN
INTRAMUSCULAR | Status: DC | PRN
  Administered 2016-06-20: 3 mL via INTRAVENOUS

## 2016-06-20 MED ORDER — MEPERIDINE HCL 100 MG/ML IJ SOLN
INTRAMUSCULAR | Status: DC | PRN
  Administered 2016-06-20: 50 mg via INTRAVENOUS
  Administered 2016-06-20 (×2): 25 mg via INTRAVENOUS

## 2016-06-20 MED ORDER — LIDOCAINE VISCOUS 2 % MT SOLN
OROMUCOSAL | Status: DC | PRN
  Administered 2016-06-20: 1 via OROMUCOSAL

## 2016-06-20 NOTE — Discharge Instructions (Addendum)
°Colonoscopy °Discharge Instructions ° °Read the instructions outlined below and refer to this sheet in the next few weeks. These discharge instructions provide you with general information on caring for yourself after you leave the hospital. Your doctor may also give you specific instructions. While your treatment has been planned according to the most current medical practices available, unavoidable complications occasionally occur. If you have any problems or questions after discharge, call Dr. Rourk at [phone]. °ACTIVITY °· You may resume your regular activity, but move at a slower pace for the next 24 hours.  °· Take frequent rest periods for the next 24 hours.  °· Walking will help get rid of the air and reduce the bloated feeling in your belly (abdomen).  °· No driving for 24 hours (because of the medicine (anesthesia) used during the test).   °· Do not sign any important legal documents or operate any machinery for 24 hours (because of the anesthesia used during the test).  °NUTRITION °· Drink plenty of fluids.  °· You may resume your normal diet as instructed by your doctor.  °· Begin with a light meal and progress to your normal diet. Heavy or fried foods are harder to digest and may make you feel sick to your stomach (nauseated).  °· Avoid alcoholic beverages for 24 hours or as instructed.  °MEDICATIONS °· You may resume your normal medications unless your doctor tells you otherwise.  °WHAT YOU CAN EXPECT TODAY °· Some feelings of bloating in the abdomen.  °· Passage of more gas than usual.  °· Spotting of blood in your stool or on the toilet paper.  °IF YOU HAD POLYPS REMOVED DURING THE COLONOSCOPY: °· No aspirin products for 7 days or as instructed.  °· No alcohol for 7 days or as instructed.  °· Eat a soft diet for the next 24 hours.  °FINDING OUT THE RESULTS OF YOUR TEST °Not all test results are available during your visit. If your test results are not back during the visit, make an appointment  with your caregiver to find out the results. Do not assume everything is normal if you have not heard from your caregiver or the medical facility. It is important for you to follow up on all of your test results.  °SEEK IMMEDIATE MEDICAL ATTENTION IF: °· You have more than a spotting of blood in your stool.  °· Your belly is swollen (abdominal distention).  °· You are nauseated or vomiting.  °· You have a temperature over 101.  °· You have abdominal pain or discomfort that is severe or gets worse throughout the day.  °EGD °Discharge instructions °Please read the instructions outlined below and refer to this sheet in the next few weeks. These discharge instructions provide you with general information on caring for yourself after you leave the hospital. Your doctor may also give you specific instructions. While your treatment has been planned according to the most current medical practices available, unavoidable complications occasionally occur. If you have any problems or questions after discharge, please call your doctor. °ACTIVITY °· You may resume your regular activity but move at a slower pace for the next 24 hours.  °· Take frequent rest periods for the next 24 hours.  °· Walking will help expel (get rid of) the air and reduce the bloated feeling in your abdomen.  °· No driving for 24 hours (because of the anesthesia (medicine) used during the test).  °· You may shower.  °· Do not sign any important   legal documents or operate any machinery for 24 hours (because of the anesthesia used during the test).  NUTRITION  Drink plenty of fluids.   You may resume your normal diet.   Begin with a light meal and progress to your normal diet.   Avoid alcoholic beverages for 24 hours or as instructed by your caregiver.  MEDICATIONS  You may resume your normal medications unless your caregiver tells you otherwise.  WHAT YOU CAN EXPECT TODAY  You may experience abdominal discomfort such as a feeling of fullness  or gas pains.  FOLLOW-UP  Your doctor will discuss the results of your test with you.  SEEK IMMEDIATE MEDICAL ATTENTION IF ANY OF THE FOLLOWING OCCUR:  Excessive nausea (feeling sick to your stomach) and/or vomiting.   Severe abdominal pain and distention (swelling).   Trouble swallowing.   Temperature over 101 F (37.8 C).   Rectal bleeding or vomiting of blood.     Colonic polyp information provided  Further recommendations to follow pending review of pathology report  Office visit with [place] in 6 weeks     *******January 17,2018 at 1:30pm.   With Neil Crouch, PA       Colon Polyps Introduction Polyps are tissue growths inside the body. Polyps can grow in many places, including the large intestine (colon). A polyp may be a round bump or a mushroom-shaped growth. You could have one polyp or several. Most colon polyps are noncancerous (benign). However, some colon polyps can become cancerous over time. What are the causes? The exact cause of colon polyps is not known. What increases the risk? This condition is more likely to develop in people who:  Have a family history of colon cancer or colon polyps.  Are older than 40 or older than 45 if they are African American.  Have inflammatory bowel disease, such as ulcerative colitis or Crohn disease.  Are overweight.  Smoke cigarettes.  Do not get enough exercise.  Drink too much alcohol.  Eat a diet that is:  High in fat and red meat.  Low in fiber.  Had childhood cancer that was treated with abdominal radiation. What are the signs or symptoms? Most polyps do not cause symptoms. If you have symptoms, they may include:  Blood coming from your rectum when having a bowel movement.  Blood in your stool.The stool may look dark red or black.  A change in bowel habits, such as constipation or diarrhea. How is this diagnosed? This condition is diagnosed with a colonoscopy. This is a procedure that uses a  lighted, flexible scope to look at the inside of your colon. How is this treated? Treatment for this condition involves removing any polyps that are found. Those polyps will then be tested for cancer. If cancer is found, your health care provider will talk to you about options for colon cancer treatment. Follow these instructions at home: Diet  Eat plenty of fiber, such as fruits, vegetables, and whole grains.  Eat foods that are high in calcium and vitamin D, such as milk, cheese, yogurt, eggs, liver, fish, and broccoli.  Limit foods high in fat, red meats, and processed meats, such as hot dogs, sausage, bacon, and lunch meats.  Maintain a healthy weight, or lose weight if recommended by your health care provider. General instructions  Do not smoke cigarettes.  Do not drink alcohol excessively.  Keep all follow-up visits as told by your health care provider. This is important. This includes keeping regularly scheduled colonoscopies. Talk  to your health care provider about when you need a colonoscopy.  Exercise every day or as told by your health care provider. Contact a health care provider if:  You have new or worsening bleeding during a bowel movement.  You have new or increased blood in your stool.  You have a change in bowel habits.  You unexpectedly lose weight. This information is not intended to replace advice given to you by your health care provider. Make sure you discuss any questions you have with your health care provider. Document Released: 03/28/2004 Document Revised: 12/08/2015 Document Reviewed: 05/23/2015  2017 Elsevier

## 2016-06-20 NOTE — H&P (View-Only) (Signed)
Primary Care Physician:  Wende Neighbors, MD  Primary Gastroenterologist:  Garfield Cornea, MD   Chief Complaint  Patient presents with  . Colonoscopy  . Weight Loss    HPI:  Mark Le is a 81 y.o. male here for further evaluation of unintentional weight loss, loss of appetite. He has a personal history of colon cancer in 2002 status post resection and chemotherapy at that time. At that time he presented with obstructing carcinoma in the distal left colon and sigmoid colon junction and required left hemicolectomy with temporary colostomy.  Patient has had multiple surveillance colonoscopies over the years, last one was in 2011. He has had multiple tubular adenomas removed. Since April of this year, patient states he has lost 50 pounds. He says he has a loss of appetite. He also confides in me that he is somewhat scared to eat. He states he was told he had hyperkalemia around April and his PCP "scared me". He states he became confused as to what he should be eat and not eat. With progressive weight loss he has had issues with hypoglycemia and has had his diabetes medications adjusted accordingly.  Patient denies nausea, abdominal pain. There is no vomiting. Denies upper GI symptoms such as heartburn or dysphagia. Denies constipation. States his bowel movements are less frequent because he doesn't eat as much. No blood in the stool or melena.  Patient had a CT abdomen and pelvis without contrast (dye allergy) with evidence of gallstones, enlarging right kidney lesion, aortoiliac arthrosclerosis. Follow-up MRI abdomen with and without contrast showed benign cyst in the kidneys, no evidence of renal neoplasm.   Current Outpatient Prescriptions  Medication Sig Dispense Refill  . amLODipine (NORVASC) 5 MG tablet Take 5 mg by mouth daily.    Marland Kitchen aspirin 81 MG tablet Take 160 mg by mouth daily.      . carvedilol (COREG) 12.5 MG tablet Take 12.5 mg by mouth 2 (two) times daily with a meal.      .  fenofibrate (TRICOR) 145 MG tablet Take 145 mg by mouth daily.    . insulin glargine (LANTUS) 100 UNIT/ML injection Inject 20 Units into the skin daily.    Marland Kitchen lisinopril (PRINIVIL,ZESTRIL) 40 MG tablet Take 40 mg by mouth daily.    . simvastatin (ZOCOR) 20 MG tablet Take 20 mg by mouth daily.     No current facility-administered medications for this visit.     Allergies as of 05/29/2016 - Review Complete 05/29/2016  Allergen Reaction Noted  . Iohexol  05/21/2007    Past Medical History:  Diagnosis Date  . Colon cancer ([place]) 2002   T3 N2 tumor with 2 of 4 positive lymph nodes, s/p resection and colostomy and post-op chemotherapy. reversal of colostomy 2003.   . Diabetes mellitus   . Gout 06/04/2011  . Hypertension   . Ventral hernia 07/24/2011    Past Surgical History:  Procedure Laterality Date  . COLONOSCOPY  11/2002   Dr. Alphonsa Overall: anastomosis at 45cm, otherwise unremarkable.  . COLONOSCOPY  05/2006   Dr. Alphonsa Overall: 4 polyps removed.  . COLONOSCOPY  07/2009   Dr. Alphonsa Overall: 2 tubular adenomas, next TCS five years.   . COLOSTOMY REVERSAL  01/2002   Dr. Alphonsa Overall  . PARTIAL COLECTOMY  04/2001   Dr. Alphonsa Overall: obstructing colon carcinoma of the left colon with resection and colostomy  . VENTRAL HERNIA REPAIR  04/2005   Dr. Alphonsa Overall    Family History  Problem  Relation Age of Onset  . Colon cancer Neg Hx     Social History   Social History  . Marital status: Divorced    Spouse name: N/A  . Number of children: N/A  . Years of education: N/A   Occupational History  . Not on file.   Social History Main Topics  . Smoking status: Former Research scientist (life sciences)  . Smokeless tobacco: Never Used     Comment: remote cigarettes for five years.   . Alcohol use No  . Drug use: No  . Sexual activity: Not on file   Other Topics Concern  . Not on file   Social History Narrative  . No narrative on file      ROS:  General: Negative for  fever, chills, fatigue,  weakness. See hpi Eyes: Negative for vision changes.  ENT: Negative for hoarseness, difficulty swallowing , nasal congestion. CV: Negative for chest pain, angina, palpitations, dyspnea on exertion, peripheral edema.  Respiratory: Negative for dyspnea at rest, dyspnea on exertion, cough, sputum, wheezing.  GI: See history of present illness. GU:  Negative for dysuria, hematuria, urinary incontinence, urinary frequency, nocturnal urination.  MS: Negative for joint pain, low back pain.  Derm: Negative for rash or itching.  Neuro: Negative for weakness, abnormal sensation, seizure, frequent headaches, memory loss, confusion.  Psych: Negative for anxiety, depression, suicidal ideation, hallucinations.  Endo: see hpi  Heme: Negative for bruising or bleeding. Allergy: Negative for rash or hives.    Physical Examination:  BP (!) 154/83   Pulse 67   Temp 98.3 F (36.8 C) (Oral)   Ht 6\' 2"  (1.88 m)   Wt 216 lb 6.4 oz (98.2 kg)   BMI 27.78 kg/m    General: Well-nourished, well-developed somewhat Disheveled male in no acute distress.  Head: Normocephalic, atraumatic.   Eyes: Conjunctiva pink, no icterus. Mouth: Oropharyngeal mucosa moist and pink , no lesions erythema or exudate. Neck: Supple without thyromegaly, masses, or lymphadenopathy.  Lungs: Clear to auscultation bilaterally.  Heart: Regular rate and rhythm, no murmurs rubs or gallops.  Abdomen: Bowel sounds are normal, nontender, nondistended, no hepatosplenomegaly or masses, no abdominal bruits or    hernia , no rebound or guarding.   Rectal: Not performed Extremities: No lower extremity edema. No clubbing or deformities.  Neuro: Alert and oriented x 4 , grossly normal neurologically.  Skin: Warm and dry, no rash or jaundice.   Psych: Alert and cooperative, normal mood and affect.  Labs: Requested  Imaging Studies: Ct Abdomen Pelvis Wo Contrast  Result Date: 05/09/2016 CLINICAL DATA:  40 pound weight loss in 6 months.  History of colon cancer in 2001. EXAM: CT ABDOMEN AND PELVIS WITHOUT CONTRAST TECHNIQUE: Multidetector CT imaging of the abdomen and pelvis was performed following the standard protocol without IV contrast. COMPARISON:  02/04/2008 FINDINGS: Lower chest: Linear areas of scarring in the lung bases bilaterally. Heart is normal size. No effusions. Hepatobiliary: Layering stones within the gallbladder. No focal hepatic abnormality or biliary ductal dilatation. Pancreas: No focal abnormality or ductal dilatation. Mild fatty replacement. Spleen: No focal abnormality.  Normal size. Adrenals/Urinary Tract: Small myelo lipoma in the right adrenal gland measuring 12 mm. Left adrenal gland is unremarkable. Low-density area in the mid to lower pole of the right kidney measures 16 mm, enlarged since 2009. This cannot be characterized without intravenous contrast. Punctate nonobstructing stone in the lower pole of the left kidney. No hydronephrosis. Bilateral perinephric stranding likely related to remote insults. Urinary bladder is unremarkable. Stomach/Bowel:  Moderate stool burden throughout the colon. Stomach, large and small bowel grossly unremarkable. Vascular/Lymphatic: Aortic and iliac calcifications. No aneurysm. No adenopathy. Reproductive: No visible focal abnormality. Other: Prior ventral hernia repair.  No free fluid or free air. Musculoskeletal: No acute bony abnormality or focal bone lesion. Degenerative disc and facet disease in the lumbar spine. IMPRESSION: Cholelithiasis. Enlarging low-density area within the midpole of the right kidney with a represent a small cyst cough but cannot be fully characterized without intravenous contrast. Consider further evaluation with contrast-enhanced CT or MRI. Punctate left lower pole nephrolithiasis. Aortoiliac atherosclerosis. Moderate stool burden in the colon. No acute findings in the abdomen or pelvis. Electronically Signed   By: Rolm Baptise M.D.   On: 05/09/2016 15:44    Mr Abdomen Wwo Contrast  Result Date: 05/17/2016 CLINICAL DATA:  Enlarging indeterminate right renal lesion seen on recent CT. EXAM: MRI ABDOMEN WITHOUT AND WITH CONTRAST TECHNIQUE: Multiplanar multisequence MR imaging of the abdomen was performed both before and after the administration of intravenous contrast. CONTRAST:  29mL MULTIHANCE GADOBENATE DIMEGLUMINE 529 MG/ML IV SOLN COMPARISON:  Noncontrast CT on 05/09/2016 FINDINGS: Lower chest: No acute findings. Hepatobiliary: No mass identified. Multiple tiny gallstones are seen, however there is no evidence of cholecystitis or biliary ductal dilatation. Pancreas:  No mass or inflammatory changes. Spleen:  Within normal limits in size and appearance. Adrenals/Urinary Tract: A 1.3 cm fat attenuation left adrenal mass remains stable, consistent with a benign myelolipoma. Left adrenal gland is normal in appearance. A 1.7 cm subcapsular cyst is seen in the lateral lower pole the right kidney, Bosniak category 1, which corresponds with the lesion seen on previous CT. No complex cystic or solid renal masses are identified. No evidence of hydronephrosis. Stomach/Bowel: Visualized portions within the abdomen are unremarkable. Vascular/Lymphatic: No pathologically enlarged lymph nodes identified. No abdominal aortic aneurysm demonstrated. Congenital left-sided SVC incidentally noted. Other:  None. Musculoskeletal:  No suspicious bone lesions identified. IMPRESSION: 1.6 cm benign Bosniak category 1 and subcapsular cyst in the lower pole the right kidney, corresponding with the lesion seen on previous CT. No evidence of renal neoplasm or hydronephrosis. Stable small benign right adrenal myelolipoma. Cholelithiasis. No radiographic evidence of cholecystitis or biliary dilatation. Electronically Signed   By: Earle Gell M.D.   On: 05/17/2016 16:17

## 2016-06-20 NOTE — Op Note (Signed)
[facility] Patient Name: Mark Le Procedure Date: 06/20/2016 1:30 PM MRN: [id number] Date of Birth: [birth year] Attending MD: Norvel Richards , MD CSN: [id number] Age: 81 Admit Type: Outpatient Procedure:                Upper GI endoscopy - Diagnostic Indications:              Weight loss Providers:                Norvel Richards, MD, Lurline Del, RN, [facility], Technician Referring MD:              Medicines:                Midazolam 6 mg IV, Meperidine 100 mg IV,                            Ondansetron 4 mg IV Complications:            No immediate complications. Estimated Blood Loss:     Estimated blood loss: none. Procedure:                Pre-Anesthesia Assessment:                           - Prior to the procedure, a History and Physical                            was performed, and patient medications and                            allergies were reviewed. The patient's tolerance of                            previous anesthesia was also reviewed. The risks                            and benefits of the procedure and the sedation                            options and risks were discussed with the patient.                            All questions were answered, and informed consent                            was obtained. Prior Anticoagulants: The patient has                            taken no previous anticoagulant or antiplatelet                            agents. ASA Grade Assessment: II - A patient with  mild systemic disease. After reviewing the risks                            and benefits, the patient was deemed in                            satisfactory condition to undergo the procedure.                           - Prior to the procedure, a History and Physical                            was performed, and patient medications and                            allergies were reviewed. The patient's  tolerance of                            previous anesthesia was also reviewed. The risks                            and benefits of the procedure and the sedation                            options and risks were discussed with the patient.                            All questions were answered, and informed consent                            was obtained. Prior Anticoagulants: The patient has                            taken no previous anticoagulant or antiplatelet                            agents. ASA Grade Assessment: III - A patient with                            severe systemic disease. After reviewing the risks                            and benefits, the patient was deemed in                            satisfactory condition to undergo the procedure.                           After obtaining informed consent, the endoscope was                            passed under direct vision. Throughout the  procedure, the patient's blood pressure, pulse, and                            oxygen saturations were monitored continuously. The                            [id number] scope was introduced through the                            mouth, and advanced to the second part of duodenum.                            The upper GI endoscopy was accomplished without                            difficulty. The patient tolerated the procedure                            well. Scope In: 1:35:23 PM Scope Out: 1:38:26 PM Total Procedure Duration: 0 hours 3 minutes 3 seconds  Findings:      The examined esophagus was normal.      A large hiatal hernia was present.      The stomach was otherwise without abnormality.      The duodenal bulb and second portion of the duodenum were normal. Impression:               - Normal esophagus.                           - Large hiatal hernia.                           - The examination was otherwise normal.                           -  Normal duodenal bulb and second portion of the                            duodenum.                           - No specimens collected. Moderate Sedation:      Moderate (conscious) sedation was administered by the endoscopy nurse       and supervised by the endoscopist. The following parameters were       monitored: oxygen saturation, heart rate, blood pressure, respiratory       rate, EKG, adequacy of pulmonary ventilation, and response to care.       Total physician intraservice time was 55 minutes. Recommendation:           - Patient has a contact number available for                            emergencies. The signs and symptoms of potential  delayed complications were discussed with the                            patient. Return to normal activities tomorrow.                            Written discharge instructions were provided to the                            patient.                           - Resume previous diet.                           - Continue present medications.                           - No repeat upper endoscopy.                           - Return to GI office in 6 weeks. See colonoscopy                            report. Procedure Code(s):        --- Professional ---                           [phone], Esophagogastroduodenoscopy, flexible,                            transoral; diagnostic, including collection of                            specimen(s) by brushing or washing, when performed                            (separate procedure)                           99152, Moderate sedation services provided by the                            same physician or other qualified health care                            professional performing the diagnostic or                            therapeutic service that the sedation supports,                            requiring the presence of an independent trained                            observer to  assist in the monitoring of the  patient's level of consciousness and physiological                            status; initial 15 minutes of intraservice time,                            patient age 73 years or older                           [id number], Moderate sedation services; each additional                            15 minutes intraservice time                           99153, Moderate sedation services; each additional                            15 minutes intraservice time                           99153, Moderate sedation services; each additional                            15 minutes intraservice time Diagnosis Code(s):        --- Professional ---                           K44.9, Diaphragmatic hernia without obstruction or                            gangrene                           R63.4, Abnormal weight loss CPT copyright 2016 American Medical Association. All rights reserved. The codes documented in this report are preliminary and upon coder review may  be revised to meet current compliance requirements. Mark Le. Mark Scharfenberg, MD Norvel Richards, MD 06/20/2016 2:00:06 PM This report has been signed electronically. Number of Addenda: 0

## 2016-06-20 NOTE — Interval H&P Note (Signed)
History and Physical Interval Note:  06/20/2016 12:39 PM  Mark Le  has presented today for surgery, with the diagnosis of weight loss, history of CRC, loss of appetite  The various methods of treatment have been discussed with the patient and family. After consideration of risks, benefits and other options for treatment, the patient has consented to  Procedure(s) with comments: COLONOSCOPY (N/A) - 12:15 pm ESOPHAGOGASTRODUODENOSCOPY (EGD) (N/A) as a surgical intervention .  The patient's history has been reviewed, patient examined, no change in status, stable for surgery.  I have reviewed the patient's chart and labs.  Questions were answered to the patient's satisfaction.     Robert Rourk  No change. Colonoscopy with possible EGD to follow. Patient denies dysphagia.  The risks, benefits, limitations, imponderables and alternatives regarding both EGD and colonoscopy have been reviewed with the patient. Questions have been answered. All parties agreeable.

## 2016-06-20 NOTE — Op Note (Signed)
[facility] Patient Name: Mark Le Procedure Date: 06/20/2016 12:38 PM MRN: [id number] Date of Birth: 23-Aug-1934 Attending MD: Norvel Richards , MD CSN: [id number] Age: 81 Admit Type: Outpatient Procedure:                Ileo-colonoscopy with multiple snare polypectomies Indications:              High risk colon cancer surveillance: Personal                            history of colon cancer Providers:                Norvel Richards, MD, Lurline Del, RN, Purcell Nails.                            [place], [employer] Referring MD:              Medicines:                Midazolam 6 mg IV, Meperidine 100 mg IV,                            Ondansetron 4 mg IV Complications:            .No immediate complications. Estimated Blood Loss:     Estimated blood loss was minimal. Procedure:                Pre-Anesthesia Assessment:                           - Prior to the procedure, a History and Physical                            was performed, and patient medications and                            allergies were reviewed. The patient's tolerance of                            previous anesthesia was also reviewed. The risks                            and benefits of the procedure and the sedation                            options and risks were discussed with the patient.                            All questions were answered, and informed consent                            was obtained. Prior Anticoagulants: The patient has                            taken no previous anticoagulant or antiplatelet  agents. ASA Grade Assessment: II - A patient with                            mild systemic disease. After reviewing the risks                            and benefits, the patient was deemed in                            satisfactory condition to undergo the procedure.                           After obtaining informed consent, the colonoscope      was passed under direct vision. Throughout the                            procedure, the patient's blood pressure, pulse, and                            oxygen saturations were monitored continuously. The                            EC-[id number] scope was introduced through                            the anus and advanced to the 5 cm into the ileum.                            The terminal ileum, ileocecal valve, appendiceal                            orifice, and rectum were photographed. The terminal                            ileum, ileocecal valve, appendiceal orifice, and                            rectum were photographed. The entire colon was well                            visualized. The colonoscopy was performed without                            difficulty. The patient tolerated the procedure                            well. The quality of the bowel preparation was                            adequate. The quality of the bowel preparation was                            adequate. Scope In: 12:54:08 PM Scope Out: 1:25:40 PM Scope  Withdrawal Time: 0 hours 25 minutes 50 seconds  Total Procedure Duration: 0 hours 31 minutes 32 seconds  Findings:      The perianal and digital rectal examinations were normal.      A 12 mm polyp was found in the cecum. The polyp was semi-pedunculated.       It was growing out of the appendiceal orifice. The base was lifted away       from the cecal mucosa with 3 mL of normal saline injected submucosally.       The polyp was removed with a hot snare. Piecemeal resection-3 passes       Resection and retrieval were complete. Estimated blood loss: none.       Patient also had a yellow soft submucosal nodule in the cecum (positive       pillow sign consistent with a lipoma).The patient had 4 polyps in the       ascending segment. (2) were sessile, approximately 1.25 cm in       dimensions. They were felt to have been removed completely with a        piecemeal hot snare polypectomy. There were (2) 5 mm sessile polyps in       the same segment. They were cold snared ard resected. The remainder of       the colonic mucosa appeared normal. Site of prior surgery was       inaapparent. The distal 5 cm of terminal ileum mucosa also appeared       normal. Impression:               - Multiple colonic polyps removed as described                            abov. Resected and retrieved. Colonic lipoma. Moderate Sedation:      Moderate (conscious) sedation was administered by the endoscopy nurse       and supervised by the endoscopist. The following parameters were       monitored: oxygen saturation, heart rate, blood pressure, respiratory       rate, EKG, adequacy of pulmonary ventilation, and response to care.       Total physician intraservice time was 42 minutes. Recommendation:           - Patient has a contact number available for                            emergencies. The signs and symptoms of potential                            delayed complications were discussed with the                            patient. Return to normal activities tomorrow.                            Written discharge instructions were provided to the                            patient.                           -  Resume previous diet.                           - Continue present medications.                           - Repeat colonoscopy date to be determined after                            pending pathology results are reviewed for                            surveillance based on pathology results.                           - Return to GI office in 6 weeks. Procedure Code(s):        --- Professional ---                           [phone], Colonoscopy, flexible; with removal of                            tumor(s), polyp(s), or other lesion(s) by snare                            technique                           99152, Moderate sedation services provided by the                             same physician or other qualified health care                            professional performing the diagnostic or                            therapeutic service that the sedation supports,                            requiring the presence of an independent trained                            observer to assist in the monitoring of the                            patient's level of consciousness and physiological                            status; initial 15 minutes of intraservice time,                            patient age 34 years or older  [id number], Moderate sedation services; each additional                            15 minutes intraservice time                           99153, Moderate sedation services; each additional                            15 minutes intraservice time Diagnosis Code(s):        --- Professional ---                           [id number], Personal history of other malignant                            neoplasm of large intestine                           D12.0, Benign neoplasm of cecum CPT copyright 2016 [employer]. All rights reserved. The codes documented in this report are preliminary and upon coder review may  be revised to meet current compliance requirements. Cristopher Estimable. Rourk, MD Norvel Richards, MD 06/20/2016 1:55:15 PM This report has been signed electronically. Number of Addenda: 0

## 2016-06-26 ENCOUNTER — Encounter: Payer: Self-pay | Admitting: Internal Medicine

## 2016-06-27 ENCOUNTER — Telehealth: Payer: Self-pay

## 2016-06-27 ENCOUNTER — Encounter (HOSPITAL_COMMUNITY): Payer: Self-pay | Admitting: Internal Medicine

## 2016-06-27 NOTE — Telephone Encounter (Signed)
Letter mailed to the pt. 

## 2016-06-27 NOTE — Telephone Encounter (Signed)
Per RMR-  Daneil Dolin, MD  Claudina Lick, LPN; Theadora Rama        Send letter to patient.  Send copy of letter with path to referring provider and PCP.   Needs ov to set up repeat 6 tcs 6 months.

## 2016-08-01 ENCOUNTER — Ambulatory Visit: Payer: Medicare Other | Admitting: Gastroenterology

## 2016-08-16 DIAGNOSIS — I712 Thoracic aortic aneurysm, without rupture, unspecified: Secondary | ICD-10-CM

## 2016-08-16 HISTORY — DX: Thoracic aortic aneurysm, without rupture: I71.2

## 2016-08-16 HISTORY — DX: Thoracic aortic aneurysm, without rupture, unspecified: I71.20

## 2016-08-21 DIAGNOSIS — E782 Mixed hyperlipidemia: Secondary | ICD-10-CM | POA: Diagnosis not present

## 2016-08-21 DIAGNOSIS — E1122 Type 2 diabetes mellitus with diabetic chronic kidney disease: Secondary | ICD-10-CM | POA: Diagnosis not present

## 2016-08-22 ENCOUNTER — Ambulatory Visit (INDEPENDENT_AMBULATORY_CARE_PROVIDER_SITE_OTHER): Payer: Medicare Other | Admitting: Gastroenterology

## 2016-08-22 ENCOUNTER — Encounter: Payer: Self-pay | Admitting: Gastroenterology

## 2016-08-22 VITALS — BP 129/69 | HR 77 | Temp 98.3°F | Ht 74.0 in | Wt 209.6 lb

## 2016-08-22 DIAGNOSIS — Z860101 Personal history of adenomatous and serrated colon polyps: Secondary | ICD-10-CM

## 2016-08-22 DIAGNOSIS — R634 Abnormal weight loss: Secondary | ICD-10-CM

## 2016-08-22 DIAGNOSIS — Z86010 Personal history of colonic polyps: Secondary | ICD-10-CM | POA: Diagnosis not present

## 2016-08-22 NOTE — Progress Notes (Signed)
Primary Care Physician: Wende Neighbors, MD  Primary Gastroenterologist:  Garfield Cornea, MD   Chief Complaint  Patient presents with  . Weight Loss    HPI: Mark Le is a 82 y.o. male here for follow up. He was seen in 05/2016 for unintentional weight loss, loss of appetite. He has a personal history of colon cancer in 2002 status post resection and chemotherapy at that time. At that time he presented with obstructing carcinoma in the distal left colon and sigmoid colon junction and required left hemicolectomy with temporary colostomy.  Since April of 2017, patient states he has lost 50 pounds. No other gi complaints.   Patient had a CT abdomen and pelvis without contrast (dye allergy) 04/2016 with evidence of gallstones, enlarging right kidney lesion, aortoiliac arthrosclerosis. Follow-up MRI abdomen with and without contrast showed benign cyst in the kidneys, no evidence of renal neoplasm.  EGD and colonoscopy December 2017, large hiatal hernia 12 mm polyp found at the cecum growing out of the appendiceal orifice removed hot snare, piecemeal resection with 3 passes retrievable and resection felt to be complete, soft submucosal nodule in the cecum felt to be consistent with lipoma, 4 polyps in the ascending colon removed piecemeal as well, 25 mm polyps same segment removed, distal 5 cm of terminal ileum was normal. Cecal polyps showed tubulovillous adenoma. A ascending Colon polyp showed tubular adenoma with focal high-grade dysplasia. Plans for follow-up colonoscopy June 2018.  Eats breakfast out every day. Otherwise he would never leave his house. He watches TV for the most part. Couple of sugar-free cookies throughout the day. Bread with butter supper. No appetite. No abdominal pain. BM every few days, stools somewhat hard, very little. No straining. No melena or rectal bleeding. No dysphagia, heartburn.    Current Outpatient Prescriptions  Medication Sig Dispense Refill  .  amLODipine (NORVASC) 5 MG tablet Take 5 mg by mouth daily.    Marland Kitchen aspirin 81 MG tablet Take 160 mg by mouth daily.      . carvedilol (COREG) 12.5 MG tablet Take 12.5 mg by mouth 2 (two) times daily with a meal.      . fenofibrate (TRICOR) 145 MG tablet Take 145 mg by mouth daily.    . insulin glargine (LANTUS) 100 UNIT/ML injection Inject 20 Units into the skin daily.    Marland Kitchen lisinopril (PRINIVIL,ZESTRIL) 40 MG tablet Take 40 mg by mouth daily.    . simvastatin (ZOCOR) 20 MG tablet Take 20 mg by mouth daily.     No current facility-administered medications for this visit.     Allergies as of 08/22/2016 - Review Complete 08/22/2016  Allergen Reaction Noted  . Iohexol  05/21/2007    ROS:  General: Negative for anorexia, weight loss, fever, chills, fatigue, weakness. ENT: Negative for hoarseness, difficulty swallowing , nasal congestion. CV: Negative for chest pain, angina, palpitations, dyspnea on exertion, peripheral edema.  Respiratory: Negative for dyspnea at rest, dyspnea on exertion, cough, sputum, wheezing.  GI: See history of present illness. GU:  Negative for dysuria, hematuria, urinary incontinence, urinary frequency, nocturnal urination.  Endo: Negative for unusual weight change.    Physical Examination:   BP 129/69   Pulse 77   Temp 98.3 F (36.8 C) (Oral)   Ht 6\' 2"  (1.88 m)   Wt 209 lb 9.6 oz (95.1 kg)   BMI 26.91 kg/m   General: Well-nourished, well-developed in no acute distress.  Eyes: No icterus. Mouth: Oropharyngeal mucosa moist and  pink , no lesions erythema or exudate. Lungs: Clear to auscultation bilaterally.  Heart: Regular rate and rhythm, no murmurs rubs or gallops.  Abdomen: Bowel sounds are normal, nontender, nondistended, no hepatosplenomegaly or masses, no abdominal bruits or hernia , no rebound or guarding.   Extremities: No lower extremity edema. No clubbing or deformities. Neuro: Alert and oriented x 4   Skin: Warm and dry, no jaundice.   Psych:  Alert and cooperative, normal mood and affect.  Labs:  Requested: Patient was able to bring by labs that he had done 08/21/2016 for his PCP. His glucose was 106, creatinine 1.37, BUN 18, sodium 144, potassium 5.2, albumin 3.9, total bilirubin 0.7, alkaline phosphatase 31, AST 20, ALT 13, hemoglobin A1c 5.3, white blood cell count 4000, hemoglobin 13.3, hematocrit 40.1, MCV 94.1, platelets 253,000.  Imaging Studies: No results found.

## 2016-08-22 NOTE — Patient Instructions (Addendum)
1. Colonoscopy will be due in 12/2016 because you had advanced precancerous polyps that need to be followed up on to ensure they were completely removed.  2. Return to the office in 11/2016 to schedule your colonoscopy. 3. Please come by after your visit with Dr. Nevada Crane tomorrow and let us make a copy of your labs for our records here.  4. Please try to increase your food intake to maintain your weight. Consider adding supplement such as Glucerna. You may need to see a dietician to discuss calorie needs and help with diabetic and low potassium diet.

## 2016-08-23 ENCOUNTER — Other Ambulatory Visit: Payer: Self-pay

## 2016-08-23 ENCOUNTER — Encounter: Payer: Self-pay | Admitting: Gastroenterology

## 2016-08-23 DIAGNOSIS — N183 Chronic kidney disease, stage 3 (moderate): Secondary | ICD-10-CM | POA: Diagnosis not present

## 2016-08-23 DIAGNOSIS — E1122 Type 2 diabetes mellitus with diabetic chronic kidney disease: Secondary | ICD-10-CM | POA: Diagnosis not present

## 2016-08-23 DIAGNOSIS — I10 Essential (primary) hypertension: Secondary | ICD-10-CM | POA: Diagnosis not present

## 2016-08-23 DIAGNOSIS — Z87891 Personal history of nicotine dependence: Secondary | ICD-10-CM

## 2016-08-23 DIAGNOSIS — K635 Polyp of colon: Secondary | ICD-10-CM | POA: Diagnosis not present

## 2016-08-23 DIAGNOSIS — R634 Abnormal weight loss: Secondary | ICD-10-CM

## 2016-08-23 NOTE — Progress Notes (Signed)
Please let patient know that I reviewed his labs. Upper limits normal potassium in the setting of renal insufficiency.  Would recommend chest CT without contrast. Diagnosis abnormal weight loss, former smoker, exclude malignancy.  Also would recommend dietitian referral for evaluate for adequate caloric intake/weight loss/renal diet/diabetic diet.

## 2016-08-23 NOTE — Assessment & Plan Note (Signed)
Abnormal weight loss of 50 pounds over 6 month period of time, loss of appetite seen initially back in November 2017. Multiple colonic polyps removed, will need surveillance colonoscopy in June of this year. Upper endoscopy with large hiatal hernia but really nothing to explain weight loss or lack of appetite. Patient voices concern that he does not know what to eat because he has had issues with hyperkalemia in the setting of renal insufficiency.  Weight has been stable over the past 2 months. He continues to have lack of appetite which is concerning. Minimally active individual. Would recommend chest CT to evaluate for abnormal weight loss in a former smoker, exclude malignancy. Would also recommend seeing dietitian regarding diabetic/renal diet/adequate caloric intake.  He will continue to weigh weekly. He will contact us with further weight loss.

## 2016-08-23 NOTE — Progress Notes (Signed)
Pt is aware. He said it was ok to set up CT. Please schedule.   Pt doesn't want referral to dietician, he said he feels like he is doing ok right now.

## 2016-08-23 NOTE — Assessment & Plan Note (Signed)
Return to office in May 2018 to schedule surveillance colonoscopy for June 2018.

## 2016-08-23 NOTE — Progress Notes (Signed)
cc'ed to pcp °

## 2016-08-23 NOTE — Progress Notes (Signed)
CT chest without contrast scheduled for 08/31/16 at 4:00pm, arrive at 3:45pm. Liquids only 4 hours prior. Called and informed pt.

## 2016-08-31 ENCOUNTER — Ambulatory Visit (HOSPITAL_COMMUNITY)
Admission: RE | Admit: 2016-08-31 | Discharge: 2016-08-31 | Disposition: A | Discharge status: Home / Self Care | Payer: Medicare Other | Source: Ambulatory Visit | Attending: Gastroenterology | Admitting: Gastroenterology

## 2016-08-31 DIAGNOSIS — I2510 Atherosclerotic heart disease of native coronary artery without angina pectoris: Secondary | ICD-10-CM | POA: Insufficient documentation

## 2016-08-31 DIAGNOSIS — I712 Thoracic aortic aneurysm, without rupture: Secondary | ICD-10-CM | POA: Insufficient documentation

## 2016-08-31 DIAGNOSIS — R634 Abnormal weight loss: Secondary | ICD-10-CM

## 2016-08-31 DIAGNOSIS — D3501 Benign neoplasm of right adrenal gland: Secondary | ICD-10-CM | POA: Diagnosis not present

## 2016-08-31 DIAGNOSIS — R918 Other nonspecific abnormal finding of lung field: Secondary | ICD-10-CM | POA: Diagnosis not present

## 2016-08-31 DIAGNOSIS — K8020 Calculus of gallbladder without cholecystitis without obstruction: Secondary | ICD-10-CM | POA: Insufficient documentation

## 2016-08-31 DIAGNOSIS — K7689 Other specified diseases of liver: Secondary | ICD-10-CM | POA: Insufficient documentation

## 2016-08-31 DIAGNOSIS — Z87891 Personal history of nicotine dependence: Secondary | ICD-10-CM | POA: Insufficient documentation

## 2016-09-04 ENCOUNTER — Encounter: Payer: Self-pay | Admitting: Gastroenterology

## 2016-09-04 DIAGNOSIS — Z794 Long term (current) use of insulin: Secondary | ICD-10-CM | POA: Insufficient documentation

## 2016-09-04 DIAGNOSIS — H5213 Myopia, bilateral: Secondary | ICD-10-CM | POA: Diagnosis not present

## 2016-09-04 DIAGNOSIS — E119 Type 2 diabetes mellitus without complications: Secondary | ICD-10-CM | POA: Diagnosis not present

## 2016-09-04 DIAGNOSIS — H524 Presbyopia: Secondary | ICD-10-CM | POA: Diagnosis not present

## 2016-09-04 DIAGNOSIS — H52203 Unspecified astigmatism, bilateral: Secondary | ICD-10-CM | POA: Diagnosis not present

## 2016-09-04 DIAGNOSIS — H2513 Age-related nuclear cataract, bilateral: Secondary | ICD-10-CM | POA: Diagnosis not present

## 2016-09-04 NOTE — Progress Notes (Signed)
Nothing to explain weight loss. 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 He needs annual CTA or MRA. Please send copy to PCP and ask patient to follow up with PCP for this. There is ?nodular liver border?, no abnormality on CT or MR abd 3 months ago, no evidence of portal HTN EGD last year. LFTs normal. Likely insignificant finding.  Plan for 11/2016 ov. abd u/s liver 02/2017. ?cirrhosis.

## 2016-09-05 NOTE — Patient Instructions (Signed)
Your procedure is scheduled on:  09/11/2016               Report to [facility] at   8:30  AM.  Call this number if you have problems the morning of surgery: [phone]   Remember:   Do not eat or drink :After Midnight.    Take these medicines the morning of surgery with A SIP OF WATER:  Amlodipine, Coreg, lisinopril              Take Half dose of Lantus(10 units) night before Surgery          Do not wear jewelry, make-up or nail polish.  Do not wear lotions, powders, or perfumes. You may wear deodorant.  Do not bring valuables to the hospital.  Contacts, dentures or bridgework may not be worn into surgery.  Patients discharged the day of surgery will not be allowed to drive home.  Name and phone number of your driver:    @[email or web address] @ Cataract Surgery  A cataract is a clouding of the lens of the eye. When a lens becomes cloudy, vision is reduced based on the degree and nature of the clouding. Surgery may be needed to improve vision. Surgery removes the cloudy lens and usually replaces it with a substitute lens (intraocular lens, IOL). LET YOUR EYE DOCTOR KNOW ABOUT:  Allergies to food or medicine.   Medicines taken including herbs, eyedrops, over-the-counter medicines, and creams.   Use of steroids (by mouth or creams).   Previous problems with anesthetics or numbing medicine.   History of bleeding problems or blood clots.   Previous surgery.   Other health problems, including diabetes and kidney problems.   Possibility of pregnancy, if this applies.  RISKS AND COMPLICATIONS  Infection.   Inflammation of the eyeball (endophthalmitis) that can spread to both eyes (sympathetic ophthalmia).   Poor wound healing.   If an IOL is inserted, it can later fall out of proper position. This is very uncommon.   Clouding of the part of your eye that holds an IOL in place. This is called an "after-cataract." These are uncommon, but easily treated.  BEFORE THE  PROCEDURE  Do not eat or drink anything except small amounts of water for 8 to 12 before your surgery, or as directed by your caregiver.   Unless you are told otherwise, continue any eyedrops you have been prescribed.   Talk to your primary caregiver about all other medicines that you take (both prescription and non-prescription). In some cases, you may need to stop or change medicines near the time of your surgery. This is most important if you are taking blood-thinning medicine.Do not stop medicines unless you are told to do so.   Arrange for someone to drive you to and from the procedure.   Do not put contact lenses in either eye on the day of your surgery.  PROCEDURE There is more than one method for safely removing a cataract. Your doctor can explain the differences and help determine which is best for you. Phacoemulsification surgery is the most common form of cataract surgery.  An injection is given behind the eye or eyedrops are given to make this a painless procedure.   A small cut (incision) is made on the edge of the clear, dome-shaped surface that covers the front of the eye (cornea).   A tiny probe is painlessly inserted into the eye. This device gives off ultrasound waves that soften and break up the cloudy  center of the lens. This makes it easier for the cloudy lens to be removed by suction.   An IOL may be implanted.   The normal lens of the eye is covered by a clear capsule. Part of that capsule is intentionally left in the eye to support the IOL.   Your surgeon may or may not use stitches to close the incision.  There are other forms of cataract surgery that require a larger incision and stiches to close the eye. This approach is taken in cases where the doctor feels that the cataract cannot be easily removed using phacoemulsification. AFTER THE PROCEDURE  When an IOL is implanted, it does not need care. It becomes a permanent part of your eye and cannot be seen or  felt.   Your doctor will schedule follow-up exams to check on your progress.   Review your other medicines with your doctor to see which can be resumed after surgery.   Use eyedrops or take medicine as prescribed by your doctor.  Document Released: 06/21/2011 Document Reviewed: 06/18/2011 [facility] Patient Information 2012 [place].  .Cataract Surgery Care After Refer to this sheet in the next few weeks. These instructions provide you with information on caring for yourself after your procedure. Your caregiver may also give you more specific instructions. Your treatment has been planned according to current medical practices, but problems sometimes occur. Call your caregiver if you have any problems or questions after your procedure.  HOME CARE INSTRUCTIONS   Avoid strenuous activities as directed by your caregiver.   Ask your caregiver when you can resume driving.   Use eyedrops or other medicines to help healing and control pressure inside your eye as directed by your caregiver.   Only take over-the-counter or prescription medicines for pain, discomfort, or fever as directed by your caregiver.   Do not to touch or rub your eyes.   You may be instructed to use a protective shield during the first few days and nights after surgery. If not, wear sunglasses to protect your eyes. This is to protect the eye from pressure or from being accidentally bumped.   Keep the area around your eye clean and dry. Avoid swimming or allowing water to hit you directly in the face while showering. Keep soap and shampoo out of your eyes.   Do not bend or lift heavy objects. Bending increases pressure in the eye. You can walk, climb stairs, and do light household chores.   Do not put a contact lens into the eye that had surgery until your caregiver says it is okay to do so.   Ask your doctor when you can return to work. This will depend on the kind of work that you do. If you work in a dusty  environment, you may be advised to wear protective eyewear for a period of time.   Ask your caregiver when it will be safe to engage in sexual activity.   Continue with your regular eye exams as directed by your caregiver.  What to expect:  It is normal to feel itching and mild discomfort for a few days after cataract surgery. Some fluid discharge is also common, and your eye may be sensitive to light and touch.   After 1 to 2 days, even moderate discomfort should disappear. In most cases, healing will take about 6 weeks.   If you received an intraocular lens (IOL), you may notice that colors are very bright or have a blue tinge. Also, if  you have been in bright sunlight, everything may appear reddish for a few hours. If you see these color tinges, it is because your lens is clear and no longer cloudy. Within a few months after receiving an IOL, these extra colors should go away. When you have healed, you will probably need new glasses.  SEEK MEDICAL CARE IF:   You have increased bruising around your eye.   You have discomfort not helped by medicine.  SEEK IMMEDIATE MEDICAL CARE IF:   You have a fever.   You have a worsening or sudden vision loss.   You have redness, swelling, or increasing pain in the eye.   You have a thick discharge from the eye that had surgery.  MAKE SURE YOU:  Understand these instructions.   Will watch your condition.   Will get help right away if you are not doing well or get worse.  Document Released: 01/19/2005 Document Revised: 06/21/2011 Document Reviewed: 02/23/2011 [facility] Patient Information 2012 [place].    Monitored Anesthesia Care  Monitored anesthesia care is an anesthesia service for a medical procedure. Anesthesia is the loss of the ability to feel pain. It is produced by medications called anesthetics. It may affect a small area of your body (local anesthesia), a large area of your body (regional anesthesia), or your entire body  (general anesthesia). The need for monitored anesthesia care depends your procedure, your condition, and the potential need for regional or general anesthesia. It is often provided during procedures where:   General anesthesia may be needed if there are complications. This is because you need special care when you are under general anesthesia.   You will be under local or regional anesthesia. This is so that you are able to have higher levels of anesthesia if needed.   You will receive calming medications (sedatives). This is especially the case if sedatives are given to put you in a semi-conscious state of relaxation (deep sedation). This is because the amount of sedative needed to produce this state can be hard to predict. Too much of a sedative can produce general anesthesia. Monitored anesthesia care is performed by one or more caregivers who have special training in all types of anesthesia. You will need to meet with these caregivers before your procedure. During this meeting, they will ask you about your medical history. They will also give you instructions to follow. (For example, you will need to stop eating and drinking before your procedure. You may also need to stop or change medications you are taking.) During your procedure, your caregivers will stay with you. They will:   Watch your condition. This includes watching you blood pressure, breathing, and level of pain.   Diagnose and treat problems that occur.   Give medications if they are needed. These may include calming medications (sedatives) and anesthetics.   Make sure you are comfortable.  Having monitored anesthesia care does not necessarily mean that you will be under anesthesia. It does mean that your caregivers will be able to manage anesthesia if you need it or if it occurs. It also means that you will be able to have a different type of anesthesia than you are having if you need it. When your procedure is complete, your  caregivers will continue to watch your condition. They will make sure any medications wear off before you are allowed to go home.  Document Released: 03/28/2005 Document Revised: 10/27/2012 Document Reviewed: 08/13/2012 [facility] Patient Information 2014 [place], [place].

## 2016-09-06 ENCOUNTER — Encounter (HOSPITAL_COMMUNITY): Payer: Self-pay

## 2016-09-06 ENCOUNTER — Encounter (HOSPITAL_COMMUNITY)
Admission: RE | Admit: 2016-09-06 | Discharge: 2016-09-06 | Disposition: A | Discharge status: Home / Self Care | Payer: Medicare Other | Source: Ambulatory Visit | Attending: Ophthalmology | Admitting: Ophthalmology

## 2016-09-06 ENCOUNTER — Other Ambulatory Visit: Payer: Self-pay

## 2016-09-06 DIAGNOSIS — Z01810 Encounter for preprocedural cardiovascular examination: Secondary | ICD-10-CM | POA: Diagnosis not present

## 2016-09-06 DIAGNOSIS — H268 Other specified cataract: Secondary | ICD-10-CM | POA: Insufficient documentation

## 2016-09-06 DIAGNOSIS — Z01812 Encounter for preprocedural laboratory examination: Secondary | ICD-10-CM | POA: Diagnosis not present

## 2016-09-06 LAB — CBC
HCT: 36.9 % — ABNORMAL LOW (ref 39.0–52.0)
HEMOGLOBIN: 12.4 g/dL — AB (ref 13.0–17.0)
MCH: 31.8 pg (ref 26.0–34.0)
MCHC: 33.6 g/dL (ref 30.0–36.0)
MCV: 94.6 fL (ref 78.0–100.0)
Platelets: 180 10*3/uL (ref 150–400)
RBC: 3.9 MIL/uL — AB (ref 4.22–5.81)
RDW: 13.3 % (ref 11.5–15.5)
WBC: 4.3 10*3/uL (ref 4.0–10.5)

## 2016-09-06 LAB — BASIC METABOLIC PANEL
ANION GAP: 8 (ref 5–15)
BUN: 21 mg/dL — ABNORMAL HIGH (ref 6–20)
CHLORIDE: 107 mmol/L (ref 101–111)
CO2: 26 mmol/L (ref 22–32)
CREATININE: 1.28 mg/dL — AB (ref 0.61–1.24)
Calcium: 9 mg/dL (ref 8.9–10.3)
GFR calc non Af Amer: 52 mL/min — ABNORMAL LOW (ref >60)
GFR, EST AFRICAN AMERICAN: 60 mL/min — AB (ref >60)
Glucose, Bld: 126 mg/dL — ABNORMAL HIGH (ref 65–99)
Potassium: 4 mmol/L (ref 3.5–5.1)
SODIUM: 141 mmol/L (ref 135–145)

## 2016-09-11 ENCOUNTER — Ambulatory Visit (HOSPITAL_COMMUNITY)
Admission: RE | Admit: 2016-09-11 | Discharge: 2016-09-11 | Disposition: A | Discharge status: Home / Self Care | Payer: Medicare Other | Source: Ambulatory Visit | Attending: Ophthalmology | Admitting: Ophthalmology

## 2016-09-11 ENCOUNTER — Encounter (HOSPITAL_COMMUNITY): Payer: Self-pay | Admitting: *Deleted

## 2016-09-11 ENCOUNTER — Ambulatory Visit (HOSPITAL_COMMUNITY): Payer: Medicare Other | Admitting: Anesthesiology

## 2016-09-11 ENCOUNTER — Encounter (HOSPITAL_COMMUNITY)
Admission: RE | Disposition: A | Discharge status: Home / Self Care | Payer: Self-pay | Source: Ambulatory Visit | Attending: Ophthalmology

## 2016-09-11 DIAGNOSIS — I714 Abdominal aortic aneurysm, without rupture: Secondary | ICD-10-CM | POA: Insufficient documentation

## 2016-09-11 DIAGNOSIS — Z87891 Personal history of nicotine dependence: Secondary | ICD-10-CM | POA: Insufficient documentation

## 2016-09-11 DIAGNOSIS — E1136 Type 2 diabetes mellitus with diabetic cataract: Secondary | ICD-10-CM | POA: Diagnosis not present

## 2016-09-11 DIAGNOSIS — Z79899 Other long term (current) drug therapy: Secondary | ICD-10-CM | POA: Diagnosis not present

## 2016-09-11 DIAGNOSIS — Z794 Long term (current) use of insulin: Secondary | ICD-10-CM | POA: Insufficient documentation

## 2016-09-11 DIAGNOSIS — H269 Unspecified cataract: Secondary | ICD-10-CM | POA: Diagnosis not present

## 2016-09-11 DIAGNOSIS — H2512 Age-related nuclear cataract, left eye: Secondary | ICD-10-CM | POA: Insufficient documentation

## 2016-09-11 DIAGNOSIS — I10 Essential (primary) hypertension: Secondary | ICD-10-CM | POA: Insufficient documentation

## 2016-09-11 DIAGNOSIS — H2511 Age-related nuclear cataract, right eye: Secondary | ICD-10-CM | POA: Diagnosis not present

## 2016-09-11 DIAGNOSIS — I739 Peripheral vascular disease, unspecified: Secondary | ICD-10-CM | POA: Insufficient documentation

## 2016-09-11 HISTORY — PX: CATARACT EXTRACTION W/PHACO: SHX586

## 2016-09-11 LAB — GLUCOSE, CAPILLARY: Glucose-Capillary: 94 mg/dL (ref 65–99)

## 2016-09-11 SURGERY — PHACOEMULSIFICATION, CATARACT, WITH IOL INSERTION
Anesthesia: Monitor Anesthesia Care | Site: Eye | Laterality: Left

## 2016-09-11 MED ORDER — TETRACAINE HCL 0.5 % OP SOLN
1.0000 [drp] | OPHTHALMIC | Status: AC
  Administered 2016-09-11 (×3): 1 [drp] via OPHTHALMIC

## 2016-09-11 MED ORDER — CYCLOPENTOLATE-PHENYLEPHRINE 0.2-1 % OP SOLN
1.0000 [drp] | OPHTHALMIC | Status: AC
  Administered 2016-09-11 (×3): 1 [drp] via OPHTHALMIC

## 2016-09-11 MED ORDER — MIDAZOLAM HCL 2 MG/2ML IJ SOLN
INTRAMUSCULAR | Status: AC
  Filled 2016-09-11: qty 2

## 2016-09-11 MED ORDER — TETRACAINE 0.5 % OP SOLN OPTIME - NO CHARGE
OPHTHALMIC | Status: DC | PRN
Start: 2016-09-11 — End: 2016-09-11
  Administered 2016-09-11: 1 [drp] via OPHTHALMIC

## 2016-09-11 MED ORDER — LACTATED RINGERS IV SOLN
INTRAVENOUS | Status: DC
  Administered 2016-09-11: 1000 mL via INTRAVENOUS

## 2016-09-11 MED ORDER — BSS IO SOLN
INTRAOCULAR | Status: DC | PRN
  Administered 2016-09-11: 15 mL

## 2016-09-11 MED ORDER — KETOROLAC TROMETHAMINE 0.5 % OP SOLN
1.0000 [drp] | OPHTHALMIC | Status: AC
  Administered 2016-09-11 (×3): 1 [drp] via OPHTHALMIC

## 2016-09-11 MED ORDER — FENTANYL CITRATE (PF) 100 MCG/2ML IJ SOLN
25.0000 ug | Freq: Once | INTRAMUSCULAR | Status: AC
  Administered 2016-09-11: 25 ug via INTRAVENOUS

## 2016-09-11 MED ORDER — FENTANYL CITRATE (PF) 100 MCG/2ML IJ SOLN
INTRAMUSCULAR | Status: AC
  Filled 2016-09-11: qty 2

## 2016-09-11 MED ORDER — EPINEPHRINE PF 1 MG/ML IJ SOLN
INTRAOCULAR | Status: DC | PRN
  Administered 2016-09-11: 500 mL

## 2016-09-11 MED ORDER — MIDAZOLAM HCL 2 MG/2ML IJ SOLN
1.0000 mg | INTRAMUSCULAR | Status: AC
  Administered 2016-09-11: 2 mg via INTRAVENOUS

## 2016-09-11 MED ORDER — PHENYLEPHRINE HCL 2.5 % OP SOLN
1.0000 [drp] | OPHTHALMIC | Status: AC
  Administered 2016-09-11 (×3): 1 [drp] via OPHTHALMIC

## 2016-09-11 MED ORDER — PROVISC 10 MG/ML IO SOLN
INTRAOCULAR | Status: DC | PRN
  Administered 2016-09-11: 0.85 mL via INTRAOCULAR

## 2016-09-11 SURGICAL SUPPLY — 9 items
CLOTH BEACON ORANGE TIMEOUT ST (SAFETY) ×1 IMPLANT
EYE SHIELD UNIVERSAL CLEAR (GAUZE/BANDAGES/DRESSINGS) ×1 IMPLANT
GLOVE BIO SURGEON STRL SZ 6.5 (GLOVE) ×1 IMPLANT
GLOVE EXAM NITRILE MD LF STRL (GLOVE) ×1 IMPLANT
LENS ALC ACRYL/TECN (Ophthalmic Related) ×2 IMPLANT
PAD ARMBOARD 7.5X6 YLW CONV (MISCELLANEOUS) ×1 IMPLANT
TAPE SURG TRANSPORE 1 IN (GAUZE/BANDAGES/DRESSINGS) IMPLANT
TAPE SURGICAL TRANSPORE 1 IN (GAUZE/BANDAGES/DRESSINGS) ×1
WATER STERILE IRR 250ML POUR (IV SOLUTION) ×1 IMPLANT

## 2016-09-11 NOTE — Anesthesia Preprocedure Evaluation (Signed)
Anesthesia Evaluation  Patient identified by MRN, date of birth, ID band Patient awake    Reviewed: Allergy & Precautions, NPO status , Patient's Chart, lab work & pertinent test results  Airway Mallampati: II  TM Distance: >3 FB     Dental  (+) Edentulous Upper, Edentulous Lower   Pulmonary former smoker,    breath sounds clear to auscultation       Cardiovascular hypertension, Pt. on medications + Peripheral Vascular Disease (AAA)   Rhythm:Regular Rate:Normal     Neuro/Psych    GI/Hepatic   Endo/Other  diabetes, Type 2  Renal/GU      Musculoskeletal   Abdominal   Peds  Hematology   Anesthesia Other Findings   Reproductive/Obstetrics                             Anesthesia Physical Anesthesia Plan  ASA: III  Anesthesia Plan: MAC   Post-op Pain Management:    Induction: Intravenous  Airway Management Planned: Nasal Cannula  Additional Equipment:   Intra-op Plan:   Post-operative Plan:   Informed Consent: I have reviewed the patients History and Physical, chart, labs and discussed the procedure including the risks, benefits and alternatives for the proposed anesthesia with the patient or authorized representative who has indicated his/her understanding and acceptance.     Plan Discussed with:   Anesthesia Plan Comments:         Anesthesia Quick Evaluation  

## 2016-09-11 NOTE — Discharge Instructions (Signed)
PATIENT INSTRUCTIONS °POST-ANESTHESIA ° °IMMEDIATELY FOLLOWING SURGERY:  Do not drive or operate machinery for the first twenty four hours after surgery.  Do not make any important decisions for twenty four hours after surgery or while taking narcotic pain medications or sedatives.  If you develop intractable nausea and vomiting or a severe headache please notify your doctor immediately. ° °FOLLOW-UP:  Please make an appointment with your surgeon as instructed. You do not need to follow up with anesthesia unless specifically instructed to do so. ° °WOUND CARE INSTRUCTIONS (if applicable):  Keep a dry clean dressing on the anesthesia/puncture wound site if there is drainage.  Once the wound has quit draining you may leave it open to air.  Generally you should leave the bandage intact for twenty four hours unless there is drainage.  If the epidural site drains for more than 36-48 hours please call the anesthesia department. ° °QUESTIONS?:  Please feel free to call your physician or the hospital operator if you have any questions, and they will be happy to assist you.    °  °  °        [facility] Instructions °[address]- [place] [address] °    ° °1. Avoid closing eyes tightly. One often closes the eye tightly when laughing, talking, sneezing, coughing or if they feel irritated. At these times, you should be careful not to close your eyes tightly. ° °2. Instill eye drops as instructed. To instill drops in your eye, open it, look up and have someone gently pull the lower lid down and instill a couple of drops inside the lower lid. ° °3. Do not touch upper lid. ° °4. Take Advil or Tylenol for pain. ° °5. You may use either eye for near work, such as reading or sewing and you may watch television. ° °6. You may have your hair done at the beauty parlor at any time. ° °7. Wear dark glasses with or without your own glasses if you are in bright light. ° °8. Call our office at  [phone] or [phone] if you have sharp pain in your eye or unusual symptoms. ° °9.  FOLLOW UP WITH DR. SHAPIRO TODAY IN HIS  OFFICE AT 3:15 pm. ° °  °I have received a copy of the above instructions and will follow them.  ° ° ° °IF YOU ARE IN IMMEDIATE DANGER CALL 911! ° °It is important for you to keep your follow-up appointment with your physician after discharge, OR, for you /your caregiver to make a follow-up appointment with your physician / medical provider after discharge. ° °Show these instructions to the next healthcare provider you see. ° ° ° ° °

## 2016-09-11 NOTE — Transfer of Care (Signed)
Immediate Anesthesia Transfer of Care Note  Patient: Mark Le  Procedure(s) Performed: Procedure(s) with comments: CATARACT EXTRACTION PHACO AND INTRAOCULAR LENS PLACEMENT (IOC) (Left) - CDE: 19.23  Patient Location: Short Stay  Anesthesia Type:MAC  Level of Consciousness: awake  Airway & Oxygen Therapy: Patient Spontanous Breathing  Post-op Assessment: Report given to RN  Post vital signs: Reviewed  Last Vitals:  Vitals:   09/11/16 1015 09/11/16 1020  BP: 133/73 127/61  Resp: (!) 52 16  Temp:      Last Pain:  Vitals:   09/11/16 0957  TempSrc: Oral      Patients Stated Pain Goal: 7 (18/55/01 5868)  Complications: No apparent anesthesia complications

## 2016-09-11 NOTE — H&P (Signed)
The patient was re examined and there is no change in the patients condition since the original H and P. 

## 2016-09-11 NOTE — Op Note (Signed)
Patient brought to the operating room and prepped and draped in the usual manner.  Lid speculum inserted in left eye.  Stab incision made at the twelve o'clock position.  Provisc instilled in the anterior chamber.   A 2.4 mm. Stab incision was made temporally.  An anterior capsulotomy was done with a bent 25 gauge needle.  The nucleus was hydrodissected.  The Phaco tip was inserted in the anterior chamber and the nucleus was emulsified.  CDE was 19.23.  The cortical material was then removed with the I and A tip.  Posterior capsule was the polished.  The anterior chamber was deepened with Provisc.  A 17.5 Diopter Alcon AU00T0 IOL was then inserted in the capsular bag.  Provisc was then removed with the I and A tip.  The wound was then hydrated.  Patient sent to the Recovery Room in good condition with follow up in my office.  Preoperative Diagnosis:  Nuclear Cataract OS Postoperative Diagnosis:  Same Procedure name: Kelman Phacoemulsification OS with IOL

## 2016-09-11 NOTE — Anesthesia Postprocedure Evaluation (Signed)
Anesthesia Post Note  Patient: Mark Le  Procedure(s) Performed: Procedure(s) (LRB): CATARACT EXTRACTION PHACO AND INTRAOCULAR LENS PLACEMENT (IOC) (Left)  Patient location during evaluation: Short Stay Anesthesia Type: MAC Level of consciousness: awake and alert and oriented Pain management: pain level controlled Vital Signs Assessment: post-procedure vital signs reviewed and stable Respiratory status: spontaneous breathing Cardiovascular status: blood pressure returned to baseline Postop Assessment: no signs of nausea or vomiting Anesthetic complications: no     Last Vitals:  Vitals:   09/11/16 1015 09/11/16 1020  BP: 133/73 127/61  Resp: (!) 52 16  Temp:      Last Pain:  Vitals:   09/11/16 0957  TempSrc: Oral                 Katlen Seyer

## 2016-09-12 ENCOUNTER — Encounter (HOSPITAL_COMMUNITY): Payer: Self-pay | Admitting: Ophthalmology

## 2016-09-19 DIAGNOSIS — E1122 Type 2 diabetes mellitus with diabetic chronic kidney disease: Secondary | ICD-10-CM | POA: Diagnosis not present

## 2016-09-19 DIAGNOSIS — N183 Chronic kidney disease, stage 3 (moderate): Secondary | ICD-10-CM | POA: Diagnosis not present

## 2016-09-19 DIAGNOSIS — I10 Essential (primary) hypertension: Secondary | ICD-10-CM | POA: Diagnosis not present

## 2016-09-19 DIAGNOSIS — R634 Abnormal weight loss: Secondary | ICD-10-CM | POA: Diagnosis not present

## 2016-09-20 ENCOUNTER — Encounter (HOSPITAL_COMMUNITY)
Admission: RE | Admit: 2016-09-20 | Discharge: 2016-09-20 | Disposition: A | Discharge status: Home / Self Care | Payer: Medicare Other | Source: Ambulatory Visit | Attending: Ophthalmology | Admitting: Ophthalmology

## 2016-09-25 NOTE — H&P (Signed)
The patient was re examined and there is no change in the patients condition since the original H and P. 

## 2016-09-27 NOTE — Progress Notes (Signed)
PT is aware of results.  

## 2016-10-03 DIAGNOSIS — I10 Essential (primary) hypertension: Secondary | ICD-10-CM | POA: Diagnosis not present

## 2016-10-03 DIAGNOSIS — Z6826 Body mass index (BMI) 26.0-26.9, adult: Secondary | ICD-10-CM | POA: Diagnosis not present

## 2016-10-03 DIAGNOSIS — N183 Chronic kidney disease, stage 3 (moderate): Secondary | ICD-10-CM | POA: Diagnosis not present

## 2016-10-03 DIAGNOSIS — E1122 Type 2 diabetes mellitus with diabetic chronic kidney disease: Secondary | ICD-10-CM | POA: Diagnosis not present

## 2016-10-04 ENCOUNTER — Encounter (HOSPITAL_COMMUNITY)
Admission: RE | Admit: 2016-10-04 | Discharge: 2016-10-04 | Disposition: A | Discharge status: Home / Self Care | Payer: Medicare Other | Source: Ambulatory Visit | Attending: Ophthalmology | Admitting: Ophthalmology

## 2016-10-04 ENCOUNTER — Encounter (HOSPITAL_COMMUNITY): Payer: Self-pay

## 2016-10-09 ENCOUNTER — Ambulatory Visit (HOSPITAL_COMMUNITY): Payer: Medicare Other | Admitting: Anesthesiology

## 2016-10-09 ENCOUNTER — Ambulatory Visit (HOSPITAL_COMMUNITY)
Admission: RE | Admit: 2016-10-09 | Discharge: 2016-10-09 | Disposition: A | Discharge status: Home / Self Care | Payer: Medicare Other | Source: Ambulatory Visit | Attending: Ophthalmology | Admitting: Ophthalmology

## 2016-10-09 ENCOUNTER — Encounter (HOSPITAL_COMMUNITY): Payer: Self-pay | Admitting: *Deleted

## 2016-10-09 ENCOUNTER — Encounter (HOSPITAL_COMMUNITY)
Admission: RE | Disposition: A | Discharge status: Home / Self Care | Payer: Self-pay | Source: Ambulatory Visit | Attending: Ophthalmology

## 2016-10-09 DIAGNOSIS — I739 Peripheral vascular disease, unspecified: Secondary | ICD-10-CM | POA: Insufficient documentation

## 2016-10-09 DIAGNOSIS — E1136 Type 2 diabetes mellitus with diabetic cataract: Secondary | ICD-10-CM | POA: Diagnosis not present

## 2016-10-09 DIAGNOSIS — Z79899 Other long term (current) drug therapy: Secondary | ICD-10-CM | POA: Diagnosis not present

## 2016-10-09 DIAGNOSIS — H2511 Age-related nuclear cataract, right eye: Secondary | ICD-10-CM | POA: Diagnosis not present

## 2016-10-09 DIAGNOSIS — I10 Essential (primary) hypertension: Secondary | ICD-10-CM | POA: Insufficient documentation

## 2016-10-09 DIAGNOSIS — Z87891 Personal history of nicotine dependence: Secondary | ICD-10-CM | POA: Diagnosis not present

## 2016-10-09 HISTORY — PX: CATARACT EXTRACTION W/PHACO: SHX586

## 2016-10-09 LAB — GLUCOSE, CAPILLARY: GLUCOSE-CAPILLARY: 106 mg/dL — AB (ref 65–99)

## 2016-10-09 SURGERY — PHACOEMULSIFICATION, CATARACT, WITH IOL INSERTION
Anesthesia: Monitor Anesthesia Care | Site: Eye | Laterality: Right

## 2016-10-09 MED ORDER — BSS IO SOLN
INTRAOCULAR | Status: DC | PRN
  Administered 2016-10-09: 15 mL

## 2016-10-09 MED ORDER — MIDAZOLAM HCL 2 MG/2ML IJ SOLN
INTRAMUSCULAR | Status: AC
  Filled 2016-10-09: qty 2

## 2016-10-09 MED ORDER — FENTANYL CITRATE (PF) 100 MCG/2ML IJ SOLN
25.0000 ug | Freq: Once | INTRAMUSCULAR | Status: AC
  Administered 2016-10-09: 25 ug via INTRAVENOUS

## 2016-10-09 MED ORDER — TETRACAINE 0.5 % OP SOLN OPTIME - NO CHARGE
OPHTHALMIC | Status: DC | PRN
  Administered 2016-10-09: 2 [drp] via OPHTHALMIC

## 2016-10-09 MED ORDER — BSS IO SOLN
INTRAOCULAR | Status: DC | PRN
  Administered 2016-10-09: 500 mL

## 2016-10-09 MED ORDER — KETOROLAC TROMETHAMINE 0.5 % OP SOLN
1.0000 [drp] | OPHTHALMIC | Status: AC
  Administered 2016-10-09 (×3): 1 [drp] via OPHTHALMIC

## 2016-10-09 MED ORDER — MIDAZOLAM HCL 2 MG/2ML IJ SOLN
1.0000 mg | INTRAMUSCULAR | Status: AC
  Administered 2016-10-09: 2 mg via INTRAVENOUS

## 2016-10-09 MED ORDER — PHENYLEPHRINE HCL 2.5 % OP SOLN
1.0000 [drp] | OPHTHALMIC | Status: AC
  Administered 2016-10-09 (×3): 1 [drp] via OPHTHALMIC

## 2016-10-09 MED ORDER — CYCLOPENTOLATE-PHENYLEPHRINE 0.2-1 % OP SOLN
1.0000 [drp] | OPHTHALMIC | Status: AC
  Administered 2016-10-09 (×3): 1 [drp] via OPHTHALMIC

## 2016-10-09 MED ORDER — PROVISC 10 MG/ML IO SOLN
INTRAOCULAR | Status: DC | PRN
  Administered 2016-10-09: 0.85 mL via INTRAOCULAR

## 2016-10-09 MED ORDER — TETRACAINE HCL 0.5 % OP SOLN
1.0000 [drp] | OPHTHALMIC | Status: AC
  Administered 2016-10-09 (×3): 1 [drp] via OPHTHALMIC

## 2016-10-09 MED ORDER — LACTATED RINGERS IV SOLN
INTRAVENOUS | Status: DC
  Administered 2016-10-09: 08:00:00 via INTRAVENOUS

## 2016-10-09 MED ORDER — FENTANYL CITRATE (PF) 100 MCG/2ML IJ SOLN
INTRAMUSCULAR | Status: AC
  Filled 2016-10-09: qty 2

## 2016-10-09 SURGICAL SUPPLY — 10 items
CLOTH BEACON ORANGE TIMEOUT ST (SAFETY) ×2 IMPLANT
EYE SHIELD UNIVERSAL CLEAR (GAUZE/BANDAGES/DRESSINGS) ×2 IMPLANT
GLOVE BIOGEL PI IND STRL 6.5 (GLOVE) IMPLANT
GLOVE BIOGEL PI INDICATOR 6.5 (GLOVE) ×2
GLOVE EXAM NITRILE MD LF STRL (GLOVE) ×2 IMPLANT
LENS ALC ACRYL/TECN (Ophthalmic Related) ×3 IMPLANT
PAD ARMBOARD 7.5X6 YLW CONV (MISCELLANEOUS) ×2 IMPLANT
TAPE SURG TRANSPORE 1 IN (GAUZE/BANDAGES/DRESSINGS) IMPLANT
TAPE SURGICAL TRANSPORE 1 IN (GAUZE/BANDAGES/DRESSINGS) ×2
WATER STERILE IRR 250ML POUR (IV SOLUTION) ×2 IMPLANT

## 2016-10-09 NOTE — Op Note (Signed)
Patient brought to the operating room and prepped and draped in the usual manner.  Lid speculum inserted in right eye.  Stab incision made at the twelve o'clock position.  Provisc instilled in the anterior chamber.   A 2.4 mm. Stab incision was made temporally.  An anterior capsulotomy was done with a bent 25 gauge needle.  The nucleus was hydrodissected.  The Phaco tip was inserted in the anterior chamber and the nucleus was emulsified.  CDE was 8.08.  The cortical material was then removed with the I and A tip.  Posterior capsule was the polished.  The anterior chamber was deepened with Provisc.  A 18.0 Diopter Alcon AU00T0 IOL was then inserted in the capsular bag.  Provisc was then removed with the I and A tip.  The wound was then hydrated.  Patient sent to the Recovery Room in good condition with follow up in my office.  Preoperative Diagnosis:  Nuclear Cataract OD Postoperative Diagnosis:  Same Procedure name: Kelman Phacoemulsification OD with IOL

## 2016-10-09 NOTE — Discharge Instructions (Signed)
[facility] Instructions [place] [address]      1. Avoid closing eyes tightly. One often closes the eye tightly when laughing, talking, sneezing, coughing or if they feel irritated. At these times, you should be careful not to close your eyes tightly.  2. Instill eye drops as instructed. To instill drops in your eye, open it, look up and have someone gently pull the lower lid down and instill a couple of drops inside the lower lid.  3. Do not touch upper lid.  4. Take Advil or Tylenol for pain.  5. You may use either eye for near work, such as reading or sewing and you may watch television.  6. You may have your hair done at the beauty parlor at any time.  7. Wear dark glasses with or without your own glasses if you are in bright light.  8. Call our office at [phone] or [phone] if you have sharp pain in your eye or unusual symptoms.  9.  FOLLOW UP WITH DR. SHAPIRO TODAY IN HIS [place] OFFICE AT 3:15 pm.    I have received a copy of the above instructions and will follow them.     IF YOU ARE IN IMMEDIATE DANGER CALL 911!  It is important for you to keep your follow-up appointment with your physician after discharge, OR, for you /your caregiver to make a follow-up appointment with your physician / medical provider after discharge.  Show these instructions to the next healthcare provider you see.   Monitored Anesthesia Care, Care After These instructions provide you with information about caring for yourself after your procedure. Your health care provider may also give you more specific instructions. Your treatment has been planned according to current medical practices, but problems sometimes occur. Call your health care provider if you have any problems or questions after your procedure. What can I expect after the procedure? After your procedure, it is common to:  Feel sleepy for several  hours.  Feel clumsy and have poor balance for several hours.  Feel forgetful about what happened after the procedure.  Have poor judgment for several hours.  Feel nauseous or vomit.  Have a sore throat if you had a breathing tube during the procedure. Follow these instructions at home: For at least 24 hours after the procedure:    Do not:  Participate in activities in which you could fall or become injured.  Drive.  Use heavy machinery.  Drink alcohol.  Take sleeping pills or medicines that cause drowsiness.  Make important decisions or sign legal documents.  Take care of children on your own.  Rest. Eating and drinking   Follow the diet that is recommended by your health care provider.  If you vomit, drink water, juice, or soup when you can drink without vomiting.  Make sure you have little or no nausea before eating solid foods. General instructions   Have a responsible adult stay with you until you are awake and alert.  Take over-the-counter and prescription medicines only as told by your health care provider.  If you smoke, do not smoke without supervision.  Keep all follow-up visits as told by your health care provider. This is important. Contact a health care provider if:  You keep feeling nauseous or you keep vomiting.  You feel light-headed.  You develop a rash.  You have a fever. Get help right away if:  You  have trouble breathing. This information is not intended to replace advice given to you by your health care provider. Make sure you discuss any questions you have with your health care provider. Document Released: 10/23/2015 Document Revised: 02/22/2016 Document Reviewed: 10/23/2015 Elsevier Interactive Patient Education  2017 [employer].

## 2016-10-09 NOTE — Transfer of Care (Signed)
Immediate Anesthesia Transfer of Care Note  Patient: Mark Le  Procedure(s) Performed: Procedure(s) (LRB): CATARACT EXTRACTION PHACO AND INTRAOCULAR LENS PLACEMENT (IOC) (Right)  Patient Location: Shortstay  Anesthesia Type: MAC  Level of Consciousness: awake  Airway & Oxygen Therapy: Patient Spontanous Breathing   Post-op Assessment: Report given to PACU RN, Post -op Vital signs reviewed and stable and Patient moving all extremities  Post vital signs: Reviewed and stable  Complications: No apparent anesthesia complications

## 2016-10-09 NOTE — Anesthesia Procedure Notes (Signed)
Procedure Name: MAC Date/Time: 10/09/2016 8:54 AM Performed by: Vista Deck Pre-anesthesia Checklist: Patient identified, Emergency Drugs available, Suction available, Timeout performed and Patient being monitored Patient Re-evaluated:Patient Re-evaluated prior to inductionOxygen Delivery Method: Nasal Cannula

## 2016-10-09 NOTE — Anesthesia Preprocedure Evaluation (Signed)
Anesthesia Evaluation  Patient identified by MRN, date of birth, ID band Patient awake    Reviewed: Allergy & Precautions, NPO status , Patient's Chart, lab work & pertinent test results  Airway Mallampati: II  TM Distance: >3 FB     Dental  (+) Edentulous Upper, Edentulous Lower   Pulmonary former smoker,    breath sounds clear to auscultation       Cardiovascular hypertension, Pt. on medications + Peripheral Vascular Disease (AAA)   Rhythm:Regular Rate:Normal     Neuro/Psych    GI/Hepatic   Endo/Other  diabetes, Type 2  Renal/GU      Musculoskeletal   Abdominal   Peds  Hematology   Anesthesia Other Findings   Reproductive/Obstetrics                             Anesthesia Physical Anesthesia Plan  ASA: III  Anesthesia Plan: MAC   Post-op Pain Management:    Induction: Intravenous  Airway Management Planned: Nasal Cannula  Additional Equipment:   Intra-op Plan:   Post-operative Plan:   Informed Consent: I have reviewed the patients History and Physical, chart, labs and discussed the procedure including the risks, benefits and alternatives for the proposed anesthesia with the patient or authorized representative who has indicated his/her understanding and acceptance.     Plan Discussed with:   Anesthesia Plan Comments:         Anesthesia Quick Evaluation

## 2016-10-09 NOTE — Anesthesia Postprocedure Evaluation (Signed)
Anesthesia Post Note  Patient: Mark Le  Procedure(s) Performed: Procedure(s) (LRB): CATARACT EXTRACTION PHACO AND INTRAOCULAR LENS PLACEMENT (IOC) (Right)  Patient location during evaluation: Short Stay Anesthesia Type: MAC Level of consciousness: awake and alert Pain management: pain level controlled Vital Signs Assessment: post-procedure vital signs reviewed and stable Respiratory status: spontaneous breathing Anesthetic complications: no     Last Vitals:  Vitals:   10/09/16 0845 10/09/16 0913  BP: (!) 114/59 138/69  Pulse:  62  Resp: 14 14  Temp:  36.6 C    Last Pain:  Vitals:   10/09/16 0913  TempSrc: Oral                 Eleen Litz

## 2016-10-09 NOTE — H&P (Signed)
The patient was re examined and there is no change in the patients condition since the original H and P. 

## 2016-10-11 ENCOUNTER — Encounter (HOSPITAL_COMMUNITY): Payer: Self-pay | Admitting: Ophthalmology

## 2016-11-16 DIAGNOSIS — E1122 Type 2 diabetes mellitus with diabetic chronic kidney disease: Secondary | ICD-10-CM | POA: Diagnosis not present

## 2016-11-16 DIAGNOSIS — I10 Essential (primary) hypertension: Secondary | ICD-10-CM | POA: Diagnosis not present

## 2016-11-16 DIAGNOSIS — Z125 Encounter for screening for malignant neoplasm of prostate: Secondary | ICD-10-CM | POA: Diagnosis not present

## 2016-11-16 DIAGNOSIS — E782 Mixed hyperlipidemia: Secondary | ICD-10-CM | POA: Diagnosis not present

## 2016-11-19 ENCOUNTER — Encounter: Payer: Self-pay | Admitting: Gastroenterology

## 2016-11-19 ENCOUNTER — Ambulatory Visit (INDEPENDENT_AMBULATORY_CARE_PROVIDER_SITE_OTHER): Payer: Medicare Other | Admitting: Gastroenterology

## 2016-11-19 ENCOUNTER — Other Ambulatory Visit: Payer: Self-pay

## 2016-11-19 VITALS — BP 97/61 | HR 72 | Temp 97.0°F | Ht 74.0 in | Wt 202.4 lb

## 2016-11-19 DIAGNOSIS — Z86010 Personal history of colonic polyps: Secondary | ICD-10-CM

## 2016-11-19 DIAGNOSIS — I712 Thoracic aortic aneurysm, without rupture, unspecified: Secondary | ICD-10-CM

## 2016-11-19 DIAGNOSIS — R634 Abnormal weight loss: Secondary | ICD-10-CM

## 2016-11-19 DIAGNOSIS — R630 Anorexia: Secondary | ICD-10-CM

## 2016-11-19 DIAGNOSIS — K5900 Constipation, unspecified: Secondary | ICD-10-CM | POA: Diagnosis not present

## 2016-11-19 MED ORDER — POLYETHYLENE GLYCOL 3350 17 GM/SCOOP PO POWD: ORAL | 3 refills | Status: DC

## 2016-11-19 MED ORDER — PEG 3350-KCL-NA BICARB-NACL 420 G PO SOLR: 4000.0000 mL | ORAL | 0 refills | Status: DC

## 2016-11-19 NOTE — Assessment & Plan Note (Addendum)
Ongoing unintentional weight loss. Patient previously reported in April 2017 weighing 260 pounds. Clear decline in weight over the years in April 2009 weight 282 pounds, January 2013 249 pounds, November 2017 216 pounds. Has lost 14 pounds in the past 6 months. Patient describes significant anorexia but no abdominal pain, nausea or vomiting. He has known cholelithiasis but appear to be asymptomatic. Patient dietary recall suggestive of insufficient calories per day. Patient refused dietary consult. Question anorexia physiological versus related to depression. He is no longer on diabetes medications. Averaging about 2 pound weight loss per month at this point. I encouraged patient to eat even if he is not hungry. Work on increasing snacks throughout the day.  Proceed with colonoscopy as planned for follow-up of advanced colon polyps noted 6 months ago.  I have discussed the risks, alternatives, benefits with regards to but not limited to the risk of reaction to medication, bleeding, infection, perforation and the patient is agreeable to proceed. Written consent to be obtained.  Also plan for repeat imaging of the liver via ultrasound in August 2018 given? Nodular liver on chest CT, entirely were not evaluated in prior imaging via MRI and CT earlier last year showed unremarkable liver. I doubt significant finding.

## 2016-11-19 NOTE — Progress Notes (Signed)
CC'ED TO PCP 

## 2016-11-19 NOTE — Patient Instructions (Addendum)
1. Colonoscopy as scheduled. Please see separate instructions. 2. Please discuss with Dr. Nevada Crane tomorrow, you have an enlargement of the big blood vessel in your chest (thoracic aortic aneurysm) measuring 4.2 cm. You should have yearly MRA or CTA to check on this. Please ask Dr. Nevada Crane to follow this for you. 3. Please bring back a copy of your most recent bloodwork once you receive results. 4. We will plan on ultrasound of your liver in 02/2017 because your last CT scan of the chest questioned cirrhosis but we have not seen this on prior imaging earlier last year.  5. Start Miralax for constipation. Take one capful mixed in 4-6 ounces of liquid every evening on days you have not had a good soft bowel movement.  6. PLEASE EAT EVEN IF YOU ARE NOT HUNGRY. WE NEED TO STOP YOUR WEIGHT LOSS.

## 2016-11-19 NOTE — Progress Notes (Signed)
Primary Care Physician:  Celene Squibb, MD  Primary Gastroenterologist:  Garfield Cornea, MD   Chief Complaint  Patient presents with  . Weight Loss    f/u, "doesn't get hungry"  . Colonoscopy    consult    HPI:  Mark Le is a 82 y.o. male here for ongoing unintentional weight loss, lack of appetite, to schedule colonoscopy. Patient has a personal history of colon cancer in 2002 status post resection and chemotherapy at that time. Last seen in February 2018. CT abdomen pelvis without contrast (dye allergy) October 2017 with evidence of gallstones, enlarging right renal lesion, aortoiliac arthrosclerosis. Follow-up MRI abdomen with and without contrast showed benign cysts in the kidneys, no evidence of renal neoplasm.EGD and colonoscopy December 2017, large hiatal hernia 12 mm polyp found at the cecum growing out of the appendiceal orifice removed hot snare, piecemeal resection with 3 passes retrievable and resection felt to be complete, soft submucosal nodule in the cecum felt to be consistent with lipoma, 4 polyps in the ascending colon removed piecemeal as well, 25 mm polyps same segment removed, distal 5 cm of terminal ileum was normal. Cecal polyps showed tubulovillous adenoma. Ascending Colon polyp showed tubular adenoma with focal high-grade dysplasia. Plans for follow-up colonoscopy June 2018.  In February we will ordered CT chest without contrast for unintentional weight loss. He had mild aneurysm dilation of the ascending thoracic aorta measuring 4.2 cm. 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 Stones noted within the gallbladder. Otherwise gallbladder appeared unremarkable. Previous imaging of liver via CT and MRI 2017 without indication of cirrhosis. Plans for liver u/s in 02/2017.  10/2007: 282 lb 07/2011: 249 lb 05/2016: 216 lb 09/2016: 209 lb 11/2016: 202 lb  Feels like weight loss is slowing down. Eats breakfast well. 2  eggs, bacon, gravy biscuit. All the fluid he drinks has no calories. Never gets hungry. Eats just because he knowing needs to. May eat a little bit of cheese, a sandwich or sugar-free cookies later in the day. No longer on insulin. No other diabetes medications. States PCP has not really mentioned any concerns about his ongoing weight loss.Constipation since last colonoscopy with change in eating habits. BM most days but dry and hard. No n/v, abd pain, no melena, brbpr. No dysphagia, heartburn.  Discussed thoracic aortic aneurysm with patient, requested him mention it to Dr. Nevada Crane tomorrow to make sure they're aware that we want them to follow up on it yearly as recommended per radiology. Patient voiced understanding. He also will bring by a copy of his labs from Friday. In February he had preop labs for cataract surgery and had mild anemia which was new. Deceased be followed up on if not done on Friday.   Current Outpatient Prescriptions  Medication Sig Dispense Refill  . amLODipine (NORVASC) 5 MG tablet Take 5 mg by mouth daily.    Marland Kitchen aspirin 81 MG tablet Take 81 mg by mouth daily.     . carvedilol (COREG) 12.5 MG tablet Take 12.5 mg by mouth 2 (two) times daily with a meal.      . fenofibrate (TRICOR) 145 MG tablet Take by mouth daily. Takes 1/2 tablet daily    . lisinopril (PRINIVIL,ZESTRIL) 40 MG tablet Take 40 mg by mouth daily.    . Multiple Vitamins-Minerals (ONE-A-DAY MENS 50+ ADVANTAGE) TABS Take 1 tablet by mouth daily.    . simvastatin (ZOCOR) 20 MG tablet Take 20 mg by mouth daily.  No current facility-administered medications for this visit.     Allergies as of 11/19/2016 - Review Complete 11/19/2016  Allergen Reaction Noted  . Iohexol  05/21/2007    Past Medical History:  Diagnosis Date  . Aneurysm, thoracic aortic ([facility]) 08/2016   4.2cm  . Colon cancer ([place]) 2002   T3 N2 tumor with 2 of 4 positive lymph nodes, s/p resection and colostomy and post-op chemotherapy. reversal  of colostomy 2003.   . Diabetes mellitus   . Gout 06/04/2011  . Hypercholesteremia   . Hypertension   . Ventral hernia 07/24/2011    Past Surgical History:  Procedure Laterality Date  . CATARACT EXTRACTION W/PHACO Left 09/11/2016   Procedure: CATARACT EXTRACTION PHACO AND INTRAOCULAR LENS PLACEMENT (IOC);  Surgeon: Rutherford Guys, MD;  Location: [facility];  Service: Ophthalmology;  Laterality: Left;  CDE: 19.23  . CATARACT EXTRACTION W/PHACO Right 10/09/2016   Procedure: CATARACT EXTRACTION PHACO AND INTRAOCULAR LENS PLACEMENT (IOC);  Surgeon: Rutherford Guys, MD;  Location: [facility];  Service: Ophthalmology;  Laterality: Right;  CDE: 8.08  . COLONOSCOPY  11/2002   Dr. Alphonsa Overall: anastomosis at 45cm, otherwise unremarkable.  . COLONOSCOPY  05/2006   Dr. Alphonsa Overall: 4 polyps removed.  . COLONOSCOPY  07/2009   Dr. Alphonsa Overall: 2 tubular adenomas, next TCS five years.   . COLONOSCOPY N/A 06/20/2016   Dr. Gala Romney multiple colonic polyps removed, several in piecemeal fashion, a ascending colon polyp tubular adenoma with focal high-grade dysplasia, cecal polyps tubulovillous adenoma. Next colonoscopy June 2018  . COLOSTOMY REVERSAL  01/2002   Dr. Alphonsa Overall  . ESOPHAGOGASTRODUODENOSCOPY N/A 06/20/2016   Dr. Gala Romney: Large hiatal hernia  . PARTIAL COLECTOMY  04/2001   Dr. Alphonsa Overall: obstructing colon carcinoma of the left colon with resection and colostomy  . VENTRAL HERNIA REPAIR  04/2005   Dr. Alphonsa Overall    Family History  Problem Relation Age of Onset  . Colon cancer Neg Hx     Social History   Social History  . Marital status: Divorced    Spouse name: N/A  . Number of children: N/A  . Years of education: N/A   Occupational History  . Not on file.   Social History Main Topics  . Smoking status: Former Smoker    Packs/day: 1.50    Years: 15.00    Types: Cigarettes    Quit date: 09/06/1980  . Smokeless tobacco: Never Used  . Alcohol use No  . Drug use: No  . Sexual  activity: No   Other Topics Concern  . Not on file   Social History Narrative  . No narrative on file      ROS:  General: Negative for fever, chills, fatigue, weakness.See history of present illness Eyes: Negative for vision changes.  ENT: Negative for hoarseness, difficulty swallowing , nasal congestion. CV: Negative for chest pain, angina, palpitations, dyspnea on exertion, peripheral edema.  Respiratory: Negative for dyspnea at rest, dyspnea on exertion, cough, sputum, wheezing.  GI: See history of present illness. GU:  Negative for dysuria, hematuria, urinary incontinence, urinary frequency, nocturnal urination.  MS: Negative for joint pain, low back pain.  Derm: Negative for rash or itching.  Neuro: Negative for weakness, abnormal sensation, seizure, frequent headaches, memory loss, confusion.  Psych: Negative for anxiety, depression, suicidal ideation, hallucinations.  Endo: See history of present illness  Heme: Negative for bruising or bleeding. Allergy: Negative for rash or hives.    Physical Examination:  BP 97/61  Pulse 72   Temp 97 F (36.1 C) (Oral)   Ht 6\' 2"  (1.88 m)   Wt 202 lb 6.4 oz (91.8 kg)   BMI 25.99 kg/m    General: Well-nourished, well-developed in no acute distress. Somewhat disheveled Head: Normocephalic, atraumatic.   Eyes: Conjunctiva pink, no icterus. Mouth: Oropharyngeal mucosa moist and pink , no lesions erythema or exudate. Neck: Supple without thyromegaly, masses, or lymphadenopathy.  Lungs: Clear to auscultation bilaterally.  Heart: Regular rate and rhythm, no murmurs rubs or gallops.  Abdomen: Bowel sounds are normal, nontender, nondistended, no hepatosplenomegaly or masses, no abdominal bruits or    hernia , no rebound or guarding.   Rectal: Not performed Extremities: No lower extremity edema. No clubbing or deformities.  Neuro: Alert and oriented x 4 , grossly normal neurologically.  Skin: Warm and dry, no rash or jaundice.    Psych: Alert and cooperative, normal mood and affect.  Labs: 08/21/2016 for his PCP. His glucose was 106, creatinine 1.37, BUN 18, sodium 144, potassium 5.2, albumin 3.9, total bilirubin 0.7, alkaline phosphatase 31, AST 20, ALT 13, hemoglobin A1c 5.3, white blood cell count 4000, hemoglobin 13.3, hematocrit 40.1, MCV 94.1, platelets 253,000.  Imaging Studies: No results found.

## 2016-11-19 NOTE — Assessment & Plan Note (Signed)
Seen on chest CT recently. We sent a copy to PCP to request a follow-up on this yearly. We asked the patient to confirm this with his PCP tomorrow when he sees him.

## 2016-11-19 NOTE — Assessment & Plan Note (Signed)
Likely due to inadequate/diminished oral intake. Add MiraLAX 1 capful (17 g) every evening on days he does not have an adequate bowel movement. Prescription sent in.

## 2016-11-20 DIAGNOSIS — D631 Anemia in chronic kidney disease: Secondary | ICD-10-CM | POA: Diagnosis not present

## 2016-11-20 DIAGNOSIS — N183 Chronic kidney disease, stage 3 (moderate): Secondary | ICD-10-CM | POA: Diagnosis not present

## 2016-11-20 DIAGNOSIS — R634 Abnormal weight loss: Secondary | ICD-10-CM | POA: Diagnosis not present

## 2016-11-20 DIAGNOSIS — E782 Mixed hyperlipidemia: Secondary | ICD-10-CM | POA: Diagnosis not present

## 2016-11-20 DIAGNOSIS — E1122 Type 2 diabetes mellitus with diabetic chronic kidney disease: Secondary | ICD-10-CM | POA: Diagnosis not present

## 2016-11-20 DIAGNOSIS — I10 Essential (primary) hypertension: Secondary | ICD-10-CM | POA: Diagnosis not present

## 2016-12-27 ENCOUNTER — Encounter (HOSPITAL_COMMUNITY)
Admission: RE | Disposition: A | Discharge status: Home / Self Care | Payer: Self-pay | Source: Ambulatory Visit | Attending: Internal Medicine

## 2016-12-27 ENCOUNTER — Encounter (HOSPITAL_COMMUNITY): Payer: Self-pay | Admitting: *Deleted

## 2016-12-27 ENCOUNTER — Ambulatory Visit (HOSPITAL_COMMUNITY)
Admission: RE | Admit: 2016-12-27 | Discharge: 2016-12-27 | Disposition: A | Discharge status: Home / Self Care | Payer: Medicare Other | Source: Ambulatory Visit | Attending: Internal Medicine | Admitting: Internal Medicine

## 2016-12-27 DIAGNOSIS — Z1211 Encounter for screening for malignant neoplasm of colon: Secondary | ICD-10-CM | POA: Diagnosis not present

## 2016-12-27 DIAGNOSIS — Z87891 Personal history of nicotine dependence: Secondary | ICD-10-CM | POA: Diagnosis not present

## 2016-12-27 DIAGNOSIS — Z85038 Personal history of other malignant neoplasm of large intestine: Secondary | ICD-10-CM | POA: Insufficient documentation

## 2016-12-27 DIAGNOSIS — D120 Benign neoplasm of cecum: Secondary | ICD-10-CM | POA: Diagnosis not present

## 2016-12-27 DIAGNOSIS — Z7982 Long term (current) use of aspirin: Secondary | ICD-10-CM | POA: Insufficient documentation

## 2016-12-27 DIAGNOSIS — Z79899 Other long term (current) drug therapy: Secondary | ICD-10-CM | POA: Insufficient documentation

## 2016-12-27 DIAGNOSIS — E119 Type 2 diabetes mellitus without complications: Secondary | ICD-10-CM | POA: Diagnosis not present

## 2016-12-27 DIAGNOSIS — I712 Thoracic aortic aneurysm, without rupture: Secondary | ICD-10-CM | POA: Diagnosis not present

## 2016-12-27 DIAGNOSIS — Z86010 Personal history of colonic polyps: Secondary | ICD-10-CM | POA: Diagnosis not present

## 2016-12-27 DIAGNOSIS — R634 Abnormal weight loss: Secondary | ICD-10-CM

## 2016-12-27 DIAGNOSIS — Z933 Colostomy status: Secondary | ICD-10-CM | POA: Diagnosis not present

## 2016-12-27 DIAGNOSIS — K640 First degree hemorrhoids: Secondary | ICD-10-CM

## 2016-12-27 DIAGNOSIS — E7800 Pure hypercholesterolemia, unspecified: Secondary | ICD-10-CM | POA: Diagnosis not present

## 2016-12-27 DIAGNOSIS — D122 Benign neoplasm of ascending colon: Secondary | ICD-10-CM | POA: Diagnosis not present

## 2016-12-27 DIAGNOSIS — M109 Gout, unspecified: Secondary | ICD-10-CM | POA: Diagnosis not present

## 2016-12-27 DIAGNOSIS — I10 Essential (primary) hypertension: Secondary | ICD-10-CM | POA: Diagnosis not present

## 2016-12-27 DIAGNOSIS — D121 Benign neoplasm of appendix: Secondary | ICD-10-CM

## 2016-12-27 HISTORY — PX: COLONOSCOPY: SHX5424

## 2016-12-27 SURGERY — COLONOSCOPY
Anesthesia: Moderate Sedation

## 2016-12-27 MED ORDER — MIDAZOLAM HCL 5 MG/5ML IJ SOLN
INTRAMUSCULAR | Status: DC | PRN
  Administered 2016-12-27: 2 mg via INTRAVENOUS

## 2016-12-27 MED ORDER — STERILE WATER FOR IRRIGATION IR SOLN
Status: DC | PRN
  Administered 2016-12-27: 10:00:00

## 2016-12-27 MED ORDER — MEPERIDINE HCL 100 MG/ML IJ SOLN
INTRAMUSCULAR | Status: DC | PRN
  Administered 2016-12-27: 50 mg via INTRAVENOUS

## 2016-12-27 MED ORDER — SODIUM CHLORIDE 0.9 % IV SOLN
INTRAVENOUS | Status: DC
  Administered 2016-12-27: 10:00:00 via INTRAVENOUS

## 2016-12-27 MED ORDER — MIDAZOLAM HCL 5 MG/5ML IJ SOLN
INTRAMUSCULAR | Status: AC
  Filled 2016-12-27: qty 10

## 2016-12-27 MED ORDER — MEPERIDINE HCL 100 MG/ML IJ SOLN
INTRAMUSCULAR | Status: AC
  Filled 2016-12-27: qty 2

## 2016-12-27 MED ORDER — ONDANSETRON HCL 4 MG/2ML IJ SOLN
INTRAMUSCULAR | Status: AC
  Filled 2016-12-27: qty 2

## 2016-12-27 MED ORDER — ONDANSETRON HCL 4 MG/2ML IJ SOLN
INTRAMUSCULAR | Status: DC | PRN
  Administered 2016-12-27: 4 mg via INTRAVENOUS

## 2016-12-27 NOTE — Op Note (Signed)
[facility] Patient Name: Mark Le Procedure Date: 12/27/2016 10:07 AM MRN: [id number] Date of Birth: [birth year] Attending MD: Norvel Richards , MD CSN: [id number] Age: 82 Admit Type: Outpatient Procedure:                Colonoscopy Indications:              High risk colon cancer surveillance: Personal                            history of colonic polyps Providers:                Norvel Richards, MD, Janeece Riggers, RN, Randa Spike, Technician Referring MD:              Medicines:                Midazolam 5 mg IV, Meperidine 75 mg IV Complications:            No immediate complications. Estimated Blood Loss:     Estimated blood loss was minimal. Procedure:                Pre-Anesthesia Assessment:                           - Prior to the procedure, a History and Physical                            was performed, and patient medications and                            allergies were reviewed. The patient's tolerance of                            previous anesthesia was also reviewed. The risks                            and benefits of the procedure and the sedation                            options and risks were discussed with the patient.                            All questions were answered, and informed consent                            was obtained. Prior Anticoagulants: The patient has                            taken no previous anticoagulant or antiplatelet                            agents. ASA Grade Assessment: II - A patient with  mild systemic disease. After reviewing the risks                            and benefits, the patient was deemed in                            satisfactory condition to undergo the procedure.                           After obtaining informed consent, the colonoscope                            was passed under direct vision. Throughout the   procedure, the patient's blood pressure, pulse, and                            oxygen saturations were monitored continuously. The                            EC-[id number] ([id number]) scope was introduced through                            the anus and advanced to the the cecum, identified                            by appendiceal orifice and ileocecal valve. The                            colonoscopy was performed without difficulty. The                            patient tolerated the procedure well. The entire                            colon was well visualized. Anatomical landmarks                            were photographed. The quality of the bowel                            preparation was adequate. Scope In: 10:26:39 AM Scope Out: 10:45:53 AM Scope Withdrawal Time: 0 hours 13 minutes 42 seconds  Total Procedure Duration: 0 hours 19 minutes 14 seconds  Findings:      The perianal and digital rectal examinations were normal.      Internal hemorrhoids were found during retroflexion. The hemorrhoids       were Grade I (internal hemorrhoids that do not prolapse).      Three semi-pedunculated polyps were found in the ascending colon. These       polyps were removed with a cold snare. Resection and retrieval were       complete. Estimated blood loss was minimal.      A polyp was found in the appendiceal orifice. The polyp was sessile.       This was residual polyp tissue; it the polyp went down into the orifice  orifice of the appendix a total extent of which I could not determine. I       did piecemeal remove move additional fragments. This lesion felt not to       be totally removed today. Polyp resection was incomplete. The resected       tissue was retrieved. Estimated blood loss: none.      A 8 mm polyp was found in the ascending colon. The polyp was       semi-pedunculated. The polyp was removed with a hot snare. Resection and       retrieval were complete. Estimated blood loss:  none.      The exam was otherwise without abnormality on direct and retroflexion       views. Impression:               - Internal hemorrhoids.                           - Three polyps in the ascending colon, removed with                            a cold snare. Resected and retrieved.                           - One polyp at the appendiceal orifice, removed                            with a hot snare. Incomplete resection. Resected                            tissue retrieved.                           - One 8 mm polyp in the ascending colon, removed                            with a hot snare. Resected and retrieved.                           - The examination was otherwise normal on direct                            and retroflexion views. Patient may require an                            appendectomy to ensure complete removal of the                            appendical polyp. Scattered diverticulosis. Moderate Sedation:      Moderate (conscious) sedation was administered by the endoscopy nurse       and supervised by the endoscopist. The following parameters were       monitored: oxygen saturation, heart rate, blood pressure, respiratory       rate, EKG, adequacy of pulmonary ventilation, and response to care.       Total physician intraservice time was 18 minutes. Recommendation:           -  Patient has a contact number available for                            emergencies. The signs and symptoms of potential                            delayed complications were discussed with the                            patient. Return to normal activities tomorrow.                            Written discharge instructions were provided to the                            patient.                           - Resume previous diet.                           - Continue present medications.                           - Await pathology results.                           - Repeat colonoscopy date to be  determined after                            pending pathology results are reviewed for                            surveillance based on pathology results.                           - Return to GI office in 3 months. Procedure Code(s):        --- Professional ---                           [phone], Colonoscopy, flexible; with removal of                            tumor(s), polyp(s), or other lesion(s) by snare                            technique                           99152, Moderate sedation services provided by the                            same physician or other qualified health care                            professional performing the diagnostic or  therapeutic service that the sedation supports,                            requiring the presence of an independent trained                            observer to assist in the monitoring of the                            patient's level of consciousness and physiological                            status; initial 15 minutes of intraservice time,                            patient age 43 years or older Diagnosis Code(s):        --- Professional ---                           Z86.010, Personal history of colonic polyps                           D12.2, Benign neoplasm of ascending colon                           D12.1, Benign neoplasm of appendix                           K64.0, First degree hemorrhoids CPT copyright 2016 [employer]. All rights reserved. The codes documented in this report are preliminary and upon coder review may  be revised to meet current compliance requirements. Cristopher Estimable. Demian Maisel, MD Norvel Richards, MD 12/27/2016 11:28:18 AM This report has been signed electronically. Number of Addenda: 0

## 2016-12-27 NOTE — Discharge Instructions (Signed)
°Colonoscopy °Discharge Instructions ° °Read the instructions outlined below and refer to this sheet in the next few weeks. These discharge instructions provide you with general information on caring for yourself after you leave the hospital. Your doctor may also give you specific instructions. While your treatment has been planned according to the most current medical practices available, unavoidable complications occasionally occur. If you have any problems or questions after discharge, call Dr. Rourk at [phone]. °ACTIVITY °· You may resume your regular activity, but move at a slower pace for the next 24 hours.  °· Take frequent rest periods for the next 24 hours.  °· Walking will help get rid of the air and reduce the bloated feeling in your belly (abdomen).  °· No driving for 24 hours (because of the medicine (anesthesia) used during the test).   °· Do not sign any important legal documents or operate any machinery for 24 hours (because of the anesthesia used during the test).  °NUTRITION °· Drink plenty of fluids.  °· You may resume your normal diet as instructed by your doctor.  °· Begin with a light meal and progress to your normal diet. Heavy or fried foods are harder to digest and may make you feel sick to your stomach (nauseated).  °· Avoid alcoholic beverages for 24 hours or as instructed.  °MEDICATIONS °· You may resume your normal medications unless your doctor tells you otherwise.  °WHAT YOU CAN EXPECT TODAY °· Some feelings of bloating in the abdomen.  °· Passage of more gas than usual.  °· Spotting of blood in your stool or on the toilet paper.  °IF YOU HAD POLYPS REMOVED DURING THE COLONOSCOPY: °· No aspirin products for 7 days or as instructed.  °· No alcohol for 7 days or as instructed.  °· Eat a soft diet for the next 24 hours.  °FINDING OUT THE RESULTS OF YOUR TEST °Not all test results are available during your visit. If your test results are not back during the visit, make an appointment  with your caregiver to find out the results. Do not assume everything is normal if you have not heard from your caregiver or the medical facility. It is important for you to follow up on all of your test results.  °SEEK IMMEDIATE MEDICAL ATTENTION IF: °· You have more than a spotting of blood in your stool.  °· Your belly is swollen (abdominal distention).  °· You are nauseated or vomiting.  °· You have a temperature over 101.  °· You have abdominal pain or discomfort that is severe or gets worse throughout the day.  ° ° °Colon polyp and diverticulosis information provided ° °Further recommendations to follow pending review of pathology report ° ° °Colon Polyps °Polyps are tissue growths inside the body. Polyps can grow in many places, including the large intestine (colon). A polyp may be a round bump or a mushroom-shaped growth. You could have one polyp or several. °Most colon polyps are noncancerous (benign). However, some colon polyps can become cancerous over time. °What are the causes? °The exact cause of colon polyps is not known. °What increases the risk? °This condition is more likely to develop in people who: °· Have a family history of colon cancer or colon polyps. °· Are older than 50 or older than 45 if they are African American. °· Have inflammatory bowel disease, such as ulcerative colitis or Crohn disease. °· Are overweight. °· Smoke cigarettes. °· Do not get enough exercise. °· Drink too   much alcohol.  Eat a diet that is: ? High in fat and red meat. ? Low in fiber.  Had childhood cancer that was treated with abdominal radiation.  What are the signs or symptoms? Most polyps do not cause symptoms. If you have symptoms, they may include:  Blood coming from your rectum when having a bowel movement.  Blood in your stool.The stool may look dark red or black.  A change in bowel habits, such as constipation or diarrhea.  How is this diagnosed? This condition is diagnosed with a  colonoscopy. This is a procedure that uses a lighted, flexible scope to look at the inside of your colon. How is this treated? Treatment for this condition involves removing any polyps that are found. Those polyps will then be tested for cancer. If cancer is found, your health care provider will talk to you about options for colon cancer treatment. Follow these instructions at home: Diet  Eat plenty of fiber, such as fruits, vegetables, and whole grains.  Eat foods that are high in calcium and vitamin D, such as milk, cheese, yogurt, eggs, liver, fish, and broccoli.  Limit foods high in fat, red meats, and processed meats, such as hot dogs, sausage, bacon, and lunch meats.  Maintain a healthy weight, or lose weight if recommended by your health care provider. General instructions  Do not smoke cigarettes.  Do not drink alcohol excessively.  Keep all follow-up visits as told by your health care provider. This is important. This includes keeping regularly scheduled colonoscopies. Talk to your health care provider about when you need a colonoscopy.  Exercise every day or as told by your health care provider. Contact a health care provider if:  You have new or worsening bleeding during a bowel movement.  You have new or increased blood in your stool.  You have a change in bowel habits.  You unexpectedly lose weight.   Diverticulosis Diverticulosis is a condition that develops when small pouches (diverticula) form in the wall of the large intestine (colon). The colon is where water is absorbed and stool is formed. The pouches form when the inside layer of the colon pushes through weak spots in the outer layers of the colon. You may have a few pouches or many of them. What are the causes? The cause of this condition is not known. What increases the risk? The following factors may make you more likely to develop this condition:  Being older than age 3. Your risk for this condition  increases with age. Diverticulosis is rare among people younger than age 40. By age 41, many people have it.  Eating a low-fiber diet.  Having frequent constipation.  Being overweight.  Not getting enough exercise.  Smoking.  Taking over-the-counter pain medicines, like aspirin and ibuprofen.  Having a family history of diverticulosis.  What are the signs or symptoms? In most people, there are no symptoms of this condition. If you do have symptoms, they may include:  Bloating.  Cramps in the abdomen.  Constipation or diarrhea.  Pain in the lower left side of the abdomen.  How is this diagnosed? This condition is most often diagnosed during an exam for other colon problems. Because diverticulosis usually has no symptoms, it often cannot be diagnosed independently. This condition may be diagnosed by:  Using a flexible scope to examine the colon (colonoscopy).  Taking an X-ray of the colon after dye has been put into the colon (barium enema).  Doing a CT scan.  How is this treated? You may not need treatment for this condition if you have never developed an infection related to diverticulosis. If you have had an infection before, treatment may include:  Eating a high-fiber diet. This may include eating more fruits, vegetables, and grains.  Taking a fiber supplement.  Taking a live bacteria supplement (probiotic).  Taking medicine to relax your colon.  Taking antibiotic medicines.  Follow these instructions at home:  Drink 6-8 glasses of water or more each day to prevent constipation.  Try not to strain when you have a bowel movement.  If you have had an infection before: ? Eat more fiber as directed by your health care provider or your diet and nutrition specialist (dietitian). ? Take a fiber supplement or probiotic, if your health care provider approves.  Take over-the-counter and prescription medicines only as told by your health care provider.  If you were  prescribed an antibiotic, take it as told by your health care provider. Do not stop taking the antibiotic even if you start to feel better.  Keep all follow-up visits as told by your health care provider. This is important. Contact a health care provider if:  You have pain in your abdomen.  You have bloating.  You have cramps.  You have not had a bowel movement in 3 days. Get help right away if:  Your pain gets worse.  Your bloating becomes very bad.  You have a fever or chills, and your symptoms suddenly get worse.  You vomit.  You have bowel movements that are bloody or black.  You have bleeding from your rectum. Summary  Diverticulosis is a condition that develops when small pouches (diverticula) form in the wall of the large intestine (colon).  You may have a few pouches or many of them.  This condition is most often diagnosed during an exam for other colon problems.  If you have had an infection related to diverticulosis, treatment may include increasing the fiber in your diet, taking supplements, or taking medicines. This information is not intended to replace advice given to you by your health care provider. Make sure you discuss any questions you have with your health care provider. Document Released: 03/29/2004 Document Revised: 05/21/2016 Document Reviewed: 05/21/2016 Elsevier Interactive Patient Education  2017 [employer].

## 2016-12-27 NOTE — H&P (Signed)
@LOGO @   Primary Care Physician:  Celene Squibb, MD Primary Gastroenterologist:  Dr. Gala Romney  Pre-Procedure History & Physical: HPI:  Mark Le is a 82 y.o. male here for surveillance colonoscopy. History of multiple adenomas-1 ascending colon polyp with high-grade dysplasia 6 months ago. History of colon cancer  -  status post left segmental resection in 2002. Recent weight loss.  Past Medical History:  Diagnosis Date  . Aneurysm, thoracic aortic ([place]) 08/2016   4.2cm  . Colon cancer ([place]) 2002   T3 N2 tumor with 2 of 4 positive lymph nodes, s/p resection and colostomy and post-op chemotherapy. reversal of colostomy 2003.   . Diabetes mellitus   . Gout 06/04/2011  . Hypercholesteremia   . Hypertension   . Ventral hernia 07/24/2011    Past Surgical History:  Procedure Laterality Date  . CATARACT EXTRACTION W/PHACO Left 09/11/2016   Procedure: CATARACT EXTRACTION PHACO AND INTRAOCULAR LENS PLACEMENT (IOC);  Surgeon: Rutherford Guys, MD;  Location: [facility];  Service: Ophthalmology;  Laterality: Left;  CDE: 19.23  . CATARACT EXTRACTION W/PHACO Right 10/09/2016   Procedure: CATARACT EXTRACTION PHACO AND INTRAOCULAR LENS PLACEMENT (IOC);  Surgeon: Rutherford Guys, MD;  Location: [facility];  Service: Ophthalmology;  Laterality: Right;  CDE: 8.08  . COLONOSCOPY  11/2002   Dr. Alphonsa Overall: anastomosis at 45cm, otherwise unremarkable.  . COLONOSCOPY  05/2006   Dr. Alphonsa Overall: 4 polyps removed.  . COLONOSCOPY  07/2009   Dr. Alphonsa Overall: 2 tubular adenomas, next TCS five years.   . COLONOSCOPY N/A 06/20/2016   Dr. Gala Romney multiple colonic polyps removed, several in piecemeal fashion, a ascending colon polyp tubular adenoma with focal high-grade dysplasia, cecal polyps tubulovillous adenoma. Next colonoscopy June 2018  . COLOSTOMY REVERSAL  01/2002   Dr. Alphonsa Overall  . ESOPHAGOGASTRODUODENOSCOPY N/A 06/20/2016   Dr. Gala Romney: Large hiatal hernia  . PARTIAL COLECTOMY  04/2001   Dr. Alphonsa Overall:  obstructing colon carcinoma of the left colon with resection and colostomy  . VENTRAL HERNIA REPAIR  04/2005   Dr. Alphonsa Overall    Prior to Admission medications   Medication Sig Start Date End Date Taking? Authorizing Provider  amLODipine (NORVASC) 5 MG tablet Take 5 mg by mouth daily.   Yes [provider]  aspirin 81 MG tablet Take 81 mg by mouth daily.    Yes [provider]  carvedilol (COREG) 12.5 MG tablet Take 12.5 mg by mouth 2 (two) times daily with a meal.     Yes [provider]  fenofibrate (TRICOR) 145 MG tablet Take 72.5 mg by mouth daily. Takes 1/2 tablet daily   Yes [provider]  lisinopril (PRINIVIL,ZESTRIL) 40 MG tablet Take 40 mg by mouth daily.   Yes [provider]  Multiple Vitamins-Minerals (ONE-A-DAY MENS 50+ ADVANTAGE) TABS Take 1 tablet by mouth daily.   Yes [provider]  polyethylene glycol powder (GLYCOLAX/MIRALAX) powder Take 1 capful (17 g) every evening on days that you do not have a good soft bowel movement. Patient taking differently: Take 1 Container by mouth daily as needed for mild constipation. Take 1 capful (17 g) every evening on days that you do not have a good soft bowel movement. 11/19/16  Yes Mahala Menghini, PA-C  simvastatin (ZOCOR) 20 MG tablet Take 20 mg by mouth at bedtime.    Yes [provider]  polyethylene glycol-electrolytes (TRILYTE) 420 g solution Take 4,000 mLs by mouth as directed. 11/19/16   Rourk, Cristopher Estimable,  MD    Allergies as of 11/19/2016 - Review Complete 11/19/2016  Allergen Reaction Noted  . Iohexol  05/21/2007    Family History  Problem Relation Age of Onset  . Colon cancer Neg Hx     Social History   Social History  . Marital status: Divorced    Spouse name: N/A  . Number of children: N/A  . Years of education: N/A   Occupational History  . Not on file.   Social History Main Topics  . Smoking status: Former Smoker    Packs/day: 1.50    Years:  15.00    Types: Cigarettes    Quit date: 09/06/1980  . Smokeless tobacco: Never Used  . Alcohol use No  . Drug use: No  . Sexual activity: No   Other Topics Concern  . Not on file   Social History Narrative  . No narrative on file    Review of Systems: See HPI, otherwise negative ROS  Physical Exam: BP (!) 141/79   Pulse 77   Temp 98 F (36.7 C) (Oral)   Resp 15   Ht 6\' 2"  (1.88 m)   Wt 202 lb (91.6 kg)   SpO2 100%   BMI 25.94 kg/m  General:   Alert,  Well-developed, well-nourished, pleasant and cooperative in NAD Skin:  Intact without significant lesions or rashes. Neck:  Supple; no masses or thyromegaly. No significant cervical adenopathy. Lungs:  Clear throughout to auscultation.   No wheezes, crackles, or rhonchi. No acute distress. Heart:  Regular rate and rhythm; no murmurs, clicks, rubs,  or gallops. Abdomen: Non-distended, normal bowel sounds.  Soft and nontender without appreciable mass or hepatosplenomegaly.  Pulses:  Normal pulses noted. Extremities:  Without clubbing or edema.  Impression: Personal history of colon cancer  - status post resection 2002.   History of multiple adenomas 1 advanced removed in 2017. Patient here for surveillance examination. Recent weight loss.   Recommendations:  I have offered the patient a surveillance colonoscopy today.  The risks, benefits, limitations, alternatives and imponderables have been reviewed with the patient. Questions have been answered. All parties are agreeable.        Notice: This dictation was prepared with Dragon dictation along with smaller phrase technology. Any transcriptional errors that result from this process are unintentional and may not be corrected upon review.

## 2016-12-28 ENCOUNTER — Encounter: Payer: Self-pay | Admitting: Internal Medicine

## 2016-12-31 ENCOUNTER — Other Ambulatory Visit: Payer: Self-pay

## 2016-12-31 ENCOUNTER — Telehealth: Payer: Self-pay

## 2016-12-31 DIAGNOSIS — D369 Benign neoplasm, unspecified site: Secondary | ICD-10-CM

## 2016-12-31 NOTE — Telephone Encounter (Signed)
Referral has been made.

## 2016-12-31 NOTE — Telephone Encounter (Signed)
Per RMR-  Rourk, Cristopher Estimable, MD  Claudina Lick, LPN; Dedra Skeens, MD        Send letter to patient.  Send copy of letter with path to referring provider and PCP.   Let's make patient appointment see Dr. 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 Need surgical resection.

## 2016-12-31 NOTE — Telephone Encounter (Signed)
done

## 2016-12-31 NOTE — Telephone Encounter (Signed)
Letter mailed to the pt.  Please refer pt.  Please cc pcp.

## 2017-01-01 ENCOUNTER — Encounter (HOSPITAL_COMMUNITY): Payer: Self-pay | Admitting: Internal Medicine

## 2017-01-09 NOTE — Telephone Encounter (Signed)
Pt came by office and brought letter he received in the mail about needing to see surgeon for appendectomy. Referral had already been sent to [facility]. [facility] had faxed our office with appt 01/21/17 at 4:00pm. They were unable to contact pt by phone about appt and mailed letter to pt. Appt info, phone number, and address for [facility] given to pt.

## 2017-01-16 ENCOUNTER — Telehealth: Payer: Self-pay | Admitting: Gastroenterology

## 2017-01-16 NOTE — Telephone Encounter (Signed)
Received labs from pcp for 11/2016.  Hgb 12.4 slightly low.   Recheck cbc, iron/tibc, ferritin in two months.

## 2017-01-17 ENCOUNTER — Telehealth: Payer: Self-pay | Admitting: Internal Medicine

## 2017-01-17 ENCOUNTER — Other Ambulatory Visit: Payer: Self-pay | Admitting: Gastroenterology

## 2017-01-17 DIAGNOSIS — D649 Anemia, unspecified: Secondary | ICD-10-CM

## 2017-01-17 NOTE — Telephone Encounter (Signed)
Recall for abd ultrasound °

## 2017-01-17 NOTE — Telephone Encounter (Signed)
Letter mailed

## 2017-01-17 NOTE — Telephone Encounter (Signed)
Lab orders on file. 

## 2017-01-21 ENCOUNTER — Ambulatory Visit (HOSPITAL_COMMUNITY): Payer: Self-pay | Admitting: Surgery

## 2017-01-21 DIAGNOSIS — Z01818 Encounter for other preprocedural examination: Secondary | ICD-10-CM | POA: Diagnosis not present

## 2017-01-21 DIAGNOSIS — D120 Benign neoplasm of cecum: Secondary | ICD-10-CM | POA: Diagnosis not present

## 2017-02-04 ENCOUNTER — Other Ambulatory Visit: Payer: Self-pay

## 2017-02-04 DIAGNOSIS — K7460 Unspecified cirrhosis of liver: Secondary | ICD-10-CM

## 2017-02-05 DIAGNOSIS — Z961 Presence of intraocular lens: Secondary | ICD-10-CM | POA: Diagnosis not present

## 2017-02-06 ENCOUNTER — Ambulatory Visit (HOSPITAL_COMMUNITY)
Admission: RE | Admit: 2017-02-06 | Discharge: 2017-02-06 | Disposition: A | Discharge status: Home / Self Care | Payer: Medicare Other | Source: Ambulatory Visit | Attending: Gastroenterology | Admitting: Gastroenterology

## 2017-02-06 DIAGNOSIS — R932 Abnormal findings on diagnostic imaging of liver and biliary tract: Secondary | ICD-10-CM | POA: Diagnosis not present

## 2017-02-06 DIAGNOSIS — K7460 Unspecified cirrhosis of liver: Secondary | ICD-10-CM

## 2017-02-06 DIAGNOSIS — K8010 Calculus of gallbladder with chronic cholecystitis without obstruction: Secondary | ICD-10-CM | POA: Insufficient documentation

## 2017-02-06 DIAGNOSIS — K8020 Calculus of gallbladder without cholecystitis without obstruction: Secondary | ICD-10-CM | POA: Diagnosis not present

## 2017-02-07 NOTE — Progress Notes (Signed)
Cirrhosis on imaging. Repeat every 6 months. Is he having any pain? Gallbladder wall thickened but negative Murphy's sign.

## 2017-02-11 NOTE — Progress Notes (Signed)
ON RECALL  °

## 2017-02-12 ENCOUNTER — Other Ambulatory Visit: Payer: Self-pay

## 2017-02-12 DIAGNOSIS — D649 Anemia, unspecified: Secondary | ICD-10-CM

## 2017-02-13 ENCOUNTER — Encounter: Payer: Self-pay | Admitting: Cardiology

## 2017-02-13 NOTE — Progress Notes (Signed)
Cardiology Office Note  Date: 02/14/2017   ID: ARGIL MAHL, DOB 1934/11/04, MRN [id number]  PCP: Celene Squibb, MD  Consulting Cardiologist: Rozann Lesches, MD   Chief Complaint  Patient presents with  . Preoperative cardiac evaluation    History of Present Illness: Mark Le is an 82 y.o. male referred for cardiology consultation by Dr. Johney Maine for preoperative cardiac evaluation. He has been diagnosed with a cecal polyp extending into the appendiceal orifice and is being considered for laparoscopic appendectomy. He presents today for evaluation. Reports no known history of obstructive CAD or myocardial infarction. He admits that he is not very active at all, watches TV most of the time. He does not exercise. In this sedentary state he does not report any angina symptoms.  Chest CT imaging from February of this year demonstrated thoracic aortic aneurysmal dilatation at 4.2 cm. Also diffuse coronary artery calcifications were noted. I discussed these results with him today. He also has a history of type 2 diabetes mellitus, hypertension, and hyperlipidemia.  I reviewed his most recent ECG as outlined below. He has not undergone any formal ischemic testing.  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  Past Medical History:  Diagnosis Date  . Aneurysm, thoracic aortic ([place]) 08/2016   4.2cm  . Colon cancer ([place]) 2002   T3 N2 tumor with 2 of 4 positive lymph nodes, s/p resection and colostomy and post-op chemotherapy. reversal of colostomy 2003.   Marland Kitchen Essential hypertension   . Gout 06/04/2011  . Hyperlipidemia   . Type 2 diabetes mellitus ([place])   . Ventral hernia 07/24/2011    Past Surgical History:  Procedure Laterality Date  . CATARACT EXTRACTION W/PHACO Left 09/11/2016   Procedure: CATARACT EXTRACTION PHACO AND INTRAOCULAR LENS PLACEMENT (IOC);  Surgeon: Rutherford Guys, MD;  Location: [facility];  Service: Ophthalmology;   Laterality: Left;  CDE: 19.23  . CATARACT EXTRACTION W/PHACO Right 10/09/2016   Procedure: CATARACT EXTRACTION PHACO AND INTRAOCULAR LENS PLACEMENT (IOC);  Surgeon: Rutherford Guys, MD;  Location: [facility];  Service: Ophthalmology;  Laterality: Right;  CDE: 8.08  . COLONOSCOPY  11/2002   Dr. Alphonsa Overall: anastomosis at 45cm, otherwise unremarkable.  . COLONOSCOPY  05/2006   Dr. Alphonsa Overall: 4 polyps removed.  . COLONOSCOPY  07/2009   Dr. Alphonsa Overall: 2 tubular adenomas, next TCS five years.   . COLONOSCOPY N/A 06/20/2016   Dr. Gala Romney multiple colonic polyps removed, several in piecemeal fashion, a ascending colon polyp tubular adenoma with focal high-grade dysplasia, cecal polyps tubulovillous adenoma. Next colonoscopy June 2018  . COLONOSCOPY N/A 12/27/2016   Procedure: COLONOSCOPY;  Surgeon: Daneil Dolin, MD;  Location: [facility];  Service: Endoscopy;  Laterality: N/A;  1030   . COLOSTOMY REVERSAL  01/2002   Dr. Alphonsa Overall  . ESOPHAGOGASTRODUODENOSCOPY N/A 06/20/2016   Dr. Gala Romney: Large hiatal hernia  . PARTIAL COLECTOMY  04/2001   Dr. Alphonsa Overall: obstructing colon carcinoma of the left colon with resection and colostomy  . VENTRAL HERNIA REPAIR  04/2005   Dr. Alphonsa Overall    Current Outpatient Prescriptions  Medication Sig Dispense Refill  . amLODipine (NORVASC) 5 MG tablet Take 5 mg by mouth daily.    Marland Kitchen aspirin 81 MG tablet Take 81 mg by mouth daily.     . carvedilol (COREG) 12.5 MG tablet Take 12.5 mg by mouth 2 (two) times daily with a meal.      .  fenofibrate (TRICOR) 145 MG tablet Take 72.5 mg by mouth daily. Takes 1/2 tablet daily    . lisinopril (PRINIVIL,ZESTRIL) 40 MG tablet Take 40 mg by mouth daily.    . Multiple Vitamins-Minerals (ONE-A-DAY MENS 50+ ADVANTAGE) TABS Take 1 tablet by mouth daily.    . polyethylene glycol powder (GLYCOLAX/MIRALAX) powder Take 1 capful (17 g) every evening on days that you do not have a good soft bowel movement. (Patient taking  differently: Take 1 Container by mouth daily as needed for mild constipation. Take 1 capful (17 g) every evening on days that you do not have a good soft bowel movement.) 527 g 3  . polyethylene glycol-electrolytes (TRILYTE) 420 g solution Take 4,000 mLs by mouth as directed. 4000 mL 0  . simvastatin (ZOCOR) 20 MG tablet Take 20 mg by mouth at bedtime.      No current facility-administered medications for this visit.    Allergies:  Iohexol   Social History: The patient  reports that he quit smoking about 36 years ago. His smoking use included Cigarettes. He has a 22.50 pack-year smoking history. He has never used smokeless tobacco. He reports that he does not drink alcohol or use drugs.   Family History: The patient's family history is not on file.   ROS:  Please see the history of present illness. Otherwise, complete review of systems is positive for none.  All other systems are reviewed and negative.   Physical Exam: VS:  BP 128/70   Pulse 70   Ht 6\' 2"  (1.88 m)   Wt 197 lb (89.4 kg)   SpO2 97%   BMI 25.29 kg/m , BMI Body mass index is 25.29 kg/m.  Wt Readings from Last 3 Encounters:  02/14/17 197 lb (89.4 kg)  12/27/16 202 lb (91.6 kg)  11/19/16 202 lb 6.4 oz (91.8 kg)    General: Elderly male, appears comfortable at rest. HEENT: Conjunctiva and lids normal, oropharynx clear with dentures in place. Neck: Supple, no elevated JVP or carotid bruits, no thyromegaly. Lungs: Clear to auscultation, nonlabored breathing at rest. Cardiac: Regular rate and rhythm, no S3, soft systolic murmur, no pericardial rub. Abdomen: Soft, nontender, bowel sounds present, no guarding or rebound. Extremities: No pitting edema, distal pulses 2+. Skin: Warm and dry. Musculoskeletal: No kyphosis. Neuropsychiatric: Alert and oriented x3, affect grossly appropriate.  ECG: I personally reviewed the tracing from 09/06/2016 which showed sinus rhythm with prolonged PR interval, rule out old inferior infarct  pattern, low voltage.  Recent Labwork: 09/06/2016: BUN 21; Creatinine, Ser 1.28; Hemoglobin 12.4; Platelets 180; Potassium 4.0; Sodium 141   Other Studies Reviewed Today:  Chest CT 08/31/2016: IMPRESSION: 1. No mass is seen within the chest. 2. Mild aneurysmal dilatation of the ascending thoracic aorta to 4.2 cm in AP dimension. Recommend annual imaging followup by CTA or MRA. This recommendation follows 2010 ACCF/AHA/AATS/ACR/ASA/SCA/SCAI/SIR/STS/SVM Guidelines for the Diagnosis and Management of Patients with Thoracic Aortic Disease. Circulation. 2010; 121: R740-C144 3. Diffuse coronary artery calcifications seen. 4. Nodular contour of the liver raises question for mild hepatic cirrhosis. Would correlate with LFTs. 5. Cholelithiasis.  Gallbladder otherwise grossly unremarkable. 6. 1.3 cm benign right adrenal adenoma incidentally noted.  Assessment and Plan:  1. Preoperative evaluation in an 82 year old male with history of hypertension, hyperlipidemia, type 2 diabetes mellitus, mild aneurysmal dilatation of the ascending aorta, and coronary artery calcifications by CT imaging. He is not reporting any obvious angina but is very sedentary at baseline. Most recent ECG reviewed above. For further  objective assessment we will arrange a Lexiscan Myoview, mainly to exclude high risk features that would require further workup. Further recommendations to follow.  2. Mild aneurysmal dilatation of the ascending thoracic aorta. He is asymptomatic. This does not represent a barrier to planned surgery.  3. Essential hypertension, on Norvasc, Coreg, and lisinopril. He follows with Dr. Nevada Crane. Blood pressure is adequately controlled today.  4. Hyperlipidemia, on Zocor and TriCor. He follows with Dr. Nevada Crane.  Current medicines were reviewed with the patient today.   Orders Placed This Encounter  Procedures  . NM Myocar Multi W/Spect W/Wall Motion / EF    Disposition: Call with test  results.  Signed, Satira Sark, MD, [facility] 02/14/2017 3:10 PM    [place] [facility] [address], [place], [place] [address] Phone: [phone]; Fax: [phone]

## 2017-02-14 ENCOUNTER — Encounter: Payer: Self-pay | Admitting: Cardiology

## 2017-02-14 ENCOUNTER — Ambulatory Visit (INDEPENDENT_AMBULATORY_CARE_PROVIDER_SITE_OTHER): Payer: Medicare Other | Admitting: Cardiology

## 2017-02-14 VITALS — BP 128/70 | HR 70 | Ht 74.0 in | Wt 197.0 lb

## 2017-02-14 DIAGNOSIS — I2510 Atherosclerotic heart disease of native coronary artery without angina pectoris: Secondary | ICD-10-CM

## 2017-02-14 DIAGNOSIS — I712 Thoracic aortic aneurysm, without rupture, unspecified: Secondary | ICD-10-CM

## 2017-02-14 DIAGNOSIS — E782 Mixed hyperlipidemia: Secondary | ICD-10-CM

## 2017-02-14 DIAGNOSIS — I10 Essential (primary) hypertension: Secondary | ICD-10-CM | POA: Diagnosis not present

## 2017-02-14 DIAGNOSIS — Z01810 Encounter for preprocedural cardiovascular examination: Secondary | ICD-10-CM | POA: Diagnosis not present

## 2017-02-14 NOTE — Patient Instructions (Signed)
Your physician recommends that you schedule a follow-up appointment in:  To be determined after test.  We will call you with results.    Your physician has requested that you have a lexiscan myoview. For further information please visit [email or web address]. Please follow instruction sheet, as given.     Your physician recommends that you continue on your current medications as directed. Please refer to the Current Medication list given to you today.       Thank you for choosing [facility] !

## 2017-02-19 ENCOUNTER — Encounter (HOSPITAL_COMMUNITY)
Admission: RE | Admit: 2017-02-19 | Discharge: 2017-02-19 | Disposition: A | Discharge status: Home / Self Care | Payer: Medicare Other | Source: Ambulatory Visit | Attending: Cardiology | Admitting: Cardiology

## 2017-02-19 ENCOUNTER — Encounter (HOSPITAL_COMMUNITY): Payer: Self-pay

## 2017-02-19 ENCOUNTER — Encounter (HOSPITAL_BASED_OUTPATIENT_CLINIC_OR_DEPARTMENT_OTHER)
Admission: RE | Admit: 2017-02-19 | Discharge: 2017-02-19 | Disposition: A | Discharge status: Home / Self Care | Payer: Medicare Other | Source: Ambulatory Visit | Attending: Cardiology | Admitting: Cardiology

## 2017-02-19 DIAGNOSIS — Z01810 Encounter for preprocedural cardiovascular examination: Secondary | ICD-10-CM

## 2017-02-19 DIAGNOSIS — I2510 Atherosclerotic heart disease of native coronary artery without angina pectoris: Secondary | ICD-10-CM | POA: Diagnosis not present

## 2017-02-19 LAB — NM MYOCAR MULTI W/SPECT W/WALL MOTION / EF
CHL CUP NUCLEAR SDS: 3
LV dias vol: 95 mL (ref 62–150)
LVSYSVOL: 34 mL
Peak HR: 90 {beats}/min
RATE: 1.05
Rest HR: 49 {beats}/min
SRS: 6
SSS: 9
TID: 1.05

## 2017-02-19 MED ORDER — REGADENOSON 0.4 MG/5ML IV SOLN
INTRAVENOUS | Status: AC
  Administered 2017-02-19: 0.4 mg via INTRAVENOUS
  Filled 2017-02-19: qty 5

## 2017-02-19 MED ORDER — SODIUM CHLORIDE 0.9% FLUSH
INTRAVENOUS | Status: AC
  Administered 2017-02-19: 10 mL via INTRAVENOUS
  Filled 2017-02-19: qty 10

## 2017-02-19 MED ORDER — SODIUM CHLORIDE 0.9% FLUSH
INTRAVENOUS | Status: AC
  Filled 2017-02-19: qty 180

## 2017-02-19 MED ORDER — TECHNETIUM TC 99M TETROFOSMIN IV KIT
30.0000 | PACK | Freq: Once | INTRAVENOUS | Status: AC | PRN
  Administered 2017-02-19: 30 via INTRAVENOUS

## 2017-02-19 MED ORDER — TECHNETIUM TC 99M TETROFOSMIN IV KIT
10.0000 | PACK | Freq: Once | INTRAVENOUS | Status: AC | PRN
  Administered 2017-02-19: 10 via INTRAVENOUS

## 2017-02-20 DIAGNOSIS — E1122 Type 2 diabetes mellitus with diabetic chronic kidney disease: Secondary | ICD-10-CM | POA: Diagnosis not present

## 2017-02-20 DIAGNOSIS — I10 Essential (primary) hypertension: Secondary | ICD-10-CM | POA: Diagnosis not present

## 2017-02-21 ENCOUNTER — Telehealth: Payer: Self-pay

## 2017-02-21 ENCOUNTER — Encounter: Payer: Self-pay | Admitting: Internal Medicine

## 2017-02-21 DIAGNOSIS — Z01818 Encounter for other preprocedural examination: Secondary | ICD-10-CM

## 2017-02-21 NOTE — Telephone Encounter (Signed)
Pt made aware, he voiced understanding. I will send a copy to pcp. I will forward to [facility] to schedule echo.

## 2017-02-21 NOTE — Telephone Encounter (Signed)
-----   Message from Satira Sark, MD sent at 02/20/2017  6:14 PM EDT ----- Results reviewed. Overall low risk stress test. There is question of old inferior infarct scar but not ischemia and no wall motion abnormalities indicated. Would get echocardiogram to further evaluate LVEF to be complete, but I would that he can go ahead with surgical planning as his perioperative cardiac risk is low to intermediate range based at this point. A copy of this test should be forwarded to Celene Squibb, MD.

## 2017-02-22 DIAGNOSIS — R634 Abnormal weight loss: Secondary | ICD-10-CM | POA: Diagnosis not present

## 2017-02-22 DIAGNOSIS — E782 Mixed hyperlipidemia: Secondary | ICD-10-CM | POA: Diagnosis not present

## 2017-02-22 DIAGNOSIS — D631 Anemia in chronic kidney disease: Secondary | ICD-10-CM | POA: Diagnosis not present

## 2017-02-22 DIAGNOSIS — E1122 Type 2 diabetes mellitus with diabetic chronic kidney disease: Secondary | ICD-10-CM | POA: Diagnosis not present

## 2017-02-22 DIAGNOSIS — I10 Essential (primary) hypertension: Secondary | ICD-10-CM | POA: Diagnosis not present

## 2017-02-22 DIAGNOSIS — K635 Polyp of colon: Secondary | ICD-10-CM | POA: Diagnosis not present

## 2017-02-22 DIAGNOSIS — N183 Chronic kidney disease, stage 3 (moderate): Secondary | ICD-10-CM | POA: Diagnosis not present

## 2017-02-22 DIAGNOSIS — Z6826 Body mass index (BMI) 26.0-26.9, adult: Secondary | ICD-10-CM | POA: Diagnosis not present

## 2017-02-24 NOTE — Progress Notes (Signed)
Agree with above recommendations. Ov next month as planned.

## 2017-03-04 ENCOUNTER — Ambulatory Visit (HOSPITAL_COMMUNITY)
Admission: RE | Admit: 2017-03-04 | Discharge: 2017-03-04 | Disposition: A | Discharge status: Home / Self Care | Payer: Medicare Other | Source: Ambulatory Visit | Attending: Cardiology | Admitting: Cardiology

## 2017-03-04 DIAGNOSIS — Z87891 Personal history of nicotine dependence: Secondary | ICD-10-CM | POA: Diagnosis not present

## 2017-03-04 DIAGNOSIS — E119 Type 2 diabetes mellitus without complications: Secondary | ICD-10-CM | POA: Diagnosis not present

## 2017-03-04 DIAGNOSIS — Z01818 Encounter for other preprocedural examination: Secondary | ICD-10-CM | POA: Insufficient documentation

## 2017-03-04 DIAGNOSIS — I119 Hypertensive heart disease without heart failure: Secondary | ICD-10-CM | POA: Insufficient documentation

## 2017-03-04 NOTE — Progress Notes (Signed)
*  PRELIMINARY RESULTS* Echocardiogram 2D Echocardiogram has been performed.  Leavy Cella 03/04/2017, 11:57 AM

## 2017-03-12 DIAGNOSIS — Z23 Encounter for immunization: Secondary | ICD-10-CM | POA: Diagnosis not present

## 2017-03-20 DIAGNOSIS — D649 Anemia, unspecified: Secondary | ICD-10-CM | POA: Diagnosis not present

## 2017-03-20 LAB — IRON, TOTAL/TOTAL IRON BINDING CAP
%SAT: 37 % (calc) (ref 15–60)
Iron: 125 ug/dL (ref 50–180)
TIBC: 336 mcg/dL (calc) (ref 250–425)

## 2017-03-20 LAB — CBC WITH DIFFERENTIAL/PLATELET
BASOS ABS: 30 {cells}/uL (ref 0–200)
Basophils Relative: 0.7 %
EOS ABS: 108 {cells}/uL (ref 15–500)
Eosinophils Relative: 2.5 %
HCT: 39.1 % (ref 38.5–50.0)
HEMOGLOBIN: 13.2 g/dL (ref 13.2–17.1)
Lymphs Abs: 1415 cells/uL (ref 850–3900)
MCH: 31.1 pg (ref 27.0–33.0)
MCHC: 33.8 g/dL (ref 32.0–36.0)
MCV: 92.2 fL (ref 80.0–100.0)
MONOS PCT: 8.3 %
MPV: 9.7 fL (ref 7.5–12.5)
NEUTROS ABS: 2391 {cells}/uL (ref 1500–7800)
Neutrophils Relative %: 55.6 %
Platelets: 250 10*3/uL (ref 140–400)
RBC: 4.24 10*6/uL (ref 4.20–5.80)
RDW: 13.6 % (ref 11.0–15.0)
Total Lymphocyte: 32.9 %
WBC mixed population: 357 cells/uL (ref 200–950)
WBC: 4.3 10*3/uL (ref 3.8–10.8)

## 2017-03-24 NOTE — Progress Notes (Signed)
Cbc and iron ok. Recommend keep upcoming ov for weight loss

## 2017-04-12 ENCOUNTER — Encounter: Payer: Self-pay | Admitting: Gastroenterology

## 2017-04-12 ENCOUNTER — Ambulatory Visit (INDEPENDENT_AMBULATORY_CARE_PROVIDER_SITE_OTHER): Payer: Medicare Other | Admitting: Gastroenterology

## 2017-04-12 VITALS — BP 136/76 | HR 66 | Temp 97.1°F | Ht 74.0 in | Wt 197.8 lb

## 2017-04-12 DIAGNOSIS — R634 Abnormal weight loss: Secondary | ICD-10-CM

## 2017-04-12 DIAGNOSIS — I2510 Atherosclerotic heart disease of native coronary artery without angina pectoris: Secondary | ICD-10-CM

## 2017-04-12 NOTE — Progress Notes (Signed)
Primary Care Physician: Celene Squibb, MD  Primary Gastroenterologist:  Garfield Cornea, MD   Chief Complaint  Patient presents with  . post procedure f/u    had tcs    HPI: Mark Le is a 82 y.o. male here for follow-up. History of multiple adenomatous colon polyps, 1 growing out of the appendiceal orifice. Pathology revealed tubulovillous adenoma. Entire polyp could not be removed on second TCS in June. Appendectomy recommended and he is scheduled for 04/26/17.   Clinically patient is stable. His weight is down a couple more pounds. He has had an extensive evaluation weight loss as previously outlined in 11/19/16 OV note. He denies abdominal pain, constipation, diarrhea, melena,  Rectal bleeding. No heartburn or dysphagia. Appetite is poor. He always consumes a good breakfast, A sandwich later in the day, sugar-free cookies.  Chest CT in 08/2016 ?nodular liver. Prior CT abd and MRI 2017 without findings of cirrhosis. U/S 01/2017 with fatty liver and could not exclude cirrhosis but no splenomegaly.   Weight down since 2009, 282 then. 05/2016 216 lb down to 197 today.    Current Outpatient Prescriptions  Medication Sig Dispense Refill  . amLODipine (NORVASC) 5 MG tablet Take 5 mg by mouth daily.    Marland Kitchen aspirin 81 MG tablet Take 81 mg by mouth daily.     . carvedilol (COREG) 12.5 MG tablet Take 12.5 mg by mouth 2 (two) times daily with a meal.      . fenofibrate (TRICOR) 145 MG tablet Take 72.5 mg by mouth daily. Takes 1/2 tablet daily    . lisinopril (PRINIVIL,ZESTRIL) 40 MG tablet Take 40 mg by mouth daily.    . Multiple Vitamins-Minerals (ONE-A-DAY MENS 50+ ADVANTAGE) TABS Take 1 tablet by mouth daily.    . polyethylene glycol powder (GLYCOLAX/MIRALAX) powder Take 1 capful (17 g) every evening on days that you do not have a good soft bowel movement. (Patient taking differently: Take 1 Container by mouth daily as needed for mild constipation. Take 1 capful (17 g) every evening on  days that you do not have a good soft bowel movement.) 527 g 3  . simvastatin (ZOCOR) 20 MG tablet Take 20 mg by mouth at bedtime.      No current facility-administered medications for this visit.     Allergies as of 04/12/2017 - Review Complete 04/12/2017  Allergen Reaction Noted  . Iohexol Hives 05/21/2007    ROS:  General: Negative for anorexia, weight loss, fever, chills, fatigue, weakness. ENT: Negative for hoarseness, difficulty swallowing , nasal congestion. CV: Negative for chest pain, angina, palpitations, dyspnea on exertion, peripheral edema.  Respiratory: Negative for dyspnea at rest, dyspnea on exertion, cough, sputum, wheezing.  GI: See history of present illness. GU:  Negative for dysuria, hematuria, urinary incontinence, urinary frequency, nocturnal urination.  Endo: Negative for unusual weight change.    Physical Examination:   BP 136/76   Pulse 66   Temp (!) 97.1 F (36.2 C) (Oral)   Ht 6\' 2"  (1.88 m)   Wt 197 lb 12.8 oz (89.7 kg)   BMI 25.40 kg/m   General: Well-nourished, well-developed in no acute distress.  Eyes: No icterus. Mouth: Oropharyngeal mucosa moist and pink , no lesions erythema or exudate. Lungs: Clear to auscultation bilaterally.  Heart: Regular rate and rhythm, no murmurs rubs or gallops.  Abdomen: Bowel sounds are normal, nontender, nondistended, no hepatosplenomegaly or masses, no abdominal bruits or hernia , no rebound or guarding.  Extremities: No lower extremity edema. No clubbing or deformities. Neuro: Alert and oriented x 4   Skin: Warm and dry, no jaundice.   Psych: Alert and cooperative, normal mood and affect.  Labs:  Lab Results  Component Value Date   CREATININE 1.28 (H) 09/06/2016   BUN 21 (H) 09/06/2016   NA 141 09/06/2016   K 4.0 09/06/2016   CL 107 09/06/2016   CO2 26 09/06/2016   Lab Results  Component Value Date   WBC 4.3 03/20/2017   HGB 13.2 03/20/2017   HCT 39.1 03/20/2017   MCV 92.2 03/20/2017   PLT  250 03/20/2017   This SmartLink has not been configured with any valid records.    Lab Results  Component Value Date   IRON 125 03/20/2017   TIBC 336 03/20/2017   Lab Results  Component Value Date   ALT 26 05/28/2011   AST 28 05/28/2011   ALKPHOS 52 05/28/2011   BILITOT 1.0 05/28/2011    Imaging Studies: No results found.

## 2017-04-12 NOTE — Assessment & Plan Note (Signed)
Persistent weight loss with anorexia but otherwise asymptomatic. Extensive evaluation as outlines. Appendectomy scheduled. Will follow up on path as available. Suspicious of inadequate caloric intake but patient has declined nutrition evaluation.   Not convinced of advanced liver disease but will check u/s in six months.

## 2017-04-12 NOTE — Patient Instructions (Signed)
1. Further recommendations pending your upcoming surgery. We will be in touch. Good luck!

## 2017-04-15 NOTE — Progress Notes (Signed)
CC'D TO PCP °

## 2017-04-16 NOTE — Patient Instructions (Signed)
PLACIDO HANGARTNER  04/16/2017   Your procedure is scheduled on: 04/26/2017    Report to [facility] Main  Entrance Take [place]  elevators to 3rd floor to  [place] at    0730 AM.    Call this number if you have problems the morning of surgery [phone]    Remember: ONLY 1 PERSON MAY GO WITH YOU TO SHORT STAY TO GET  READY MORNING OF [place].  Do not eat food or drink liquids :After Midnight.     Take these medicines the morning of surgery with A SIP OF WATER: Amlodipine ( NORvasc), Carvedilol ( Coreg) DO NOT TAKE ANY DIABETIC MEDICATIONS DAY OF YOUR SURGERY                               You may not have any metal on your body including hair pins and              piercings  Do not wear jewelry, , lotions, powders or perfumes, deodorant                        Men may shave face and neck.   Do not bring valuables to the hospital. [place].  Contacts, dentures or bridgework may not be worn into surgery.  Leave suitcase in the car. After surgery it may be brought to your room.                       Please read over the following fact sheets you were given: _____________________________________________________________________             [facility] - Preparing for Surgery Before surgery, you can play an important role.  Because skin is not sterile, your skin needs to be as free of germs as possible.  You can reduce the number of germs on your skin by washing with CHG (chlorahexidine gluconate) soap before surgery.  CHG is an antiseptic cleaner which kills germs and bonds with the skin to continue killing germs even after washing. Please DO NOT use if you have an allergy to CHG or antibacterial soaps.  If your skin becomes reddened/irritated stop using the CHG and inform your nurse when you arrive at Short Stay. Do not shave (including legs and underarms) for at least 48 hours prior to the first  CHG shower.  You may shave your face/neck. Please follow these instructions carefully:  1.  Shower with CHG Soap the night before surgery and the  morning of Surgery.  2.  If you choose to wash your hair, wash your hair first as usual with your  normal  shampoo.  3.  After you shampoo, rinse your hair and body thoroughly to remove the  shampoo.                           4.  Use CHG as you would any other liquid soap.  You can apply chg directly  to the skin and wash                       Gently with a scrungie  or clean washcloth.  5.  Apply the CHG Soap to your body ONLY FROM THE NECK DOWN.   Do not use on face/ open                           Wound or open sores. Avoid contact with eyes, ears mouth and genitals (private parts).                       Wash face,  Genitals (private parts) with your normal soap.             6.  Wash thoroughly, paying special attention to the area where your surgery  will be performed.  7.  Thoroughly rinse your body with warm water from the neck down.  8.  DO NOT shower/wash with your normal soap after using and rinsing off  the CHG Soap.                9.  Pat yourself dry with a clean towel.            10.  Wear clean pajamas.            11.  Place clean sheets on your bed the night of your first shower and do not  sleep with pets. Day of Surgery : Do not apply any lotions/deodorants the morning of surgery.  Please wear clean clothes to the hospital/surgery center.  FAILURE TO FOLLOW THESE INSTRUCTIONS MAY RESULT IN THE CANCELLATION OF YOUR SURGERY PATIENT SIGNATURE_________________________________  NURSE SIGNATURE__________________________________  ________________________________________________________________________

## 2017-04-19 ENCOUNTER — Encounter (HOSPITAL_COMMUNITY)
Admission: RE | Admit: 2017-04-19 | Discharge: 2017-04-19 | Disposition: A | Discharge status: Home / Self Care | Payer: Medicare Other | Source: Ambulatory Visit | Attending: Surgery | Admitting: Surgery

## 2017-04-19 ENCOUNTER — Encounter (HOSPITAL_COMMUNITY): Payer: Self-pay

## 2017-04-19 DIAGNOSIS — I10 Essential (primary) hypertension: Secondary | ICD-10-CM | POA: Diagnosis not present

## 2017-04-19 DIAGNOSIS — M109 Gout, unspecified: Secondary | ICD-10-CM | POA: Diagnosis not present

## 2017-04-19 DIAGNOSIS — I712 Thoracic aortic aneurysm, without rupture: Secondary | ICD-10-CM | POA: Insufficient documentation

## 2017-04-19 DIAGNOSIS — E785 Hyperlipidemia, unspecified: Secondary | ICD-10-CM | POA: Insufficient documentation

## 2017-04-19 DIAGNOSIS — D121 Benign neoplasm of appendix: Secondary | ICD-10-CM | POA: Insufficient documentation

## 2017-04-19 DIAGNOSIS — Z85038 Personal history of other malignant neoplasm of large intestine: Secondary | ICD-10-CM | POA: Diagnosis not present

## 2017-04-19 DIAGNOSIS — Z01812 Encounter for preprocedural laboratory examination: Secondary | ICD-10-CM | POA: Diagnosis not present

## 2017-04-19 DIAGNOSIS — E119 Type 2 diabetes mellitus without complications: Secondary | ICD-10-CM | POA: Diagnosis not present

## 2017-04-19 LAB — CBC
HCT: 36.7 % — ABNORMAL LOW (ref 39.0–52.0)
HEMOGLOBIN: 12.8 g/dL — AB (ref 13.0–17.0)
MCH: 32.4 pg (ref 26.0–34.0)
MCHC: 34.9 g/dL (ref 30.0–36.0)
MCV: 92.9 fL (ref 78.0–100.0)
Platelets: 234 10*3/uL (ref 150–400)
RBC: 3.95 MIL/uL — AB (ref 4.22–5.81)
RDW: 13.5 % (ref 11.5–15.5)
WBC: 4.2 10*3/uL (ref 4.0–10.5)

## 2017-04-19 LAB — HEMOGLOBIN A1C
HEMOGLOBIN A1C: 5.2 % (ref 4.8–5.6)
Mean Plasma Glucose: 102.54 mg/dL

## 2017-04-19 LAB — BASIC METABOLIC PANEL
ANION GAP: 8 (ref 5–15)
BUN: 22 mg/dL — ABNORMAL HIGH (ref 6–20)
CALCIUM: 9.8 mg/dL (ref 8.9–10.3)
CO2: 27 mmol/L (ref 22–32)
Chloride: 105 mmol/L (ref 101–111)
Creatinine, Ser: 1.23 mg/dL (ref 0.61–1.24)
GFR, EST NON AFRICAN AMERICAN: 54 mL/min — AB (ref >60)
Glucose, Bld: 114 mg/dL — ABNORMAL HIGH (ref 65–99)
Potassium: 4.9 mmol/L (ref 3.5–5.1)
SODIUM: 140 mmol/L (ref 135–145)

## 2017-04-19 LAB — GLUCOSE, CAPILLARY: GLUCOSE-CAPILLARY: 113 mg/dL — AB (ref 65–99)

## 2017-04-19 NOTE — Progress Notes (Addendum)
EKG-09/06/16-epic  Dr Domenic Polite- preop exam- 02/14/17-epic  Stress Test- epic -02/19/17 ECHO-epic 03/04/17 CT chest- 08/26/16-epic

## 2017-04-19 NOTE — Progress Notes (Signed)
BMP done 04/19/17 routed via epic to Dr Johney Maine.

## 2017-04-26 ENCOUNTER — Observation Stay (HOSPITAL_COMMUNITY)
Admission: RE | Admit: 2017-04-26 | Discharge: 2017-04-27 | Disposition: A | Discharge status: Home / Self Care | Payer: Medicare Other | Source: Ambulatory Visit | Attending: Surgery | Admitting: Surgery

## 2017-04-26 ENCOUNTER — Encounter (HOSPITAL_COMMUNITY): Payer: Self-pay

## 2017-04-26 ENCOUNTER — Ambulatory Visit (HOSPITAL_COMMUNITY): Payer: Medicare Other | Admitting: Certified Registered Nurse Anesthetist

## 2017-04-26 ENCOUNTER — Encounter (HOSPITAL_COMMUNITY)
Admission: RE | Disposition: A | Discharge status: Home / Self Care | Payer: Self-pay | Source: Ambulatory Visit | Attending: Surgery

## 2017-04-26 DIAGNOSIS — Z888 Allergy status to other drugs, medicaments and biological substances status: Secondary | ICD-10-CM | POA: Diagnosis not present

## 2017-04-26 DIAGNOSIS — E781 Pure hyperglyceridemia: Secondary | ICD-10-CM | POA: Diagnosis not present

## 2017-04-26 DIAGNOSIS — Z860101 Personal history of adenomatous and serrated colon polyps: Secondary | ICD-10-CM

## 2017-04-26 DIAGNOSIS — E7800 Pure hypercholesterolemia, unspecified: Secondary | ICD-10-CM | POA: Diagnosis not present

## 2017-04-26 DIAGNOSIS — Z85038 Personal history of other malignant neoplasm of large intestine: Secondary | ICD-10-CM | POA: Diagnosis not present

## 2017-04-26 DIAGNOSIS — D121 Benign neoplasm of appendix: Principal | ICD-10-CM | POA: Insufficient documentation

## 2017-04-26 DIAGNOSIS — E119 Type 2 diabetes mellitus without complications: Secondary | ICD-10-CM

## 2017-04-26 DIAGNOSIS — Z87891 Personal history of nicotine dependence: Secondary | ICD-10-CM | POA: Diagnosis not present

## 2017-04-26 DIAGNOSIS — E1151 Type 2 diabetes mellitus with diabetic peripheral angiopathy without gangrene: Secondary | ICD-10-CM | POA: Diagnosis not present

## 2017-04-26 DIAGNOSIS — K5900 Constipation, unspecified: Secondary | ICD-10-CM | POA: Diagnosis present

## 2017-04-26 DIAGNOSIS — Z794 Long term (current) use of insulin: Secondary | ICD-10-CM | POA: Insufficient documentation

## 2017-04-26 DIAGNOSIS — Z79899 Other long term (current) drug therapy: Secondary | ICD-10-CM | POA: Diagnosis not present

## 2017-04-26 DIAGNOSIS — I712 Thoracic aortic aneurysm, without rupture: Secondary | ICD-10-CM | POA: Diagnosis not present

## 2017-04-26 DIAGNOSIS — D120 Benign neoplasm of cecum: Secondary | ICD-10-CM | POA: Diagnosis not present

## 2017-04-26 DIAGNOSIS — M109 Gout, unspecified: Secondary | ICD-10-CM | POA: Diagnosis present

## 2017-04-26 DIAGNOSIS — Z86010 Personal history of colonic polyps: Secondary | ICD-10-CM | POA: Diagnosis not present

## 2017-04-26 DIAGNOSIS — K635 Polyp of colon: Secondary | ICD-10-CM | POA: Diagnosis present

## 2017-04-26 DIAGNOSIS — Z9221 Personal history of antineoplastic chemotherapy: Secondary | ICD-10-CM | POA: Diagnosis not present

## 2017-04-26 DIAGNOSIS — Z7982 Long term (current) use of aspirin: Secondary | ICD-10-CM | POA: Insufficient documentation

## 2017-04-26 DIAGNOSIS — K660 Peritoneal adhesions (postprocedural) (postinfection): Secondary | ICD-10-CM | POA: Diagnosis not present

## 2017-04-26 DIAGNOSIS — I10 Essential (primary) hypertension: Secondary | ICD-10-CM | POA: Diagnosis present

## 2017-04-26 DIAGNOSIS — E669 Obesity, unspecified: Secondary | ICD-10-CM | POA: Diagnosis present

## 2017-04-26 HISTORY — PX: LAPAROSCOPIC APPENDECTOMY: SHX408

## 2017-04-26 HISTORY — PX: LYSIS OF ADHESION: SHX5961

## 2017-04-26 LAB — GLUCOSE, CAPILLARY: Glucose-Capillary: 115 mg/dL — ABNORMAL HIGH (ref 65–99)

## 2017-04-26 SURGERY — APPENDECTOMY, LAPAROSCOPIC
Anesthesia: General

## 2017-04-26 MED ORDER — HYDROCORTISONE 1 % EX CREA: 1.0000 "application " | TOPICAL_CREAM | Freq: Three times a day (TID) | CUTANEOUS | Status: DC | PRN

## 2017-04-26 MED ORDER — BUPIVACAINE-EPINEPHRINE 0.25% -1:200000 IJ SOLN
INTRAMUSCULAR | Status: AC
  Filled 2017-04-26: qty 1

## 2017-04-26 MED ORDER — ENSURE SURGERY PO LIQD
237.0000 mL | Freq: Two times a day (BID) | ORAL | Status: DC
  Filled 2017-04-26 (×3): qty 237

## 2017-04-26 MED ORDER — SUGAMMADEX SODIUM 200 MG/2ML IV SOLN
INTRAVENOUS | Status: DC | PRN
  Administered 2017-04-26: 200 mg via INTRAVENOUS

## 2017-04-26 MED ORDER — SODIUM CHLORIDE 0.9% FLUSH: 3.0000 mL | INTRAVENOUS | Status: DC | PRN

## 2017-04-26 MED ORDER — SODIUM CHLORIDE 0.9% FLUSH: 3.0000 mL | Freq: Two times a day (BID) | INTRAVENOUS | Status: DC

## 2017-04-26 MED ORDER — FENTANYL CITRATE (PF) 100 MCG/2ML IJ SOLN: 25.0000 ug | INTRAMUSCULAR | Status: DC | PRN

## 2017-04-26 MED ORDER — CARVEDILOL 12.5 MG PO TABS
12.5000 mg | ORAL_TABLET | Freq: Two times a day (BID) | ORAL | Status: DC
  Administered 2017-04-26 – 2017-04-27 (×2): 12.5 mg via ORAL
  Filled 2017-04-26 (×2): qty 1

## 2017-04-26 MED ORDER — FENTANYL CITRATE (PF) 100 MCG/2ML IJ SOLN
INTRAMUSCULAR | Status: AC
  Filled 2017-04-26: qty 2

## 2017-04-26 MED ORDER — FENTANYL CITRATE (PF) 100 MCG/2ML IJ SOLN
25.0000 ug | INTRAMUSCULAR | Status: DC | PRN
  Administered 2017-04-26 (×4): 50 ug via INTRAVENOUS

## 2017-04-26 MED ORDER — SODIUM CHLORIDE 0.9 % IV SOLN: 250.0000 mL | INTRAVENOUS | Status: DC | PRN

## 2017-04-26 MED ORDER — MAGIC MOUTHWASH
15.0000 mL | Freq: Four times a day (QID) | ORAL | Status: DC | PRN
  Filled 2017-04-26: qty 15

## 2017-04-26 MED ORDER — ONDANSETRON HCL 4 MG/2ML IJ SOLN: 4.0000 mg | Freq: Four times a day (QID) | INTRAMUSCULAR | Status: DC | PRN

## 2017-04-26 MED ORDER — CEFOTETAN DISODIUM-DEXTROSE 2-2.08 GM-% IV SOLR
INTRAVENOUS | Status: AC
  Filled 2017-04-26: qty 50

## 2017-04-26 MED ORDER — ENOXAPARIN SODIUM 40 MG/0.4ML ~~LOC~~ SOLN
40.0000 mg | SUBCUTANEOUS | Status: DC
  Administered 2017-04-27: 40 mg via SUBCUTANEOUS
  Filled 2017-04-26: qty 0.4

## 2017-04-26 MED ORDER — BISACODYL 10 MG RE SUPP: 10.0000 mg | Freq: Every day | RECTAL | Status: DC | PRN

## 2017-04-26 MED ORDER — CEFOTETAN DISODIUM-DEXTROSE 2-2.08 GM-% IV SOLR
2.0000 g | INTRAVENOUS | Status: AC
Start: 2017-04-26 — End: 2017-04-26
  Administered 2017-04-26: 2 g via INTRAVENOUS

## 2017-04-26 MED ORDER — DEXAMETHASONE SODIUM PHOSPHATE 10 MG/ML IJ SOLN
INTRAMUSCULAR | Status: AC
  Filled 2017-04-26: qty 1

## 2017-04-26 MED ORDER — LIDOCAINE 2% (20 MG/ML) 5 ML SYRINGE
INTRAMUSCULAR | Status: DC | PRN
  Administered 2017-04-26: 80 mg via INTRAVENOUS

## 2017-04-26 MED ORDER — PROPOFOL 10 MG/ML IV BOLUS
INTRAVENOUS | Status: AC
  Filled 2017-04-26: qty 20

## 2017-04-26 MED ORDER — LIP MEDEX EX OINT
1.0000 "application " | TOPICAL_OINTMENT | Freq: Two times a day (BID) | CUTANEOUS | Status: DC
  Administered 2017-04-26 – 2017-04-27 (×2): 1 via TOPICAL
  Filled 2017-04-26: qty 7

## 2017-04-26 MED ORDER — MENTHOL 3 MG MT LOZG: 1.0000 | LOZENGE | OROMUCOSAL | Status: DC | PRN

## 2017-04-26 MED ORDER — PHENOL 1.4 % MT LIQD: 1.0000 | OROMUCOSAL | Status: DC | PRN

## 2017-04-26 MED ORDER — CEFOTETAN DISODIUM 2 G IJ SOLR
2.0000 g | Freq: Two times a day (BID) | INTRAMUSCULAR | Status: AC
  Administered 2017-04-26: 2 g via INTRAVENOUS
  Filled 2017-04-26: qty 2

## 2017-04-26 MED ORDER — DIPHENHYDRAMINE HCL 50 MG/ML IJ SOLN: 12.5000 mg | Freq: Four times a day (QID) | INTRAMUSCULAR | Status: DC | PRN

## 2017-04-26 MED ORDER — SIMVASTATIN 20 MG PO TABS
20.0000 mg | ORAL_TABLET | Freq: Every day | ORAL | Status: DC
  Administered 2017-04-26: 20 mg via ORAL
  Filled 2017-04-26: qty 1

## 2017-04-26 MED ORDER — STERILE WATER FOR IRRIGATION IR SOLN
Status: DC | PRN
  Administered 2017-04-26: 1000 mL

## 2017-04-26 MED ORDER — PROCHLORPERAZINE EDISYLATE 5 MG/ML IJ SOLN: 5.0000 mg | INTRAMUSCULAR | Status: DC | PRN

## 2017-04-26 MED ORDER — DEXAMETHASONE SODIUM PHOSPHATE 4 MG/ML IJ SOLN
INTRAMUSCULAR | Status: DC | PRN
  Administered 2017-04-26: 4 mg via INTRAVENOUS

## 2017-04-26 MED ORDER — SODIUM CHLORIDE 0.9 % IV SOLN
INTRAVENOUS | Status: DC
  Administered 2017-04-26: 16:00:00 via INTRAVENOUS

## 2017-04-26 MED ORDER — ENALAPRILAT 1.25 MG/ML IV SOLN
0.6250 mg | Freq: Four times a day (QID) | INTRAVENOUS | Status: DC | PRN
  Filled 2017-04-26: qty 1

## 2017-04-26 MED ORDER — 0.9 % SODIUM CHLORIDE (POUR BTL) OPTIME
TOPICAL | Status: DC | PRN
  Administered 2017-04-26: 1000 mL

## 2017-04-26 MED ORDER — SUGAMMADEX SODIUM 200 MG/2ML IV SOLN
INTRAVENOUS | Status: AC
  Filled 2017-04-26: qty 2

## 2017-04-26 MED ORDER — ALUM & MAG HYDROXIDE-SIMETH 200-200-20 MG/5ML PO SUSP: 30.0000 mL | Freq: Four times a day (QID) | ORAL | Status: DC | PRN

## 2017-04-26 MED ORDER — FENOFIBRATE 54 MG PO TABS
54.0000 mg | ORAL_TABLET | Freq: Every day | ORAL | Status: DC
  Administered 2017-04-27: 54 mg via ORAL
  Filled 2017-04-26: qty 1

## 2017-04-26 MED ORDER — EPHEDRINE 5 MG/ML INJ
INTRAVENOUS | Status: AC
  Filled 2017-04-26: qty 10

## 2017-04-26 MED ORDER — HYDROCORTISONE 2.5 % RE CREA: 1.0000 "application " | TOPICAL_CREAM | Freq: Four times a day (QID) | RECTAL | Status: DC | PRN

## 2017-04-26 MED ORDER — LACTATED RINGERS IV BOLUS (SEPSIS): 1000.0000 mL | Freq: Three times a day (TID) | INTRAVENOUS | Status: DC | PRN

## 2017-04-26 MED ORDER — LACTATED RINGERS IV SOLN
INTRAVENOUS | Status: DC
  Administered 2017-04-26 (×3): via INTRAVENOUS

## 2017-04-26 MED ORDER — LACTATED RINGERS IR SOLN
Status: DC | PRN
  Administered 2017-04-26: 1000 mL

## 2017-04-26 MED ORDER — TRAMADOL HCL 50 MG PO TABS: 50.0000 mg | ORAL_TABLET | Freq: Four times a day (QID) | ORAL | 0 refills | Status: DC | PRN

## 2017-04-26 MED ORDER — LISINOPRIL 20 MG PO TABS
40.0000 mg | ORAL_TABLET | Freq: Every day | ORAL | Status: DC
  Administered 2017-04-27: 40 mg via ORAL
  Filled 2017-04-26: qty 2

## 2017-04-26 MED ORDER — ACETAMINOPHEN 500 MG PO TABS
1000.0000 mg | ORAL_TABLET | Freq: Three times a day (TID) | ORAL | Status: DC
  Administered 2017-04-26 – 2017-04-27 (×2): 1000 mg via ORAL
  Filled 2017-04-26 (×2): qty 2

## 2017-04-26 MED ORDER — ASPIRIN EC 81 MG PO TBEC
81.0000 mg | DELAYED_RELEASE_TABLET | Freq: Every day | ORAL | Status: DC
  Administered 2017-04-27: 81 mg via ORAL
  Filled 2017-04-26: qty 1

## 2017-04-26 MED ORDER — ROCURONIUM BROMIDE 50 MG/5ML IV SOSY
PREFILLED_SYRINGE | INTRAVENOUS | Status: AC
  Filled 2017-04-26: qty 5

## 2017-04-26 MED ORDER — ONDANSETRON HCL 4 MG/2ML IJ SOLN
4.0000 mg | Freq: Once | INTRAMUSCULAR | Status: AC | PRN
  Administered 2017-04-26: 4 mg via INTRAVENOUS

## 2017-04-26 MED ORDER — LIDOCAINE 2% (20 MG/ML) 5 ML SYRINGE
INTRAMUSCULAR | Status: AC
  Filled 2017-04-26: qty 5

## 2017-04-26 MED ORDER — LACTATED RINGERS IV SOLN: 1000.0000 mL | Freq: Three times a day (TID) | INTRAVENOUS | Status: DC | PRN

## 2017-04-26 MED ORDER — TRAMADOL HCL 50 MG PO TABS: 50.0000 mg | ORAL_TABLET | Freq: Four times a day (QID) | ORAL | Status: DC | PRN

## 2017-04-26 MED ORDER — ROCURONIUM BROMIDE 10 MG/ML (PF) SYRINGE
PREFILLED_SYRINGE | INTRAVENOUS | Status: DC | PRN
Start: 2017-04-26 — End: 2017-04-26
  Administered 2017-04-26: 40 mg via INTRAVENOUS
  Administered 2017-04-26 (×2): 10 mg via INTRAVENOUS
  Administered 2017-04-26: 5 mg via INTRAVENOUS

## 2017-04-26 MED ORDER — HYDRALAZINE HCL 20 MG/ML IJ SOLN: 5.0000 mg | INTRAMUSCULAR | Status: DC | PRN

## 2017-04-26 MED ORDER — DIPHENHYDRAMINE HCL 12.5 MG/5ML PO ELIX: 12.5000 mg | ORAL_SOLUTION | Freq: Four times a day (QID) | ORAL | Status: DC | PRN

## 2017-04-26 MED ORDER — BUPIVACAINE-EPINEPHRINE (PF) 0.25% -1:200000 IJ SOLN
INTRAMUSCULAR | Status: AC
  Filled 2017-04-26: qty 30

## 2017-04-26 MED ORDER — BUPIVACAINE-EPINEPHRINE 0.25% -1:200000 IJ SOLN
INTRAMUSCULAR | Status: DC | PRN
  Administered 2017-04-26: 50 mL

## 2017-04-26 MED ORDER — POLYETHYLENE GLYCOL 3350 17 G PO PACK: 17.0000 g | PACK | Freq: Every day | ORAL | Status: DC | PRN

## 2017-04-26 MED ORDER — AMLODIPINE BESYLATE 5 MG PO TABS
5.0000 mg | ORAL_TABLET | ORAL | Status: DC
  Administered 2017-04-26: 5 mg via ORAL
  Filled 2017-04-26: qty 1

## 2017-04-26 MED ORDER — SIMETHICONE 80 MG PO CHEW: 40.0000 mg | CHEWABLE_TABLET | Freq: Four times a day (QID) | ORAL | Status: DC | PRN

## 2017-04-26 MED ORDER — ONDANSETRON 4 MG PO TBDP: 4.0000 mg | ORAL_TABLET | Freq: Four times a day (QID) | ORAL | Status: DC | PRN

## 2017-04-26 MED ORDER — METHOCARBAMOL 500 MG PO TABS: 500.0000 mg | ORAL_TABLET | Freq: Four times a day (QID) | ORAL | Status: DC | PRN

## 2017-04-26 MED ORDER — PROPOFOL 10 MG/ML IV BOLUS
INTRAVENOUS | Status: DC | PRN
  Administered 2017-04-26: 100 mg via INTRAVENOUS

## 2017-04-26 MED ORDER — EPHEDRINE SULFATE-NACL 50-0.9 MG/10ML-% IV SOSY
PREFILLED_SYRINGE | INTRAVENOUS | Status: DC | PRN
  Administered 2017-04-26: 10 mg via INTRAVENOUS
  Administered 2017-04-26: 5 mg via INTRAVENOUS
  Administered 2017-04-26: 10 mg via INTRAVENOUS
  Administered 2017-04-26: 5 mg via INTRAVENOUS

## 2017-04-26 MED ORDER — GUAIFENESIN-DM 100-10 MG/5ML PO SYRP: 10.0000 mL | ORAL_SOLUTION | ORAL | Status: DC | PRN

## 2017-04-26 MED ORDER — ONDANSETRON HCL 4 MG/2ML IJ SOLN
INTRAMUSCULAR | Status: AC
  Filled 2017-04-26: qty 2

## 2017-04-26 MED ORDER — ADULT MULTIVITAMIN W/MINERALS CH
1.0000 | ORAL_TABLET | Freq: Every day | ORAL | Status: DC
  Filled 2017-04-26: qty 1

## 2017-04-26 SURGICAL SUPPLY — 54 items
APPLIER CLIP 5 13 M/L LIGAMAX5 (MISCELLANEOUS)
APPLIER CLIP ROT 10 11.4 M/L (STAPLE)
APR CLP MED LRG 11.4X10 (STAPLE)
APR CLP MED LRG 5 ANG JAW (MISCELLANEOUS)
BAG SPEC RTRVL 10 TROC 200 (ENDOMECHANICALS) ×1
BAG SPEC RTRVL LRG 6X4 10 (ENDOMECHANICALS) ×1
CABLE HIGH FREQUENCY MONO STRZ (ELECTRODE) ×3 IMPLANT
CHLORAPREP W/TINT 26ML (MISCELLANEOUS) ×3 IMPLANT
CLIP APPLIE 5 13 M/L LIGAMAX5 (MISCELLANEOUS) IMPLANT
CLIP APPLIE ROT 10 11.4 M/L (STAPLE) IMPLANT
COVER SURGICAL LIGHT HANDLE (MISCELLANEOUS) ×3 IMPLANT
CUTTER FLEX LINEAR 45M (STAPLE) IMPLANT
DECANTER SPIKE VIAL GLASS SM (MISCELLANEOUS) ×3 IMPLANT
DEVICE TROCAR PUNCTURE CLOSURE (ENDOMECHANICALS) IMPLANT
DRAPE LAPAROSCOPIC ABDOMINAL (DRAPES) ×3 IMPLANT
DRAPE WARM FLUID 44X44 (DRAPE) ×3 IMPLANT
DRSG TEGADERM 2-3/8X2-3/4 SM (GAUZE/BANDAGES/DRESSINGS) ×4 IMPLANT
DRSG TEGADERM 4X4.75 (GAUZE/BANDAGES/DRESSINGS) ×3 IMPLANT
ELECT REM PT RETURN 15FT ADLT (MISCELLANEOUS) ×3 IMPLANT
ENDOLOOP SUT PDS II  0 18 (SUTURE)
ENDOLOOP SUT PDS II 0 18 (SUTURE) IMPLANT
GAUZE SPONGE 2X2 8PLY STRL LF (GAUZE/BANDAGES/DRESSINGS) ×1 IMPLANT
GLOVE ECLIPSE 8.0 STRL XLNG CF (GLOVE) ×3 IMPLANT
GLOVE INDICATOR 8.0 STRL GRN (GLOVE) ×3 IMPLANT
GOWN STRL REUS W/TWL XL LVL3 (GOWN DISPOSABLE) ×6 IMPLANT
IRRIG SUCT STRYKERFLOW 2 WTIP (MISCELLANEOUS) ×3
IRRIGATION SUCT STRKRFLW 2 WTP (MISCELLANEOUS) IMPLANT
KIT BASIN OR (CUSTOM PROCEDURE TRAY) ×3 IMPLANT
PAD POSITIONING PINK XL (MISCELLANEOUS) ×3 IMPLANT
POUCH RETRIEVAL ECOSAC 10 (ENDOMECHANICALS) IMPLANT
POUCH RETRIEVAL ECOSAC 10MM (ENDOMECHANICALS) ×2
POUCH SPECIMEN RETRIEVAL 10MM (ENDOMECHANICALS) ×3 IMPLANT
RELOAD 45 VASCULAR/THIN (ENDOMECHANICALS) IMPLANT
RELOAD STAPLE 45 2.5 WHT GRN (ENDOMECHANICALS) IMPLANT
RELOAD STAPLE 45 3.5 BLU ETS (ENDOMECHANICALS) IMPLANT
RELOAD STAPLE 60 3.6 BLU REG (STAPLE) IMPLANT
RELOAD STAPLE TA45 3.5 REG BLU (ENDOMECHANICALS) IMPLANT
RELOAD STAPLER BLUE 60MM (STAPLE) ×2 IMPLANT
SCISSORS LAP 5X35 DISP (ENDOMECHANICALS) ×3 IMPLANT
SHEARS HARMONIC ACE PLUS 36CM (ENDOMECHANICALS) ×2 IMPLANT
SLEEVE XCEL OPT CAN 5 100 (ENDOMECHANICALS) ×7 IMPLANT
SPONGE GAUZE 2X2 STER 10/PKG (GAUZE/BANDAGES/DRESSINGS) ×2
STAPLER ECHELON FLEX (STAPLE) ×2 IMPLANT
STAPLER RELOAD BLUE 60MM (STAPLE) ×6
SUT MNCRL AB 4-0 PS2 18 (SUTURE) ×3 IMPLANT
SUT PDS AB 0 CT1 36 (SUTURE) IMPLANT
SUT PDS AB 1 CT1 27 (SUTURE) ×4 IMPLANT
SUT SILK 2 0 SH (SUTURE) IMPLANT
TOWEL OR 17X26 10 PK STRL BLUE (TOWEL DISPOSABLE) ×3 IMPLANT
TRAY FOLEY W/METER SILVER 16FR (SET/KITS/TRAYS/PACK) IMPLANT
TRAY LAPAROSCOPIC (CUSTOM PROCEDURE TRAY) ×3 IMPLANT
TROCAR BLADELESS OPT 5 100 (ENDOMECHANICALS) ×3 IMPLANT
TROCAR XCEL 12X100 BLDLESS (ENDOMECHANICALS) ×3 IMPLANT
TUBING INSUF HEATED (TUBING) ×3 IMPLANT

## 2017-04-26 NOTE — H&P (Signed)
LAVELLE AKEL 01/21/2017 3:33 PM Location: [facility] Patient #: [id number] DOB: 1935-04-13 Single / Language: Cleophus Molt / Race: White Male  Patient Care Team: Celene Squibb, MD as PCP - General (Internal Medicine) Gala Romney Cristopher Estimable, MD as Consulting Physician (Gastroenterology) Michael Boston, MD as Consulting Physician (General Surgery) Satira Sark, MD as Consulting Physician (Cardiology)   History of Present Illness The patient is a 82 year old male who presents with a colonic polyp. Note for "Colonic polyp": ` ` ` Patient sent for surgical consultation at the request of Dr. Gala Romney  Chief Complaint: Polyp at appendiceal oriface  The patient is a Pleasant 82 year old male with a history of colon cancer and polyps. Had complete colonic obstruction that required urgent open colectomy and end colostomy by Dr. Lucia Gaskins in 2002. Underwent follow-up endoscopies for many years. Had colostomy takedown 2003. Had suture granuloma removed. Had incisional hernia repaired 2006. Had colonoscopy 2011 that had a few polyps removed. Transition to Dr. Gala Romney with gastroenterology for screening colonoscopy. Polyps removed. Had high-grade dysplasia at ascending colon location. Had 6 month follow-up. Had polyp at the appendiceal orifice. Tubullovillous adenoma. Not able to be completely excised. Surgical consultation requested for possible appendectomy. Dr. Lucia Gaskins requested. He was booked out a month. The patient wished to have first available. I was available today for the patient's convenience.  Patient moves his bowels every morning. Hepatitis been fair but he has been eating to keep his nutritional. He has intentionally lost some weight. More stable now. Diabetic control with hyperglycemic meds. Recently tapered off all medications denies any history of heart attacks or stroke. Has not smoked in almost 40 years. However he gets tired and short of breath walking 5 minutes. No  personal nor family history of inflammatory bowel disease, irritable bowel syndrome, allergy such as Celiac Sprue, dietary/dairy problems, colitis, ulcers nor gastritis. No recent sick contacts/gastroenteritis. No travel outside the country. No changes in diet. No dysphagia to solids or liquids. No significant heartburn or reflux. No hematochezia, hematemesis, coffee ground emesis. No evidence of prior gastric/peptic ulceration.  Patient received cardiac clearance: "Notes recorded by Satira Sark, MD on 03/04/2017 at 12:16 PM EDT Results reviewed. Echocardiogram is reassuring with normal LVEF at 55-60% and no regional wall motion abnormalities to suggest prior inferior infarct. In light of this and his recent Myoview, he should be able to proceed with planned laparoscopic appendectomy at low to intermediate perioperative cardiac risk. Copy to surgeon. A copy of this test should be forwarded to Celene Squibb, MD."   (Review of systems as stated in this history (HPI) or in the review of systems. Otherwise all other 12 point ROS are negative)   Past Surgical History (Tanisha A. Owens Shark, [place]; 01/21/2017 3:33 PM) Cataract Surgery  Bilateral. Colon Polyp Removal - Colonoscopy  Colon Removal - Partial   Diagnostic Studies History (Tanisha A. Owens Shark, [place]; 01/21/2017 3:33 PM) Colonoscopy  within last year  Allergies (Tanisha A. Owens Shark, [place]; 01/21/2017 3:34 PM) No Known Drug Allergies 01/21/2017 Allergies Reconciled   Medication History (Tanisha A. Owens Shark, [place]; 01/21/2017 3:36 PM) Accu-Chek Aviva Plus (In Vitro) Active. AmLODIPine Besylate (5MG  Tablet, Oral) Active. Carvedilol (12.5MG  Tablet, Oral) Active. Durezol (0.05% Emulsion, Ophthalmic) Active. Fenofibrate (145MG  Tablet, Oral) Active. GaviLyte-N with Flavor Pack (420GM For Solution, Oral) Active. Lantus SoloStar (100UNIT/ML Soln Pen-inj, Subcutaneous) Active. Lisinopril (40MG  Tablet, Oral) Active. Simvastatin (20MG  Tablet,  Oral) Active. Medications Reconciled  Social History (Tanisha A. Owens Shark, [place]; 01/21/2017 3:33 PM) Alcohol use  Recently quit alcohol use. Caffeine use  Coffee. No drug use  Tobacco use  Former smoker.  Other Problems (Tanisha A. Owens Shark, [place]; 01/21/2017 3:33 PM) Colon Cancer  Diabetes Mellitus  High blood pressure  Hypercholesterolemia     Review of Systems (Tanisha A. Brown RMA; 01/21/2017 3:33 PM) General Not Present- Appetite Loss, Chills, Fatigue, Fever, Night Sweats, Weight Gain and Weight Loss. Skin Not Present- Change in Wart/Mole, Dryness, Hives, Jaundice, New Lesions, Non-Healing Wounds, Rash and Ulcer. HEENT Present- Ringing in the Ears and Wears glasses/contact lenses. Not Present- Earache, Hearing Loss, Hoarseness, Nose Bleed, Oral Ulcers, Seasonal Allergies, Sinus Pain, Sore Throat, Visual Disturbances and Yellow Eyes. Respiratory Not Present- Bloody sputum, Chronic Cough, Difficulty Breathing, Snoring and Wheezing. Breast Not Present- Breast Mass, Breast Pain, Nipple Discharge and Skin Changes. Cardiovascular Not Present- Chest Pain, Difficulty Breathing Lying Down, Leg Cramps, Palpitations, Rapid Heart Rate, Shortness of Breath and Swelling of Extremities. Gastrointestinal Not Present- Abdominal Pain, Bloating, Bloody Stool, Change in Bowel Habits, Chronic diarrhea, Constipation, Difficulty Swallowing, Excessive gas, Gets full quickly at meals, Hemorrhoids, Indigestion, Nausea, Rectal Pain and Vomiting. Male Genitourinary Not Present- Blood in Urine, Change in Urinary Stream, Frequency, Impotence, Nocturia, Painful Urination, Urgency and Urine Leakage. Musculoskeletal Not Present- Back Pain, Joint Pain, Joint Stiffness, Muscle Pain, Muscle Weakness and Swelling of Extremities. Neurological Not Present- Decreased Memory, Fainting, Headaches, Numbness, Seizures, Tingling, Tremor, Trouble walking and Weakness. Psychiatric Not Present- Anxiety, Bipolar, Change in Sleep Pattern,  Depression, Fearful and Frequent crying. Endocrine Not Present- Cold Intolerance, Excessive Hunger, Hair Changes, Heat Intolerance, Hot flashes and New Diabetes. Hematology Not Present- Blood Thinners, Easy Bruising, Excessive bleeding, Gland problems, HIV and Persistent Infections.  Vitals (Tanisha A. Brown RMA; 01/21/2017 3:34 PM) 01/21/2017 3:33 PM Weight: 192.6 lb Height: 74in Body Surface Area: 2.14 m Body Mass Index: 24.73 kg/m  Temp.: 98.27F  Pulse: 78 (Regular)  P.OX: 98% (Room air) BP: 102/68 (Sitting, Left Arm, Standard)   BP (!) 160/77 (BP Location: Left Arm)   Pulse 65   Temp 97.8 F (36.6 C) (Oral)   Resp 18   Ht 6\' 2"  (1.88 m)   Wt 87.5 kg (193 lb)   SpO2 98%   BMI 24.78 kg/m      Physical Exam Adin Hector MD; 01/21/2017 3:57 PM) General Mental Status-Alert. General Appearance-Not in acute distress, Not Sickly. Orientation-Oriented X3. Hydration-Well hydrated. Voice-Normal.  Integumentary Global Assessment Upon inspection and palpation of skin surfaces of the - Axillae: non-tender, no inflammation or ulceration, no drainage. and Distribution of scalp and body hair is normal. General Characteristics Temperature - normal warmth is noted.  Head and Neck Head-normocephalic, atraumatic with no lesions or palpable masses. Face Global Assessment - atraumatic, no absence of expression. Neck Global Assessment - no abnormal movements, no bruit auscultated on the right, no bruit auscultated on the left, no decreased range of motion, non-tender. Trachea-midline. Thyroid Gland Characteristics - non-tender.  Eye Eyeball - Left-Extraocular movements intact, No Nystagmus. Eyeball - Right-Extraocular movements intact, No Nystagmus. Cornea - Left-No Hazy. Cornea - Right-No Hazy. Sclera/Conjunctiva - Left-No scleral icterus, No Discharge. Sclera/Conjunctiva - Right-No scleral icterus, No Discharge. Pupil - Left-Direct  reaction to light normal. Pupil - Right-Direct reaction to light normal.  ENMT Ears Pinna - Left - no drainage observed, no generalized tenderness observed. Right - no drainage observed, no generalized tenderness observed. Nose and Sinuses External Inspection of the Nose - no destructive lesion observed. Inspection of the nares - Left - quiet respiration.  Right - quiet respiration. Mouth and Throat Lips - Upper Lip - no fissures observed, no pallor noted. Lower Lip - no fissures observed, no pallor noted. Nasopharynx - no discharge present. Oral Cavity/Oropharynx - Tongue - no dryness observed. Oral Mucosa - no cyanosis observed. Hypopharynx - no evidence of airway distress observed.  Chest and Lung Exam Inspection Movements - Normal and Symmetrical. Accessory muscles - No use of accessory muscles in breathing. Palpation Palpation of the chest reveals - Non-tender. Auscultation Breath sounds - Normal and Clear.  Cardiovascular Auscultation Rhythm - Regular. Murmurs & Other Heart Sounds - Auscultation of the heart reveals - No Murmurs and No Systolic Clicks.  Abdomen Inspection Inspection of the abdomen reveals - No Visible peristalsis and No Abnormal pulsations. Umbilicus - No Bleeding, No Urine drainage. Palpation/Percussion Palpation and Percussion of the abdomen reveal - Soft, Non Tender, No Rebound tenderness, No Rigidity (guarding) and No Cutaneous hyperesthesia. Note: Abdomen thin walled and overweight. Midline and left-sided incision is intact. Significant diastases recti but no proof of any incisional hernia. No pain. No guarding.   Male Genitourinary Sexual Maturity Tanner 5 - Adult hair pattern and Adult penile size and shape.  Peripheral Vascular Upper Extremity Inspection - Left - No Cyanotic nailbeds, Not Ischemic. Right - No Cyanotic nailbeds, Not Ischemic.  Neurologic Neurologic evaluation reveals -normal attention span and ability to concentrate, able to  name objects and repeat phrases. Appropriate fund of knowledge , normal sensation and normal coordination. Mental Status Affect - not angry, not paranoid. Cranial Nerves-Normal Bilaterally. Gait-Normal.  Neuropsychiatric Mental status exam performed with findings of-able to articulate well with normal speech/language, rate, volume and coherence, thought content normal with ability to perform basic computations and apply abstract reasoning and no evidence of hallucinations, delusions, obsessions or homicidal/suicidal ideation.  Musculoskeletal Global Assessment Spine, Ribs and Pelvis - no instability, subluxation or laxity. Right Upper Extremity - no instability, subluxation or laxity.  Lymphatic Head & Neck  General Head & Neck Lymphatics: Bilateral - Description - No Localized lymphadenopathy. Axillary  General Axillary Region: Bilateral - Description - No Localized lymphadenopathy. Femoral & Inguinal  Generalized Femoral & Inguinal Lymphatics: Left - Description - No Localized lymphadenopathy. Right - Description - No Localized lymphadenopathy.    Assessment & Plan  POLYP OF CECUM (D12.0) Impression: Recurrent/persistent cecal polyp going into appendiceal orifice.  Standard of care would be laparoscopic appendectomy to get negative margins. I suspect he will need to stay overnight given his advanced age. Possible lysis of adhesions.  The anatomy & physiology of the digestive tract was discussed.  The pathophysiology of the colon was discussed.  Natural history risks without surgery was discussed.   I feel the risks of no intervention will lead to serious problems that outweigh the operative risks; therefore, I recommended a partial colectomy to remove the pathology.  Minimally invasive (Robotic/Laparoscopic) & open techniques were discussed.   Risks such as bleeding, infection, abscess, leak, reoperation, injury to other organs, need for repair of tissues / organs, possible  ostomy, hernia, heart attack, stroke, death, and other risks were discussed.  I noted a good likelihood this will help address the problem.   Goals of post-operative recovery were discussed as well.   Need for adequate nutrition, daily bowel regimen and healthy physical activity, to optimize recovery was noted as well. We will work to minimize complications.  Educational materials were available as well.  Questions were answered.  The patient expresses understanding & wishes to proceed with surgery.  I think a rectal prep is adequate. Don't think he needs a formal full prep for this  Like to get cardiac clearance given his fair exercise tolerance even though he looks like he is rather independent.  Follow-up endoscopy 1 year later per Dr Gala Romney  Dr. Lucia Gaskins has done all his prior abdominal surgeries. Offered to call & have Dr. Lucia Gaskins to take care of him. He did not want top wait for Dr Lucia Gaskins & is happy having me, just whoever is available. Current Plans Pt Education - Polyps in the Colon and Rectum (Colonic and Rectal Polyps): colonic polyps You are being scheduled for surgery- Our schedulers will call you.  You should hear from our office's scheduling department within 5 working days about the location, date, and time of surgery. We try to make accommodations for patient's preferences in scheduling surgery, but sometimes the OR schedule or the surgeon's schedule prevents [place] from making those accommodations.  If you have not heard from our office [phone]) in 5 working days, call the office and ask for your surgeon's nurse.  If you have other questions about your diagnosis, plan, or surgery, call the office and ask for your surgeon's nurse.  I recommended obtaining preoperative cardiac clearance. I am concerned about the health of the patient and the ability to tolerate the operation. Therefore, we will request clearance by cardiology to better assess operative risk & see if a  reevaluation, further workup, etc is needed. Also recommendations on how medications such as for anticoagulation and blood pressure should be managed/held/restarted after surgery.  PREOP COLON - ENCOUNTER FOR PREOPERATIVE EXAMINATION FOR GENERAL SURGICAL PROCEDURE (Z01.818) Current Plans Written instructions provided Pt Education - Pamphlet Given - Laparoscopic Colorectal Surgery: discussed with patient and provided information. Pt Education - CCS Colectomy post-op instructions: discussed with patient and provided information. Pt Education - CCS Rectal Prep for Anorectal outpatient/office surgery: discussed with patient and provided information.  Adin Hector, M.D., F.A.C.S. Gastrointestinal and Minimally Invasive Surgery [facility] [address] [place], [place] [address] [phone] Main / Paging

## 2017-04-26 NOTE — Anesthesia Postprocedure Evaluation (Signed)
Anesthesia Post Note  Patient: Mark Le  Procedure(s) Performed: APPENDECTOMY LAPAROSCOPIC (N/A ) LYSIS OF ADHESION (N/A )     Patient location during evaluation: PACU Anesthesia Type: General Level of consciousness: awake and alert Pain management: pain level controlled Vital Signs Assessment: post-procedure vital signs reviewed and stable Respiratory status: spontaneous breathing, nonlabored ventilation and respiratory function stable Cardiovascular status: blood pressure returned to baseline and stable Postop Assessment: no apparent nausea or vomiting Anesthetic complications: no    Last Vitals:  Vitals:   04/26/17 1308 04/26/17 1324  BP: 131/61 (!) 145/64  Pulse: 64 65  Resp: 12 12  Temp:  36.5 C  SpO2: 100% 99%    Last Pain:  Vitals:   04/26/17 1208  TempSrc:   PainSc: 0-No pain                 Audry Pili

## 2017-04-26 NOTE — Anesthesia Preprocedure Evaluation (Addendum)
Anesthesia Evaluation  Patient identified by MRN, date of birth, ID band Patient awake    Reviewed: Allergy & Precautions, NPO status , Patient's Chart, lab work & pertinent test results, reviewed documented beta blocker date and time   Airway Mallampati: I  TM Distance: >3 FB Neck ROM: Full    Dental  (+) Edentulous Upper, Edentulous Lower   Pulmonary former smoker,    breath sounds clear to auscultation       Cardiovascular hypertension, Pt. on medications + Peripheral Vascular Disease (AAA)   Rhythm:Regular Rate:Normal  EKG - 1st deg AVB  TTE 02/2017 - Mild LVH. Ejection fraction was in the range of 55% to 60%. Grade 1 diastolic dysfunction. AV sclerosis without stenosis, trivial AI. Nuc showed small inferior wall infarct from apex to base with no ischemia.   Neuro/Psych negative neurological ROS  negative psych ROS   GI/Hepatic negative GI ROS, Neg liver ROS,   Endo/Other  diabetes, Type 2  Renal/GU negative Renal ROS  negative genitourinary   Musculoskeletal Gout   Abdominal   Peds  Hematology negative hematology ROS (+)   Anesthesia Other Findings Colon cancer s/p resection, colostomy w/ reversal, and chemo  Reproductive/Obstetrics                            Anesthesia Physical  Anesthesia Plan  ASA: III  Anesthesia Plan: General   Post-op Pain Management:    Induction: Intravenous  PONV Risk Score and Plan: 3 and Ondansetron, Dexamethasone and Treatment may vary due to age or medical condition  Airway Management Planned: Oral ETT  Additional Equipment: None  Intra-op Plan:   Post-operative Plan: Extubation in OR  Informed Consent: I have reviewed the patients History and Physical, chart, labs and discussed the procedure including the risks, benefits and alternatives for the proposed anesthesia with the patient or authorized representative who has indicated his/her  understanding and acceptance.   Dental advisory given  Plan Discussed with: CRNA  Anesthesia Plan Comments:         Anesthesia Quick Evaluation

## 2017-04-26 NOTE — Anesthesia Procedure Notes (Signed)
Procedure Name: Intubation Date/Time: 04/26/2017 10:01 AM Performed by: Claudia Desanctis Pre-anesthesia Checklist: Patient identified, Emergency Drugs available, Suction available and Patient being monitored Patient Re-evaluated:Patient Re-evaluated prior to induction Oxygen Delivery Method: Circle system utilized Preoxygenation: Pre-oxygenation with 100% oxygen Induction Type: IV induction Ventilation: Mask ventilation without difficulty Laryngoscope Size: 2 and Miller Grade View: Grade I Tube type: Oral Tube size: 7.5 mm Number of attempts: 1 Airway Equipment and Method: Stylet Placement Confirmation: ETT inserted through vocal cords under direct vision,  positive ETCO2 and breath sounds checked- equal and bilateral Secured at: 22 cm Tube secured with: Tape Dental Injury: Teeth and Oropharynx as per pre-operative assessment

## 2017-04-26 NOTE — Op Note (Signed)
PATIENT:  Mark Le  82 y.o. male  Patient Care Team: Celene Squibb, MD as PCP - General (Internal Medicine) Gala Romney, Cristopher Estimable, MD as Consulting Physician (Gastroenterology) Michael Boston, MD as Consulting Physician (General Surgery) Satira Sark, MD as Consulting Physician (Cardiology)  PRE-OPERATIVE DIAGNOSIS:  POLYP AT APPENDICEAL ORIFICE  POST-OPERATIVE DIAGNOSIS:  POLYP AT APPENDICEAL ORIFICE  PROCEDURE:   APPENDECTOMY LAPAROSCOPIC LYSIS OF ADHESIONS  SURGEON:  Adin Hector, MD  ANESTHESIA:   local and general  EBL:  Total I/O In: 1100 [I.V.:1100] Out: 275 [Urine:250; Blood:25]  Delay start of Pharmacological VTE agent (>24hrs) due to surgical blood loss or risk of bleeding:  no  DRAINS: none   SPECIMEN:  CECAL WALL &  APPENDIX  DISPOSITION OF SPECIMEN:  PATHOLOGY  COUNTS:  YES  PLAN OF CARE: Admit for overnight observation  PATIENT DISPOSITION:  PACU - hemodynamically stable.   INDICATIONS: Patient with concerning symptoms & work up suspicious for recurrent polyp at appendceal polyp.  Surgery was recommended:  The anatomy & physiology of the digestive tract was discussed.  The pathophysiology of appendicitis was discussed.  Natural history risks without surgery was discussed.   I feel the risks of no intervention will lead to serious problems that outweigh the operative risks; therefore, I recommended diagnostic laparoscopy with removal of appendix to remove the pathology.  Laparoscopic & open techniques were discussed.   I noted a good likelihood this will help address the problem.    Risks such as bleeding, infection, abscess, leak, reoperation, possible ostomy, hernia, heart attack, death, and other risks were discussed.  Goals of post-operative recovery were discussed as well.  We will work to minimize complications.  Questions were answered.  The patient expresses understanding & wishes to proceed with surgery.  OR FINDINGS: 1cm polyp at appendiceal  orifice of cecum - excised.  Severe adhesions LUQ & central abdomen, especially to Parietex mesh.  Primarily omentum in upper abdomen and small bowel infraumbilically.  No transition zone nor obstruction.  DESCRIPTION:   The patient was identified & brought into the operating room.  Informed consent was confirmed.  The patient underwent general anaesthesia without difficulty.  The patient was positioned appropriately.  VTE prevention in place.  The patient's abdomen was clipped, prepped, & draped in a sterile fashion.  Surgical timeout confirmed our plan.  Peritoneal entry with a laparoscopic port was obtained using optical entry technique in the left upper abdomen as the patient was positioned in reverse Trendelenburg.  Entry was clean.  I induced carbon dioxide insufflation.  Camera inspection revealed no injury.  Patient had dense adhesion of greater omentum in the left upper quadrant.  I was g  The peritoneExtra ports were carefully placed under direct laparoscopic visualization.  I was gradually able to free off greater omentum in the left upper quadrant to see the right upper quadrant.  Found an area with no adhesions.  Placed ports in the right upper quadrant.  I did lyse adhesions to free some greater omentum and some small bowel off the anterior abdominal wall to create enough working space.  His able to find the ileocecal region.  Appendix was diving down the pelvis.  Freed off interloop small bowel adhesions until I can get that out of the way and better see the appendix.  Relased internal hernias ofSB-SB & SB-colon interloop adhesions.  I mobilized the terminal ileum to proximal ascending colon in a lateral to medial fashion.  I took care to  avoid injuring any retroperitoneal structures.  I freed the appendix off its attachments to the pelvis and rectosigmoid mesentery.  I elevated the appendix. I skeletonized the mesoappendix. I was able to free off the base of the appendix which was still  viable.  I stapled the appendix & part of the cecum using 40mm firings x2 of the laparoscopic stapler. I took a 2-3cm healthy cuff of viable cecum.  I placed the appendix inside an EcoSac bag and removed out the 12 mm port.  I did copious irrigation. Hemostasis was good in the mesoappendix, colon mesentery, and retroperitoneum. Staple line was intact on the cecum with no bleeding. I washed out the pelvis, retrohepatic space and right paracolic gutter. I washed out the left side as well.  Hemostasis is good. There was no perforation or injury. Because the area cleaned up well after irrigation, I did not place a drain.  I did further lyse adhesions in the upper abdomen and left upper quadrant.  Freed all adhesions around the left upper quadrant port.  Carefully inspected to prove there is of any injury or other abnormality.  I will have to adhesions to the lower part of the mesh that had some small bowel and omentum in place.  The left midabdomen was completely walled off.  Everything was wide open with no internal hernias.  I aspirated the carbon dioxide. I removed the ports.  I closed the 12 m stapler port in the right subcostal region with a #1 PDS suture.  I closed skin using 4-0 monocryl stitch.  Sterile dressings applied.  Patient was extubated and sent to the recovery room.  I discussed the operative findings with the patient's sister.   Given his advanced age and significant lysis adhesions, we will watch him overnight.  I discussed postop care and goals.  Instructions are to been given to the patient.  He will receive more discharge.  Hopefully can go home tomorrow if he meets discharge goals.  Questions answered.  The patient's sister expressed understanding and appreciation.  Adin Hector, M.D., F.A.C.S. Gastrointestinal and Minimally Invasive Surgery [facility] [address], [place], [place] [address] [phone] Main / Paging  04/26/2017 12:08  PM

## 2017-04-26 NOTE — Transfer of Care (Signed)
Immediate Anesthesia Transfer of Care Note  Patient: Mark Le  Procedure(s) Performed: Procedure(s): APPENDECTOMY LAPAROSCOPIC (N/A) LYSIS OF ADHESION (N/A)  Patient Location: PACU  Anesthesia Type:General  Level of Consciousness:  sedated, patient cooperative and responds to stimulation  Airway & Oxygen Therapy:Patient Spontanous Breathing and Patient connected to face mask oxgen  Post-op Assessment:  Report given to PACU RN and Post -op Vital signs reviewed and stable  Post vital signs:  Reviewed and stable  Last Vitals:  Vitals:   04/26/17 0717  BP: (!) 160/77  Pulse: 65  Resp: 18  Temp: 36.6 C  SpO2: 32%    Complications: No apparent anesthesia complications

## 2017-04-26 NOTE — Interval H&P Note (Signed)
History and Physical Interval Note:  04/26/2017 9:41 AM  Mark Le  has presented today for surgery, with the diagnosis of persistent adenomatous polyp at appendiceal orifice of cecum  The various methods of treatment have been discussed with the patient and family. After consideration of risks, benefits and other options for treatment, the patient has consented to  Procedure(s): APPENDECTOMY LAPAROSCOPIC (N/A) LYSIS OF ADHESION (N/A) as a surgical intervention .  The patient's history has been reviewed, patient examined, no change in status, stable for surgery.  I have reviewed the patient's chart and labs.  Questions were answered to the patient's satisfaction.    I have re-reviewed the the patient's records, history, medications, and allergies.  I have re-examined the patient.  I again discussed intraoperative plans and goals of post-operative recovery.  The patient agrees to proceed.  Mark Le  1934-09-02 [id number]  Patient Care Team: Celene Squibb, MD as PCP - General (Internal Medicine) Gala Romney Cristopher Estimable, MD as Consulting Physician (Gastroenterology) Michael Boston, MD as Consulting Physician (General Surgery) Satira Sark, MD as Consulting Physician (Cardiology)  Patient Active Problem List   Diagnosis Date Noted  . Constipation 11/19/2016  . Thoracic aortic aneurysm ([place]) 11/19/2016  . Hx of adenomatous colonic polyps 08/22/2016  . Loss of appetite 05/29/2016  . Abnormal weight loss 05/29/2016  . Ventral hernia 07/24/2011  . Gout 06/04/2011  . LIVER FUNCTION TESTS, ABNORMAL 03/07/2007  . Malignant neoplasm of colon ([place]) 09/12/2006  . DIABETES MELLITUS II, UNCOMPLICATED 66/12/3014  . HYPERTRIGLYCERIDEMIA 09/12/2006  . OBESITY, NOS 09/12/2006  . HYPERTENSION, BENIGN SYSTEMIC 09/12/2006  . TREMOR 09/12/2006    Past Medical History:  Diagnosis Date  . Aneurysm, thoracic aortic ([place]) 08/2016   4.2cm  . Colon cancer ([place]) 2002   T3 N2 tumor with 2 of 4 positive  lymph nodes, s/p resection and colostomy and post-op chemotherapy. reversal of colostomy 2003.   Marland Kitchen Essential hypertension   . Gout 06/04/2011  . Hyperlipidemia   . Type 2 diabetes mellitus ([facility])    not on meds   . Ventral hernia 07/24/2011    Past Surgical History:  Procedure Laterality Date  . CATARACT EXTRACTION W/PHACO Left 09/11/2016   Procedure: CATARACT EXTRACTION PHACO AND INTRAOCULAR LENS PLACEMENT (IOC);  Surgeon: Rutherford Guys, MD;  Location: [facility];  Service: Ophthalmology;  Laterality: Left;  CDE: 19.23  . CATARACT EXTRACTION W/PHACO Right 10/09/2016   Procedure: CATARACT EXTRACTION PHACO AND INTRAOCULAR LENS PLACEMENT (IOC);  Surgeon: Rutherford Guys, MD;  Location: [facility];  Service: Ophthalmology;  Laterality: Right;  CDE: 8.08  . COLONOSCOPY  11/2002   Dr. Alphonsa Overall: anastomosis at 45cm, otherwise unremarkable.  . COLONOSCOPY  05/2006   Dr. Alphonsa Overall: 4 polyps removed.  . COLONOSCOPY  07/2009   Dr. Alphonsa Overall: 2 tubular adenomas, next TCS five years.   . COLONOSCOPY N/A 06/20/2016   Dr. Gala Romney multiple colonic polyps removed, several in piecemeal fashion, a ascending colon polyp tubular adenoma with focal high-grade dysplasia, cecal polyps tubulovillous adenoma. Next colonoscopy June 2018  . COLONOSCOPY N/A 12/27/2016   Procedure: COLONOSCOPY;  Surgeon: Daneil Dolin, MD;  Location: [facility];  Service: Endoscopy;  Laterality: N/A;  1030   . COLOSTOMY REVERSAL  01/2002   Dr. Alphonsa Overall  . ESOPHAGOGASTRODUODENOSCOPY N/A 06/20/2016   Dr. Gala Romney: Large hiatal hernia  . PARTIAL COLECTOMY  04/2001   Dr. Alphonsa Overall: obstructing colon carcinoma of the left colon with resection and colostomy  .  VENTRAL HERNIA REPAIR  04/2005   Dr. Alphonsa Overall    Social History   Social History  . Marital status: Divorced    Spouse name: N/A  . Number of children: N/A  . Years of education: N/A   Occupational History  . Not on file.   Social History Main Topics  . Smoking  status: Former Smoker    Packs/day: 1.50    Years: 15.00    Types: Cigarettes    Quit date: 09/06/1980  . Smokeless tobacco: Never Used  . Alcohol use No  . Drug use: No  . Sexual activity: No   Other Topics Concern  . Not on file   Social History Narrative  . No narrative on file    Family History  Problem Relation Age of Onset  . Colon cancer Neg Hx    Prescriptions Prior to Admission  Medication Sig Dispense Refill Last Dose  . amLODipine (NORVASC) 5 MG tablet Take 5 mg by mouth daily. Patient takes at 400pm daily   04/25/2017 at Unknown time  . aspirin 81 MG tablet Take 81 mg by mouth daily.    04/25/2017 at Unknown time  . carvedilol (COREG) 12.5 MG tablet Take 12.5 mg by mouth 2 (two) times daily with a meal.     04/26/2017 at 0600  . fenofibrate (TRICOR) 145 MG tablet Take 72.5 mg by mouth daily. Takes 1/2 tablet daily   04/25/2017 at Unknown time  . lisinopril (PRINIVIL,ZESTRIL) 40 MG tablet Take 40 mg by mouth daily.   04/25/2017 at Unknown time  . Multiple Vitamins-Minerals (ONE-A-DAY MENS 50+ ADVANTAGE) TABS Take 1 tablet by mouth daily.   04/25/2017 at Unknown time  . simvastatin (ZOCOR) 20 MG tablet Take 20 mg by mouth at bedtime.    04/25/2017 at Unknown time  . polyethylene glycol powder (GLYCOLAX/MIRALAX) powder Take 1 capful (17 g) every evening on days that you do not have a good soft bowel movement. (Patient taking differently: Take 1 Container by mouth daily as needed for mild constipation. Take 1 capful (17 g) every evening on days that you do not have a good soft bowel movement.) 527 g 3 More than a month at Unknown time     Current Facility-Administered Medications  Medication Dose Route Frequency Provider Last Rate Last Dose  . fentaNYL (SUBLIMAZE) injection 25-50 mcg  25-50 mcg Intravenous Q5 min PRN Audry Pili, MD      . lactated ringers infusion   Intravenous Continuous Roderic Palau, MD 50 mL/hr at 04/26/17 0759    . ondansetron (ZOFRAN)  injection 4 mg  4 mg Intravenous Once PRN Audry Pili, MD         Allergies  Allergen Reactions  . Iohexol Hives     Code: HIVES, Desc: pt developed hives on arms, neck and chest after iv injection for ct scan, Onset Date: [id number]     BP (!) 160/77 (BP Location: Left Arm)   Pulse 65   Temp 97.8 F (36.6 C) (Oral)   Resp 18   Ht 6\' 2"  (1.88 m)   Wt 87.5 kg (193 lb)   SpO2 98%   BMI 24.78 kg/m   Labs: Results for orders placed or performed during the hospital encounter of 04/26/17 (from the past 48 hour(s))  Glucose, capillary     Status: Abnormal   Collection Time: 04/26/17  7:30 AM  Result Value Ref Range   Glucose-Capillary 115 (H) 65 - 99 mg/dL  Imaging / Studies: No results found.   Adin Hector, M.D., F.A.C.S. Gastrointestinal and Minimally Invasive Surgery [facility] [address] [place],  [address] [phone] Main / Paging  04/26/2017 9:41 AM     Tavionna Grout C.

## 2017-04-27 ENCOUNTER — Encounter (HOSPITAL_COMMUNITY): Payer: Self-pay | Admitting: Surgery

## 2017-04-27 DIAGNOSIS — I10 Essential (primary) hypertension: Secondary | ICD-10-CM | POA: Diagnosis not present

## 2017-04-27 DIAGNOSIS — I712 Thoracic aortic aneurysm, without rupture: Secondary | ICD-10-CM | POA: Diagnosis not present

## 2017-04-27 DIAGNOSIS — E1151 Type 2 diabetes mellitus with diabetic peripheral angiopathy without gangrene: Secondary | ICD-10-CM | POA: Diagnosis not present

## 2017-04-27 DIAGNOSIS — E7800 Pure hypercholesterolemia, unspecified: Secondary | ICD-10-CM | POA: Diagnosis not present

## 2017-04-27 DIAGNOSIS — E781 Pure hyperglyceridemia: Secondary | ICD-10-CM | POA: Diagnosis not present

## 2017-04-27 DIAGNOSIS — D121 Benign neoplasm of appendix: Secondary | ICD-10-CM | POA: Diagnosis not present

## 2017-04-27 LAB — BASIC METABOLIC PANEL
Anion gap: 7 (ref 5–15)
BUN: 17 mg/dL (ref 6–20)
CO2: 26 mmol/L (ref 22–32)
Calcium: 8.7 mg/dL — ABNORMAL LOW (ref 8.9–10.3)
Chloride: 107 mmol/L (ref 101–111)
Creatinine, Ser: 1.32 mg/dL — ABNORMAL HIGH (ref 0.61–1.24)
GFR calc Af Amer: 57 mL/min — ABNORMAL LOW (ref >60)
GFR, EST NON AFRICAN AMERICAN: 49 mL/min — AB (ref >60)
GLUCOSE: 120 mg/dL — AB (ref 65–99)
POTASSIUM: 4.5 mmol/L (ref 3.5–5.1)
Sodium: 140 mmol/L (ref 135–145)

## 2017-04-27 LAB — CBC
HCT: 32.7 % — ABNORMAL LOW (ref 39.0–52.0)
Hemoglobin: 11.2 g/dL — ABNORMAL LOW (ref 13.0–17.0)
MCH: 31.5 pg (ref 26.0–34.0)
MCHC: 34.3 g/dL (ref 30.0–36.0)
MCV: 92.1 fL (ref 78.0–100.0)
Platelets: 198 10*3/uL (ref 150–400)
RBC: 3.55 MIL/uL — ABNORMAL LOW (ref 4.22–5.81)
RDW: 13.3 % (ref 11.5–15.5)
WBC: 9 10*3/uL (ref 4.0–10.5)

## 2017-04-27 MED ORDER — ADULT MULTIVITAMIN W/MINERALS CH
1.0000 | ORAL_TABLET | Freq: Every day | ORAL | Status: DC
  Administered 2017-04-27: 1 via ORAL
  Filled 2017-04-27: qty 1

## 2017-04-27 NOTE — Progress Notes (Signed)
Nurse reviewed discharge instructions with pt and his wife.  Pt verbalized understanding of discharge instructions, follow up appointment and new medication. Prescription given to pt prior to discharge.

## 2017-04-27 NOTE — Discharge Summary (Signed)
Physician Discharge Summary  Patient ID: Mark Le MRN: [id number] DOB/AGE: 1934/12/16 82 y.o.  Admit date: 04/26/2017 Discharge date: 04/27/2017  Admission Diagnoses: cecal polyp  Discharge Diagnoses:  Principal Problem:   Polyp of cecum s/p lap pertial cecectomy & appendectomy 04/26/2017 Active Problems:   DM (diabetes mellitus) - diet controlled   HYPERTRIGLYCERIDEMIA   OBESITY, NOS   HYPERTENSION, BENIGN SYSTEMIC   Gout   Hx of adenomatous colonic polyps   Constipation   Discharged Condition: good  Hospital Course: Pt did well overnight.  Having flatus and tolerating a diet.  Urinating without difficulty.  Consults: None  Significant Diagnostic Studies: none  Treatments: IV hydration, analgesia: acetaminophen and surgery: lap loa and appendectomy  Discharge Exam: Blood pressure (!) 122/55, pulse (!) 57, temperature 98.4 F (36.9 C), temperature source Oral, resp. rate 18, height 6\' 2"  (1.88 m), weight 87.5 kg (193 lb), SpO2 100 %. General appearance: alert and cooperative GI: normal findings: soft, non-tender Incision/Wound:  Disposition: 01-Home or Self Care  Discharge Instructions    Call MD for:    Complete by:  As directed    FEVER > 101.5 F  (temperatures < 101.5 F are not significant)   Call MD for:  extreme fatigue    Complete by:  As directed    Call MD for:  persistant dizziness or light-headedness    Complete by:  As directed    Call MD for:  persistant nausea and vomiting    Complete by:  As directed    Call MD for:  redness, tenderness, or signs of infection (pain, swelling, redness, odor or green/yellow discharge around incision site)    Complete by:  As directed    Call MD for:  severe uncontrolled pain    Complete by:  As directed    Diet - low sodium heart healthy    Complete by:  As directed    Follow a light diet the first few days at home.   Start with a bland diet such as soups, liquids, starchy foods, low fat foods, etc.   If you  feel full, bloated, or constipated, stay on a full liquid or pureed/blenderized diet for a few days until you feel better and no longer constipated. Be sure to drink plenty of fluids every day to avoid getting dehydrated (feeling dizzy, not urinating, etc.). Gradually add a fiber supplement to your diet   Discharge instructions    Complete by:  As directed    See Discharge Instructions If you are not getting better after two weeks or are noticing you are getting worse, contact our office [phone] for further advice.  We may need to adjust your medications, re-evaluate you in the office, send you to the emergency room, or see what other things we can do to help. The clinic staff is available to answer your questions during regular business hours (8:30am-5pm).  Please don't hesitate to call and ask to speak to one of our nurses for clinical concerns.    A surgeon from [facility] is always on call at the hospitals 24 hours/day If you have a medical emergency, go to the nearest emergency room or call 911.   Driving Restrictions    Complete by:  As directed    You may drive when you are no longer taking narcotic prescription pain medication, you can comfortably wear a seatbelt, and you can safely make sudden turns/stops to protect yourself without hesitating due to pain.   Increase  activity slowly    Complete by:  As directed    Start light daily activities --- self-care, walking, climbing stairs- beginning the day after surgery.  Gradually increase activities as tolerated.  Control your pain to be active.  Stop when you are tired.  Ideally, walk several times a day, eventually an hour a day.   Most people are back to most day-to-day activities in a few weeks.  It takes 4-8 weeks to get back to unrestricted, intense activity. If you can walk 30 minutes without difficulty, it is safe to try more intense activity such as jogging, treadmill, bicycling, low-impact aerobics, swimming,  etc. Save the most intensive and strenuous activity for last (Usually 4-8 weeks after surgery) such as sit-ups, heavy lifting, contact sports, etc.  Refrain from any intense heavy lifting or straining until you are off narcotics for pain control.  You will have off days, but things should improve week-by-week. DO NOT PUSH THROUGH PAIN.  Let pain be your guide: If it hurts to do something, don't do it.  Pain is your body warning you to avoid that activity for another week until the pain goes down.   Lifting restrictions    Complete by:  As directed    If you can walk 30 minutes without difficulty, it is safe to try more intense activity such as jogging, treadmill, bicycling, low-impact aerobics, swimming, etc. Save the most intensive and strenuous activity for last (Usually 4-8 weeks after surgery) such as sit-ups, heavy lifting, contact sports, etc.  Refrain from any intense heavy lifting or straining until you are off narcotics for pain control.  You will have off days, but things should improve week-by-week. DO NOT PUSH THROUGH PAIN.  Let pain be your guide: If it hurts to do something, don't do it.  Pain is your body warning you to avoid that activity for another week until the pain goes down.   May walk up steps    Complete by:  As directed    No wound care    Complete by:  As directed    It is good for closed incision and even open wounds to be washed every day.  Shower every day.  Short baths are fine.  Wash the incisions and wounds clean with soap & water.    If you have a closed incision(s), wash the incision with soap & water every day.  You may leave closed incisions open to air if it is dry.   You may cover the incision with clean gauze & replace it after your daily shower for comfort. If you have skin tapes (Steristrips) or skin glue (Dermabond) on your incision, leave them in place.  They will fall off on their own like a scab.  You may trim any edges that curl up with clean scissors.  If  you have staples, set up an appointment for them to be removed in the office in 10 days after surgery.  If you have a drain, wash around the skin exit site with soap & water and place a new dressing of gauze or band aid around the skin every day.  Keep the drain site clean & dry.   Sexual Activity Restrictions    Complete by:  As directed    You may have sexual intercourse when it is comfortable. If it hurts to do something, stop.     Allergies as of 04/27/2017      Reactions   Iohexol Hives    Code:  HIVES, Desc: pt developed hives on arms, neck and chest after iv injection for ct scan, Onset Date: [id number]      Medication List    TAKE these medications   amLODipine 5 MG tablet Commonly known as:  NORVASC Take 5 mg by mouth daily. Patient takes at 400pm daily   aspirin 81 MG tablet Take 81 mg by mouth daily.   carvedilol 12.5 MG tablet Commonly known as:  COREG Take 12.5 mg by mouth 2 (two) times daily with a meal.   fenofibrate 145 MG tablet Commonly known as:  TRICOR Take 72.5 mg by mouth daily. Takes 1/2 tablet daily   lisinopril 40 MG tablet Commonly known as:  PRINIVIL,ZESTRIL Take 40 mg by mouth daily.   ONE-A-DAY MENS 50+ ADVANTAGE Tabs Take 1 tablet by mouth daily.   polyethylene glycol powder powder Commonly known as:  GLYCOLAX/MIRALAX Take 1 capful (17 g) every evening on days that you do not have a good soft bowel movement.   simvastatin 20 MG tablet Commonly known as:  ZOCOR Take 20 mg by mouth at bedtime.   traMADol 50 MG tablet Commonly known as:  ULTRAM Take 1 tablet (50 mg total) by mouth every 6 (six) hours as needed for moderate pain or severe pain.      Follow-up Information    Michael Boston, MD. Schedule an appointment as soon as possible for a visit in 3 week(s).   Specialty:  General Surgery Why:  To follow up after your operation, To follow up after your hospital stay Contact information: [address]  [address] [phone]           Signed: Rosario Adie 80/99/8338, 8:22 AM

## 2017-04-27 NOTE — Discharge Instructions (Signed)
SURGERY: POST OP INSTRUCTIONS °(Surgery for small bowel obstruction, colon resection, etc) ° ° °###################################################################### ° °EAT °Gradually transition to a high fiber diet with a fiber supplement over the next few days after discharge ° °WALK °Walk an hour a day.  Control your pain to do that.   ° °CONTROL PAIN °Control pain so that you can walk, sleep, tolerate sneezing/coughing, go up/down stairs. ° °HAVE A BOWEL MOVEMENT DAILY °Keep your bowels regular to avoid problems.  OK to try a laxative to override constipation.  OK to use an antidairrheal to slow down diarrhea.  Call if not better after 2 tries ° °CALL IF YOU HAVE PROBLEMS/CONCERNS °Call if you are still struggling despite following these instructions. °Call if you have concerns not answered by these instructions ° °###################################################################### ° ° °DIET °Follow a light diet the first few days at home.  Start with a bland diet such as soups, liquids, starchy foods, low fat foods, etc.  If you feel full, bloated, or constipated, stay on a ful liquid or pureed/blenderized diet for a few days until you feel better and no longer constipated. °Be sure to drink plenty of fluids every day to avoid getting dehydrated (feeling dizzy, not urinating, etc.). °Gradually add a fiber supplement to your diet over the next week.  Gradually get back to a regular solid diet.  Avoid fast food or heavy meals the first week as you are more likely to get nauseated. °It is expected for your digestive tract to need a few months to get back to normal.  It is common for your bowel movements and stools to be irregular.  You will have occasional bloating and cramping that should eventually fade away.  Until you are eating solid food normally, off all pain medications, and back to regular activities; your bowels will not be normal. °Focus on eating a low-fat, high fiber diet the rest of your life  (See Getting to Good Bowel Health, below). ° °CARE of your INCISION or WOUND °It is good for closed incision and even open wounds to be washed every day.  Shower every day.  Short baths are fine.  Wash the incisions and wounds clean with soap & water.    °If you have a closed incision(s), wash the incision with soap & water every day.  You may leave closed incisions open to air if it is dry.   You may cover the incision with clean gauze & replace it after your daily shower for comfort. °If you have skin tapes (Steristrips) or skin glue (Dermabond) on your incision, leave them in place.  They will fall off on their own like a scab.  You may trim any edges that curl up with clean scissors.  If you have staples, set up an appointment for them to be removed in the office in 10 days after surgery.  °If you have a drain, wash around the skin exit site with soap & water and place a new dressing of gauze or band aid around the skin every day.  Keep the drain site clean & dry.    °If you have an open wound with packing, see wound care instructions.  In general, it is encouraged that you remove your dressing and packing, shower with soap & water, and replace your dressing once a day.  Pack the wound with clean gauze moistened with normal (0.9%) saline to keep the wound moist & uninfected.  Pressure on the dressing for 30 minutes will stop most wound   bleeding.  Eventually your body will heal & pull the open wound closed over the next few months.  °Raw open wounds will occasionally bleed or secrete yellow drainage until it heals closed.  Drain sites will drain a little until the drain is removed.  Even closed incisions can have mild bleeding or drainage the first few days until the skin edges scab over & seal.   °If you have an open wound with a wound vac, see wound vac care instructions. ° ° ° ° °ACTIVITIES as tolerated °Start light daily activities --- self-care, walking, climbing stairs-- beginning the day after surgery.   Gradually increase activities as tolerated.  Control your pain to be active.  Stop when you are tired.  Ideally, walk several times a day, eventually an hour a day.   °Most people are back to most day-to-day activities in a few weeks.  It takes 4-8 weeks to get back to unrestricted, intense activity. °If you can walk 30 minutes without difficulty, it is safe to try more intense activity such as jogging, treadmill, bicycling, low-impact aerobics, swimming, etc. °Save the most intensive and strenuous activity for last (Usually 4-8 weeks after surgery) such as sit-ups, heavy lifting, contact sports, etc.  Refrain from any intense heavy lifting or straining until you are off narcotics for pain control.  You will have off days, but things should improve week-by-week. °DO NOT PUSH THROUGH PAIN.  Let pain be your guide: If it hurts to do something, don't do it.  Pain is your body warning you to avoid that activity for another week until the pain goes down. °You may drive when you are no longer taking narcotic prescription pain medication, you can comfortably wear a seatbelt, and you can safely make sudden turns/stops to protect yourself without hesitating due to pain. °You may have sexual intercourse when it is comfortable. If it hurts to do something, stop. ° °MEDICATIONS °Take your usually prescribed home medications unless otherwise directed.   °Blood thinners:  °Usually you can restart any strong blood thinners after the second postoperative day.  It is OK to take aspirin right away.    ° If you are on strong blood thinners (warfarin/Coumadin, Plavix, Xerelto, Eliquis, Pradaxa, etc), discuss with your surgeon, medicine PCP, and/or cardiologist for instructions on when to restart the blood thinner & if blood monitoring is needed (PT/INR blood check, etc).   ° ° °PAIN CONTROL °Pain after surgery or related to activity is often due to strain/injury to muscle, tendon, nerves and/or incisions.  This pain is usually  short-term and will improve in a few months.  °To help speed the process of healing and to get back to regular activity more quickly, DO THE FOLLOWING THINGS TOGETHER: °1. Increase activity gradually.  DO NOT PUSH THROUGH PAIN °2. Use Ice and/or Heat °3. Try Gentle Massage and/or Stretching °4. Take over the counter pain medication °5. Take Narcotic prescription pain medication for more severe pain ° °Good pain control = faster recovery.  It is better to take more medicine to be more active than to stay in bed all day to avoid medications. °1.  Increase activity gradually °Avoid heavy lifting at first, then increase to lifting as tolerated over the next 6 weeks. °Do not “push through” the pain.  Listen to your body and avoid positions and maneuvers than reproduce the pain.  Wait a few days before trying something more intense °Walking an hour a day is encouraged to help your body recover faster   and more safely.  Start slowly and stop when getting sore.  If you can walk 30 minutes without stopping or pain, you can try more intense activity (running, jogging, aerobics, cycling, swimming, treadmill, sex, sports, weightlifting, etc.) °Remember: If it hurts to do it, then don’t do it! °2. Use Ice and/or Heat °You will have swelling and bruising around the incisions.  This will take several weeks to resolve. °Ice packs or heating pads (6-8 times a day, 30-60 minutes at a time) will help sooth soreness & bruising. °Some people prefer to use ice alone, heat alone, or alternate between ice & heat.  Experiment and see what works best for you.  Consider trying ice for the first few days to help decrease swelling and bruising; then, switch to heat to help relax sore spots and speed recovery. °Shower every day.  Short baths are fine.  It feels good!  Keep the incisions and wounds clean with soap & water.   °3. Try Gentle Massage and/or Stretching °Massage at the area of pain many times a day °Stop if you feel pain - do not  overdo it °4. Take over the counter pain medication °This helps the muscle and nerve tissues become less irritable and calm down faster °Choose ONE of the following over-the-counter anti-inflammatory medications: °Acetaminophen 500mg tabs (Tylenol) 1-2 pills with every meal and just before bedtime (avoid if you have liver problems or if you have acetaminophen in you narcotic prescription) °Naproxen 220mg tabs (ex. Aleve, Naprosyn) 1-2 pills twice a day (avoid if you have kidney, stomach, IBD, or bleeding problems) °Ibuprofen 200mg tabs (ex. Advil, Motrin) 3-4 pills with every meal and just before bedtime (avoid if you have kidney, stomach, IBD, or bleeding problems) °Take with food/snack several times a day as directed for at least 2 weeks to help keep pain / soreness down & more manageable. °5. Take Narcotic prescription pain medication for more severe pain °A prescription for strong pain control is often given to you upon discharge (for example: oxycodone/Percocet, hydrocodone/Norco/Vicodin, or tramadol/Ultram) °Take your pain medication as prescribed. °Be mindful that most narcotic prescriptions contain Tylenol (acetaminophen) as well - avoid taking too much Tylenol. °If you are having problems/concerns with the prescription medicine (does not control pain, nausea, vomiting, rash, itching, etc.), please call us [phone] to see if we need to switch you to a different pain medicine that will work better for you and/or control your side effects better. °If you need a refill on your pain medication, you must call the office before 4 pm and on weekdays only.  By federal law, prescriptions for narcotics cannot be called into a pharmacy.  They must be filled out on paper & picked up from our office by the patient or authorized caretaker.  Prescriptions cannot be filled after 4 pm nor on weekends.   ° °WHEN TO CALL US [phone] °Severe uncontrolled or worsening pain  °Fever over 101 F (38.5 C) °Concerns with  the incision: Worsening pain, redness, rash/hives, swelling, bleeding, or drainage °Reactions / problems with new medications (itching, rash, hives, nausea, etc.) °Nausea and/or vomiting °Difficulty urinating °Difficulty breathing °Worsening fatigue, dizziness, lightheadedness, blurred vision °Other concerns °If you are not getting better after two weeks or are noticing you are getting worse, contact our office [phone] for further advice.  We may need to adjust your medications, re-evaluate you in the office, send you to the emergency room, or see what other things we can do to help. °The   clinic staff is available to answer your questions during regular business hours (8:30am-5pm).  Please don’t hesitate to call and ask to speak to one of our nurses for clinical concerns.    °A surgeon from [facility] is always on call at the hospitals 24 hours/day °If you have a medical emergency, go to the nearest emergency room or call 911. ° °FOLLOW UP in our office °One the day of your discharge from the hospital (or the next business weekday), please call [facility] to set up or confirm an appointment to see your surgeon in the office for a follow-up appointment.  Usually it is 2-3 weeks after your surgery.   °If you have skin staples at your incision(s), let the office know so we can set up a time in the office for the nurse to remove them (usually around 10 days after surgery). °Make sure that you call for appointments the day of discharge (or the next business weekday) from the hospital to ensure a convenient appointment time. °IF YOU HAVE DISABILITY OR FAMILY LEAVE FORMS, BRING THEM TO THE OFFICE FOR PROCESSING.  DO NOT GIVE THEM TO YOUR DOCTOR. ° °[facility] °[address], [address], [place], [place]  [address] ? °[phone] - Main °[phone] - Toll Free,  [phone] - Fax °[email or web address] ° °GETTING TO GOOD BOWEL HEALTH. °It is  expected for your digestive tract to need a few months to get back to normal.  It is common for your bowel movements and stools to be irregular.  You will have occasional bloating and cramping that should eventually fade away.  Until you are eating solid food normally, off all pain medications, and back to regular activities; your bowels will not be normal.   °Avoiding constipation °The goal: ONE SOFT BOWEL MOVEMENT A DAY!    °Drink plenty of fluids.  Choose water first. °TAKE A FIBER SUPPLEMENT EVERY DAY THE REST OF YOUR LIFE °During your first week back home, gradually add back a fiber supplement every day °Experiment which form you can tolerate.   There are many forms such as powders, tablets, wafers, gummies, etc °Psyllium bran (Metamucil), methylcellulose (Citrucel), Miralax or Glycolax, Benefiber, Flax Seed.  °Adjust the dose week-by-week (1/2 dose/day to 6 doses a day) until you are moving your bowels 1-2 times a day.  Cut back the dose or try a different fiber product if it is giving you problems such as diarrhea or bloating. °Sometimes a laxative is needed to help jump-start bowels if constipated until the fiber supplement can help regulate your bowels.  If you are tolerating eating & you are farting, it is okay to try a gentle laxative such as double dose MiraLax, prune juice, or Milk of Magnesia.  Avoid using laxatives too often. °Stool softeners can sometimes help counteract the constipating effects of narcotic pain medicines.  It can also cause diarrhea, so avoid using for too long. °If you are still constipated despite taking fiber daily, eating solids, and a few doses of laxatives, call our office. °Controlling diarrhea °Try drinking liquids and eating bland foods for a few days to avoid stressing your intestines further. °Avoid dairy products (especially milk & ice cream) for a short time.  The intestines often can lose the ability to digest lactose when stressed. °Avoid foods that cause gassiness or  bloating.  Typical foods include beans and other legumes, cabbage, broccoli, and dairy foods.  Avoid greasy, spicy, fast foods.  Every person has   some sensitivity to other foods, so listen to your body and avoid those foods that trigger problems for you. °Probiotics (such as active yogurt, Align, etc) may help repopulate the intestines and colon with normal bacteria and calm down a sensitive digestive tract °Adding a fiber supplement gradually can help thicken stools by absorbing excess fluid and retrain the intestines to act more normally.  Slowly increase the dose over a few weeks.  Too much fiber too soon can backfire and cause cramping & bloating. °It is okay to try and slow down diarrhea with a few doses of antidiarrheal medicines.   °Bismuth subsalicylate (ex. Kayopectate, Pepto Bismol) for a few doses can help control diarrhea.  Avoid if pregnant.   °Loperamide (Imodium) can slow down diarrhea.  Start with one tablet (2mg) first.  Avoid if you are having fevers or severe pain.  °ILEOSTOMY PATIENTS WILL HAVE CHRONIC DIARRHEA since their colon is not in use.    °Drink plenty of liquids.  You will need to drink even more glasses of water/liquid a day to avoid getting dehydrated. °Record output from your ileostomy.  Expect to empty the bag every 3-4 hours at first.  Most people with a permanent ileostomy empty their bag 4-6 times at the least.   °Use antidiarrheal medicine (especially Imodium) several times a day to avoid getting dehydrated.  Start with a dose at bedtime & breakfast.  Adjust up or down as needed.  Increase antidiarrheal medications as directed to avoid emptying the bag more than 8 times a day (every 3 hours). °Work with your wound ostomy nurse to learn care for your ostomy.  See ostomy care instructions. °TROUBLESHOOTING IRREGULAR BOWELS °1) Start with a soft & bland diet. No spicy, greasy, or fried foods.  °2) Avoid gluten/wheat or dairy products from diet to see if symptoms improve. °3) Miralax  17gm or flax seed mixed in 8oz. water or juice-daily. May use 2-4 times a day as needed. °4) Gas-X, Phazyme, etc. as needed for gas & bloating.  °5) Prilosec (omeprazole) over-the-counter as needed °6)  Consider probiotics (Align, Activa, etc) to help calm the bowels down ° °Call your doctor if you are getting worse or not getting better.  Sometimes further testing (cultures, endoscopy, X-ray studies, CT scans, bloodwork, etc.) may be needed to help diagnose and treat the cause of the diarrhea. °[facility] °[address], [address], [place], [place]  [address] °[phone] - Main.    °[phone]  - Toll Free.   [phone] - Fax °[email or web address] ° ° °GETTING TO GOOD BOWEL HEALTH. ° °###################################################################### ° °EAT °Gradually transition to a high fiber diet with a fiber supplement over the next few weeks after discharge.  Start with a pureed / full liquid diet (see below) ° °WALK °Walk an hour a day.  Control your pain to do that.   ° °HAVE A BOWEL MOVEMENT DAILY °Keep your bowels regular to avoid problems.  OK to try a laxative to override constipation.  OK to use an antidairrheal to slow down diarrhea.  Call if not better after 2 tries ° °CALL IF YOU HAVE PROBLEMS/CONCERNS °Call if you are still struggling despite following these instructions. °Call if you have concerns not answered by these instructions ° °###################################################################### ° ° °Irregular bowel habits such as constipation and diarrhea can lead to many problems over time.  Having one soft bowel movement a day is the most important way to prevent further problems.  The anorectal canal is   designed to handle stretching and feces to safely manage our ability to get rid of solid waste (feces, poop, stool) out of our body.  BUT, hard constipated stools can act like ripping concrete bricks and diarrhea can be a burning fire to  this very sensitive area of our body, causing inflamed hemorrhoids, anal fissures, increasing risk is perirectal abscesses, abdominal pain/bloating, an making irritable bowel worse.     ° °The goal: ONE SOFT BOWEL MOVEMENT A DAY!  To have soft, regular bowel movements:  °• Drink plenty of fluids, consider 4-6 tall glasses of water a day.   °• Take plenty of fiber.  Fiber is the undigested part of plant food that passes into the colon, acting s “natures broom” to encourage bowel motility and movement.  Fiber can absorb and hold large amounts of water. This results in a larger, bulkier stool, which is soft and easier to pass. Work gradually over several weeks up to 6 servings a day of fiber (25g a day even more if needed) in the form of: °o Vegetables -- Root (potatoes, carrots, turnips), leafy green (lettuce, salad greens, celery, spinach), or cooked high residue (cabbage, broccoli, etc) °o Fruit -- Fresh (unpeeled skin & pulp), Dried (prunes, apricots, cherries, etc ),  or stewed ( applesauce)  °o Whole grain breads, pasta, etc (whole wheat)  °o Bran cereals  °• Bulking Agents -- This type of water-retaining fiber generally is easily obtained each day by one of the following:  °o Psyllium bran -- The psyllium plant is remarkable because its ground seeds can retain so much water. This product is available as Metamucil, Konsyl, Effersyllium, Per Diem Fiber, or the less expensive generic preparation in drug and health food stores. Although labeled a laxative, it really is not a laxative.  °o Methylcellulose -- This is another fiber derived from wood which also retains water. It is available as Citrucel. °o Polyethylene Glycol - and “artificial” fiber commonly called Miralax or Glycolax.  It is helpful for people with gassy or bloated feelings with regular fiber °o Flax Seed - a less gassy fiber than psyllium °• No reading or other relaxing activity while on the toilet. If bowel movements take longer than 5 minutes, you  are too constipated °• AVOID CONSTIPATION.  High fiber and water intake usually takes care of this.  Sometimes a laxative is needed to stimulate more frequent bowel movements, but  °• Laxatives are not a good long-term solution as it can wear the colon out.  They can help jump-start bowels if constipated, but should be relied on constantly without discussing with your doctor °o Osmotics (Milk of Magnesia, Fleets phosphosoda, Magnesium citrate, MiraLax, GoLytely) are safer than  °o Stimulants (Senokot, Castor Oil, Dulcolax, Ex Lax)    °o Avoid taking laxatives for more than 7 days in a row. °•  IF SEVERELY CONSTIPATED, try a Bowel Retraining Program: °o Do not use laxatives.  °o Eat a diet high in roughage, such as bran cereals and leafy vegetables.  °o Drink six (6) ounces of prune or apricot juice each morning.  °o Eat two (2) large servings of stewed fruit each day.  °o Take one (1) heaping tablespoon of a psyllium-based bulking agent twice a day. Use sugar-free sweetener when possible to avoid excessive calories.  °o Eat a normal breakfast.  °o Set aside 15 minutes after breakfast to sit on the toilet, but do not strain to have a bowel movement.  °o If you do not   have a bowel movement by the third day, use an enema and repeat the above steps.  °• Controlling diarrhea °o Switch to liquids and simpler foods for a few days to avoid stressing your intestines further. °o Avoid dairy products (especially milk & ice cream) for a short time.  The intestines often can lose the ability to digest lactose when stressed. °o Avoid foods that cause gassiness or bloating.  Typical foods include beans and other legumes, cabbage, broccoli, and dairy foods.  Every person has some sensitivity to other foods, so listen to our body and avoid those foods that trigger problems for you. °o Adding fiber (Citrucel, Metamucil, psyllium, Miralax) gradually can help thicken stools by absorbing excess fluid and retrain the intestines to act  more normally.  Slowly increase the dose over a few weeks.  Too much fiber too soon can backfire and cause cramping & bloating. °o Probiotics (such as active yogurt, Align, etc) may help repopulate the intestines and colon with normal bacteria and calm down a sensitive digestive tract.  Most studies show it to be of mild help, though, and such products can be costly. °o Medicines: °- Bismuth subsalicylate (ex. Kayopectate, Pepto Bismol) every 30 minutes for up to 6 doses can help control diarrhea.  Avoid if pregnant. °- Loperamide (Immodium) can slow down diarrhea.  Start with two tablets (4mg total) first and then try one tablet every 6 hours.  Avoid if you are having fevers or severe pain.  If you are not better or start feeling worse, stop all medicines and call your doctor for advice °o Call your doctor if you are getting worse or not better.  Sometimes further testing (cultures, endoscopy, X-ray studies, bloodwork, etc) may be needed to help diagnose and treat the cause of the diarrhea. ° °TROUBLESHOOTING IRREGULAR BOWELS °1) Avoid extremes of bowel movements (no bad constipation/diarrhea) °2) Miralax 17gm mixed in 8oz. water or juice-daily. May use BID as needed.  °3) Gas-x,Phazyme, etc. as needed for gas & bloating.  °4) Soft,bland diet. No spicy,greasy,fried foods.  °5) Prilosec over-the-counter as needed  °6) May hold gluten/wheat products from diet to see if symptoms improve.  °7)  May try probiotics (Align, Activa, etc) to help calm the bowels down °7) If symptoms become worse call back immediately. ° °

## 2017-04-27 NOTE — Progress Notes (Signed)
PT Cancellation Note  Patient Details Name: RAMZEY PETROVIC MRN: [id number] DOB: 11-24-1934   Cancelled Treatment:  Order received and checked with nursing and patient.  Discharge orders have already been written and pt and nursing report he has been up walking in the hall with no difficulty.  Pt states he needs no PT      Helene Kelp K. Owens Shark, PT  [place] Levo 04/27/2017, 9:00 AM

## 2017-04-29 ENCOUNTER — Encounter (HOSPITAL_COMMUNITY): Payer: Self-pay | Admitting: Surgery

## 2017-06-09 ENCOUNTER — Telehealth: Payer: Self-pay | Admitting: Gastroenterology

## 2017-06-09 NOTE — Telephone Encounter (Signed)
Please make f/u with [facility]MR only 07/2017 for weight loss, s/p appendectomy/partial colectomy,

## 2017-06-10 ENCOUNTER — Encounter: Payer: Self-pay | Admitting: Internal Medicine

## 2017-06-10 NOTE — Telephone Encounter (Signed)
APPT MADE AND LETTER SENT  °

## 2017-06-27 ENCOUNTER — Encounter: Payer: Self-pay | Admitting: *Deleted

## 2017-06-27 ENCOUNTER — Telehealth: Payer: Self-pay | Admitting: Internal Medicine

## 2017-06-27 NOTE — Telephone Encounter (Signed)
Letter mailed to pt.  

## 2017-06-27 NOTE — Telephone Encounter (Signed)
RECALL FOR ABD ULTRASOUND 

## 2017-07-02 DIAGNOSIS — I10 Essential (primary) hypertension: Secondary | ICD-10-CM | POA: Diagnosis not present

## 2017-07-02 DIAGNOSIS — D631 Anemia in chronic kidney disease: Secondary | ICD-10-CM | POA: Diagnosis not present

## 2017-07-02 DIAGNOSIS — E782 Mixed hyperlipidemia: Secondary | ICD-10-CM | POA: Diagnosis not present

## 2017-07-02 DIAGNOSIS — E1122 Type 2 diabetes mellitus with diabetic chronic kidney disease: Secondary | ICD-10-CM | POA: Diagnosis not present

## 2017-07-03 ENCOUNTER — Encounter: Payer: Self-pay | Admitting: *Deleted

## 2017-07-03 ENCOUNTER — Telehealth: Payer: Self-pay | Admitting: *Deleted

## 2017-07-03 DIAGNOSIS — K7460 Unspecified cirrhosis of liver: Secondary | ICD-10-CM

## 2017-07-03 NOTE — Telephone Encounter (Signed)
Patient received recall letter for ultrasound. Pt is scheduled for 08/08/17 at 8:30am. Npo after midnight. Pt is aware of appt. Letter mailed to pt.

## 2017-07-05 DIAGNOSIS — D509 Iron deficiency anemia, unspecified: Secondary | ICD-10-CM | POA: Diagnosis not present

## 2017-07-05 DIAGNOSIS — E782 Mixed hyperlipidemia: Secondary | ICD-10-CM | POA: Diagnosis not present

## 2017-07-05 DIAGNOSIS — Z0001 Encounter for general adult medical examination with abnormal findings: Secondary | ICD-10-CM | POA: Diagnosis not present

## 2017-07-05 DIAGNOSIS — E1140 Type 2 diabetes mellitus with diabetic neuropathy, unspecified: Secondary | ICD-10-CM | POA: Diagnosis not present

## 2017-07-05 DIAGNOSIS — Z6826 Body mass index (BMI) 26.0-26.9, adult: Secondary | ICD-10-CM | POA: Diagnosis not present

## 2017-07-05 DIAGNOSIS — Z86010 Personal history of colonic polyps: Secondary | ICD-10-CM | POA: Diagnosis not present

## 2017-07-05 DIAGNOSIS — N183 Chronic kidney disease, stage 3 (moderate): Secondary | ICD-10-CM | POA: Diagnosis not present

## 2017-07-30 ENCOUNTER — Encounter: Payer: Self-pay | Admitting: Internal Medicine

## 2017-07-30 ENCOUNTER — Ambulatory Visit (INDEPENDENT_AMBULATORY_CARE_PROVIDER_SITE_OTHER): Payer: Medicare Other | Admitting: Internal Medicine

## 2017-07-30 DIAGNOSIS — R932 Abnormal findings on diagnostic imaging of liver and biliary tract: Secondary | ICD-10-CM | POA: Diagnosis not present

## 2017-07-30 NOTE — Patient Instructions (Addendum)
Keep appointment for ultrasound later this month  Further recommendations to follow  As previously discussed and again discussed today, you will need an annual CTA to keep a check on your anerysm

## 2017-07-30 NOTE — Progress Notes (Signed)
Primary Care Physician:  Celene Squibb, MD Primary Gastroenterologist:  Dr. Gala Romney  Pre-Procedure History & Physical: HPI:  Mark Le is a 83 y.o. male here for follow-up of weight loss/anorexia. Status post appendectomy. 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 He got through surgery just fine. He states he's doing very well;  he eats well;  has no abdominal pain-no complaints. I note his weight is stable from last office visit. History of thoracic aneurysm and questionable chronic liver disease on imaging.  Past Medical History:  Diagnosis Date  . Aneurysm, thoracic aortic ([place]) 08/2016   4.2cm  . Colon cancer ([place]) 2002   T3 N2 tumor with 2 of 4 positive lymph nodes, s/p resection and colostomy and post-op chemotherapy. reversal of colostomy 2003.   Marland Kitchen Essential hypertension   . Gout 06/04/2011  . Hyperlipidemia   . Type 2 diabetes mellitus ([facility])    not on meds   . Ventral hernia 07/24/2011    Past Surgical History:  Procedure Laterality Date  . CATARACT EXTRACTION W/PHACO Left 09/11/2016   Procedure: CATARACT EXTRACTION PHACO AND INTRAOCULAR LENS PLACEMENT (IOC);  Surgeon: Rutherford Guys, MD;  Location: [facility];  Service: Ophthalmology;  Laterality: Left;  CDE: 19.23  . CATARACT EXTRACTION W/PHACO Right 10/09/2016   Procedure: CATARACT EXTRACTION PHACO AND INTRAOCULAR LENS PLACEMENT (IOC);  Surgeon: Rutherford Guys, MD;  Location: [facility];  Service: Ophthalmology;  Laterality: Right;  CDE: 8.08  . COLONOSCOPY  11/2002   Dr. Alphonsa Overall: anastomosis at 45cm, otherwise unremarkable.  . COLONOSCOPY  05/2006   Dr. Alphonsa Overall: 4 polyps removed.  . COLONOSCOPY  07/2009   Dr. Alphonsa Overall: 2 tubular adenomas, next TCS five years.   . COLONOSCOPY N/A 06/20/2016   Dr. Gala Romney multiple colonic polyps removed, several in piecemeal fashion, a ascending colon polyp tubular adenoma with focal high-grade dysplasia, cecal polyps tubulovillous adenoma. Next colonoscopy June  2018  . COLONOSCOPY N/A 12/27/2016   Procedure: COLONOSCOPY;  Surgeon: Daneil Dolin, MD;  Location: [facility];  Service: Endoscopy;  Laterality: N/A;  1030   . COLOSTOMY REVERSAL  01/2002   Dr. Alphonsa Overall  . ESOPHAGOGASTRODUODENOSCOPY N/A 06/20/2016   Dr. Gala Romney: Large hiatal hernia  . LAPAROSCOPIC APPENDECTOMY N/A 04/26/2017   Procedure: APPENDECTOMY LAPAROSCOPIC;  Surgeon: Michael Boston, MD;  Location: [facility];  Service: General;  Laterality: N/A;  . LYSIS OF ADHESION N/A 04/26/2017   Procedure: LYSIS OF ADHESION;  Surgeon: Michael Boston, MD;  Location: [facility];  Service: General;  Laterality: N/A;  . PARTIAL COLECTOMY  04/2001   Dr. Alphonsa Overall: obstructing colon carcinoma of the left colon with resection and colostomy  . VENTRAL HERNIA REPAIR  04/2005   Dr. Alphonsa Overall    Prior to Admission medications   Medication Sig Start Date End Date Taking? Authorizing Provider  amLODipine (NORVASC) 5 MG tablet Take 5 mg by mouth daily. Patient takes at 400pm daily   Yes [provider]  aspirin 81 MG tablet Take 81 mg by mouth daily.    Yes [provider]  carvedilol (COREG) 12.5 MG tablet Take 12.5 mg by mouth 2 (two) times daily with a meal.     Yes [provider]  fenofibrate (TRICOR) 145 MG tablet Take 72.5 mg by mouth daily. Takes 1/2 tablet daily   Yes [provider]  lisinopril (PRINIVIL,ZESTRIL) 40 MG tablet Take 40 mg by mouth daily.   Yes [provider]  Multiple Vitamins-Minerals (  ONE-A-DAY MENS 50+ ADVANTAGE) TABS Take 1 tablet by mouth daily.   Yes [provider]  polyethylene glycol powder (GLYCOLAX/MIRALAX) powder Take 1 capful (17 g) every evening on days that you do not have a good soft bowel movement. Patient taking differently: as needed. Take 1 capful (17 g) every evening on days that you do not have a good soft bowel movement. 11/19/16  Yes Mahala Menghini, PA-C  simvastatin (ZOCOR) 20 MG tablet Take 20 mg by  mouth at bedtime.    Yes [provider]  traMADol (ULTRAM) 50 MG tablet Take 1 tablet (50 mg total) by mouth every 6 (six) hours as needed for moderate pain or severe pain. 04/26/17  Liston Alba, MD    Allergies as of 07/30/2017 - Review Complete 07/30/2017  Allergen Reaction Noted  . Iohexol Hives 05/21/2007    Family History  Problem Relation Age of Onset  . Colon cancer Neg Hx     Social History   Socioeconomic History  . Marital status: Divorced    Spouse name: Not on file  . Number of children: Not on file  . Years of education: Not on file  . Highest education level: Not on file  Social Needs  . Financial resource strain: Not on file  . Food insecurity - worry: Not on file  . Food insecurity - inability: Not on file  . Transportation needs - medical: Not on file  . Transportation needs - non-medical: Not on file  Occupational History  . Not on file  Tobacco Use  . Smoking status: Former Smoker    Packs/day: 1.50    Years: 15.00    Pack years: 22.50    Types: Cigarettes    Last attempt to quit: 09/06/1980    Years since quitting: 36.9  . Smokeless tobacco: Never Used  Substance and Sexual Activity  . Alcohol use: No  . Drug use: No  . Sexual activity: No    Birth control/protection: None  Other Topics Concern  . Not on file  Social History Narrative  . Not on file    Review of Systems: See HPI, otherwise negative ROS  Physical Exam: BP 118/60   Pulse 71   Temp (!) 97 F (36.1 C) (Oral)   Ht 6\' 2"  (1.88 m)   Wt 196 lb 6.4 oz (89.1 kg)   BMI 25.22 kg/m  General:   Alert,  Disheveled and malodorous, pleasant and cooperative in NAD SNeck:  Supple; no masses or thyromegaly. No significant cervical adenopathy. Lungs:  Clear throughout to auscultation.   No wheezes, crackles, or rhonchi. No acute distress. Heart:  Regular rate and rhythm; no murmurs, clicks, rubs,  or gallops. Abdomen: Non-distended, normal bowel sounds.  Soft and  nontender without appreciable mass or hepatosplenomegaly.  Pulses:  Normal pulses noted. Extremities:  Without clubbing or edema.  Impression:  He's done very well since having residual appendiceal tubulovillous adenoma removed via appendectomy. I think we can forego any future colonoscopies unless he develops new symptoms.  Weight loss stabilized. No warning symptoms at this time. History of a thoracic aneurysm. Question of chronic liver disease on imaging  Recommendations: Keep appointment for ultrasound later this month  Further recommendations to follow  As previously discussed and discussed again today, you will need an annual CTA to keep a check on your anuerysm - for your primary care physician.        Notice: This dictation was prepared with Dragon dictation along with  smaller phrase technology. Any transcriptional errors that result from this process are unintentional and may not be corrected upon review.

## 2017-08-08 ENCOUNTER — Ambulatory Visit (HOSPITAL_COMMUNITY)
Admission: RE | Admit: 2017-08-08 | Discharge: 2017-08-08 | Disposition: A | Discharge status: Home / Self Care | Payer: Medicare Other | Source: Ambulatory Visit | Attending: Gastroenterology | Admitting: Gastroenterology

## 2017-08-08 DIAGNOSIS — K7460 Unspecified cirrhosis of liver: Secondary | ICD-10-CM

## 2017-08-08 DIAGNOSIS — K8020 Calculus of gallbladder without cholecystitis without obstruction: Secondary | ICD-10-CM | POA: Insufficient documentation

## 2017-08-08 DIAGNOSIS — K769 Liver disease, unspecified: Secondary | ICD-10-CM | POA: Insufficient documentation

## 2017-08-13 NOTE — Progress Notes (Signed)
This [place] was ordered under my name, but I haven't personally seen patient. There was a question of cirrhosis on prior imaging. He was recently seen in the office by Dr. Gala Romney earlier this month. Would recommend non-urgent visit in next 4-6 weeks (has seen Magda Paganini before) to make sure any loose ends are addressed regarding evaluation for liver disease. (such as vaccination status, discussion of variceal screening, any labs, etc).

## 2017-08-15 ENCOUNTER — Encounter: Payer: Self-pay | Admitting: Internal Medicine

## 2017-08-15 NOTE — Progress Notes (Signed)
PATIENT SCHEDULED AND LETTER SENT  °

## 2017-09-30 ENCOUNTER — Encounter: Payer: Self-pay | Admitting: Gastroenterology

## 2017-09-30 ENCOUNTER — Ambulatory Visit (INDEPENDENT_AMBULATORY_CARE_PROVIDER_SITE_OTHER): Payer: Medicare Other | Admitting: Gastroenterology

## 2017-09-30 VITALS — BP 112/63 | HR 90 | Temp 97.7°F | Ht 74.0 in | Wt 193.6 lb

## 2017-09-30 DIAGNOSIS — R634 Abnormal weight loss: Secondary | ICD-10-CM | POA: Diagnosis not present

## 2017-09-30 DIAGNOSIS — R932 Abnormal findings on diagnostic imaging of liver and biliary tract: Secondary | ICD-10-CM | POA: Diagnosis not present

## 2017-09-30 DIAGNOSIS — R630 Anorexia: Secondary | ICD-10-CM | POA: Diagnosis not present

## 2017-09-30 NOTE — Progress Notes (Signed)
CC'D TO PCP °

## 2017-09-30 NOTE — Assessment & Plan Note (Signed)
Weight loss stable. Anorexia persists. Extensive evaluation of previously outlined. Patient opposed to nutrition consult or appetite stimulant and wants to just monitor for further weight loss.   Recheck in 3 months. Call sooner if greater than five pound weight loss.

## 2017-09-30 NOTE — Progress Notes (Signed)
Primary Care Physician: Celene Squibb, MD  Primary Gastroenterologist:  Garfield Cornea, MD   Chief Complaint  Patient presents with  . Weight Loss    appetite is not good     HPI: Mark Le is a 83 y.o. male here for follow up. He has h/o weight loss/poor appetite, nodular liver on CT imaging. Last seen in 07/2017. History of multiple adenomatous colon polyps, 1 growing out of the appendiceal orifice. Pathology revealed tubulovillous adenoma. Entire polyp could not be removed on second TCS in June. Appendectomy recommended and he had this on 04/26/17. Path showed tubulovillous adenoma and no high grade dysplasia.   Patient had a CT abdomen and pelvis without contrast (dye allergy) 04/2016 with evidence of gallstones, enlarging right kidney lesion, aortoiliac arthrosclerosis. Follow-up MRI abdomen with and without contrast showed benign cyst in the kidneys, no evidence of renal neoplasm.  CT chest without contrast for unintentional weight loss 08/2016. He had mild aneurysm dilation of the ascending thoracic aorta measuring 4.2 cm. 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 Stones noted within the gallbladder. Otherwise gallbladder appeared unremarkable.   He has not had annual imaging via CT or MR for thoracic aneurysm. He sees PCP next month and will ask him about it. Copy of CT provided to patient today.   His weight is overall stable since 04/2017. His appetite is still poor. Always eats a good breakfast out. Cheese sandwich for lunch and snacks on Sugar free cookies. His last HgA1C was less than 6. He is no longer on DM meds. I have requested him to have nutrition consult for calorie count before and he has declined. I have suggested appetite stimulant but again declined. He weighs him self at home several times weekly.   He denies abd pain, n/v, heartburn, dysphagia, melena, brbpr, constipation, diarrhea.    He believes he  may have been vaccinated for Hep B recently but we are trying to determine.    Current Outpatient Medications  Medication Sig Dispense Refill  . amLODipine (NORVASC) 5 MG tablet Take 5 mg by mouth daily. Patient takes at 400pm daily    . aspirin 81 MG tablet Take 81 mg by mouth daily.     . carvedilol (COREG) 12.5 MG tablet Take 12.5 mg by mouth 2 (two) times daily with a meal.      . fenofibrate (TRICOR) 145 MG tablet Take 72.5 mg by mouth daily. Takes 1/2 tablet daily    . lisinopril (PRINIVIL,ZESTRIL) 40 MG tablet Take 40 mg by mouth daily.    . Multiple Vitamins-Minerals (ONE-A-DAY MENS 50+ ADVANTAGE) TABS Take 1 tablet by mouth daily.    . simvastatin (ZOCOR) 20 MG tablet Take 20 mg by mouth at bedtime.      No current facility-administered medications for this visit.     Allergies as of 09/30/2017 - Review Complete 09/30/2017  Allergen Reaction Noted  . Iohexol Hives 05/21/2007    ROS:  General: Negative for   fever, chills, fatigue, weakness. See hpi ENT: Negative for hoarseness, difficulty swallowing , nasal congestion. CV: Negative for chest pain, angina, palpitations, dyspnea on exertion, peripheral edema.  Respiratory: Negative for dyspnea at rest, dyspnea on exertion, cough, sputum, wheezing.  GI: See history of present illness. GU:  Negative for dysuria, hematuria, urinary incontinence, urinary frequency, nocturnal urination.  Endo: Negative for unusual weight change.    Physical Examination:   BP 112/63  Pulse 90   Temp 97.7 F (36.5 C) (Oral)   Ht 6\' 2"  (1.88 m)   Wt 193 lb 9.6 oz (87.8 kg)   BMI 24.86 kg/m   General: Well-nourished, well-developed in no acute distress. Somewhat disheveled but at baseline.  Eyes: No icterus. Mouth: Oropharyngeal mucosa moist and pink , no lesions erythema or exudate. Lungs: Clear to auscultation bilaterally.  Heart: Regular rate and rhythm, no murmurs rubs or gallops.  Abdomen: Bowel sounds are normal, nontender,  nondistended, no hepatosplenomegaly or masses, no abdominal bruits or hernia , no rebound or guarding.   Extremities: No lower extremity edema. No clubbing or deformities. Neuro: Alert and oriented x 4   Skin: Warm and dry, no jaundice.   Psych: Alert and cooperative, normal mood and affect.  Labs:  Labs from 07/02/2017 reviewed. See scanned results.   Imaging Studies: No results found.

## 2017-09-30 NOTE — Progress Notes (Signed)
Please NIC for RUQ U/S in 01/2018 for hepatoma screening.

## 2017-09-30 NOTE — Progress Notes (Signed)
ON RECALL  °

## 2017-09-30 NOTE — Assessment & Plan Note (Signed)
Compensated cirrhosis. Recent u/s without hepatoma. Hep A and B vaccine status not clear, we are requesting records of recent series of vaccinations patient reports. Will plan on labs and hepatoma screening via u/s every six months. Return to the office in 3 months for follow up.

## 2017-09-30 NOTE — Patient Instructions (Signed)
1. Please continue to eat enough to maintain your weight. Let me know if your weight drops by more than 5 pounds.  2. Please discuss having follow up Chest CTA to reassess thoracic aneurysm with Dr. Nevada Crane. Take copy of your last CT scan provided today to your upcoming appointment with Dr. Nevada Crane.  3. I will check to see if you were vaccinated for Hepatitis A and B. If not, we will make arrangements.  4. Return to the office in 3 months or sooner if needed.

## 2017-10-01 ENCOUNTER — Telehealth: Payer: Self-pay

## 2017-10-01 NOTE — Telephone Encounter (Signed)
T/C from [place] at Dr. Juel Burrow, they do not have any documentation of pt having the Hep A and B vaccines.

## 2017-10-01 NOTE — Telephone Encounter (Signed)
Pt though he had his Hepatitis A and B vaccinations at [facility]. I called and they do no give vaccines. So pt said his PCP might know.  I have faxed Dr. Delphina Cahill requesting the info.

## 2017-10-02 NOTE — Telephone Encounter (Signed)
Noted.   Patient will be getting u/s in 01/2018. Let's get his next labs at that time. Will need the following.   CMET, PT/INR, CBC, AFP tumor marker, Hep B surface antibody, Hep A total antibody.

## 2017-10-04 ENCOUNTER — Other Ambulatory Visit: Payer: Self-pay

## 2017-10-04 DIAGNOSIS — R7989 Other specified abnormal findings of blood chemistry: Secondary | ICD-10-CM

## 2017-10-04 DIAGNOSIS — R932 Abnormal findings on diagnostic imaging of liver and biliary tract: Secondary | ICD-10-CM

## 2017-10-04 DIAGNOSIS — R945 Abnormal results of liver function studies: Secondary | ICD-10-CM

## 2017-10-04 NOTE — Telephone Encounter (Signed)
Labs on file for July 2019.

## 2017-10-25 DIAGNOSIS — N183 Chronic kidney disease, stage 3 (moderate): Secondary | ICD-10-CM | POA: Diagnosis not present

## 2017-10-25 DIAGNOSIS — D509 Iron deficiency anemia, unspecified: Secondary | ICD-10-CM | POA: Diagnosis not present

## 2017-10-25 DIAGNOSIS — Z0001 Encounter for general adult medical examination with abnormal findings: Secondary | ICD-10-CM | POA: Diagnosis not present

## 2017-10-25 DIAGNOSIS — Z86010 Personal history of colonic polyps: Secondary | ICD-10-CM | POA: Diagnosis not present

## 2017-10-25 DIAGNOSIS — E1140 Type 2 diabetes mellitus with diabetic neuropathy, unspecified: Secondary | ICD-10-CM | POA: Diagnosis not present

## 2017-10-25 DIAGNOSIS — Z6826 Body mass index (BMI) 26.0-26.9, adult: Secondary | ICD-10-CM | POA: Diagnosis not present

## 2017-10-25 DIAGNOSIS — E782 Mixed hyperlipidemia: Secondary | ICD-10-CM | POA: Diagnosis not present

## 2017-10-28 DIAGNOSIS — D509 Iron deficiency anemia, unspecified: Secondary | ICD-10-CM | POA: Diagnosis not present

## 2017-10-28 DIAGNOSIS — E1122 Type 2 diabetes mellitus with diabetic chronic kidney disease: Secondary | ICD-10-CM | POA: Diagnosis not present

## 2017-10-28 DIAGNOSIS — I10 Essential (primary) hypertension: Secondary | ICD-10-CM | POA: Diagnosis not present

## 2017-10-28 DIAGNOSIS — Z86010 Personal history of colonic polyps: Secondary | ICD-10-CM | POA: Diagnosis not present

## 2017-10-28 DIAGNOSIS — N183 Chronic kidney disease, stage 3 (moderate): Secondary | ICD-10-CM | POA: Diagnosis not present

## 2017-10-28 DIAGNOSIS — E782 Mixed hyperlipidemia: Secondary | ICD-10-CM | POA: Diagnosis not present

## 2017-10-28 DIAGNOSIS — Z6826 Body mass index (BMI) 26.0-26.9, adult: Secondary | ICD-10-CM | POA: Diagnosis not present

## 2017-11-30 IMAGING — CT CT CHEST W/O CM
2 of 3 series · 15 of 36 positions shown, 18 images · non-contrast
Comparison: Chest radiograph performed 05/25/2008, and CT of the
chest performed 02/04/2008

CLINICAL DATA: Abnormal weight loss of 55 pounds over the past 8
months. Initial encounter.

EXAM:
CT CHEST WITHOUT CONTRAST
TECHNIQUE: Multidetector CT imaging of the chest was performed following the
standard protocol without IV contrast.

[Series 2: thorax · axial · 0.79mm/px · z∈[-278,+20]mm · 12 of 177 slices shown, 15 images]
[im 14/177  mediastinal]
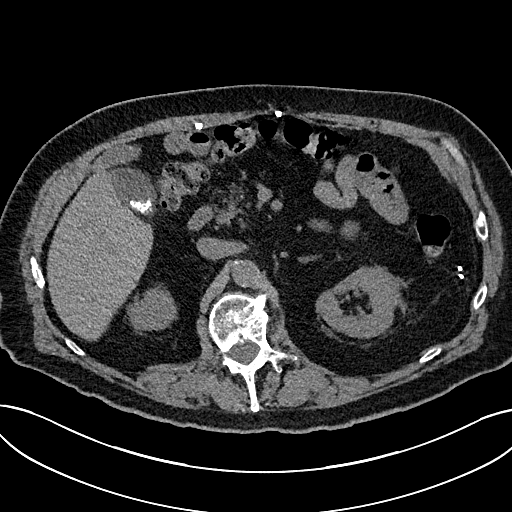
[im 14/177  lung]
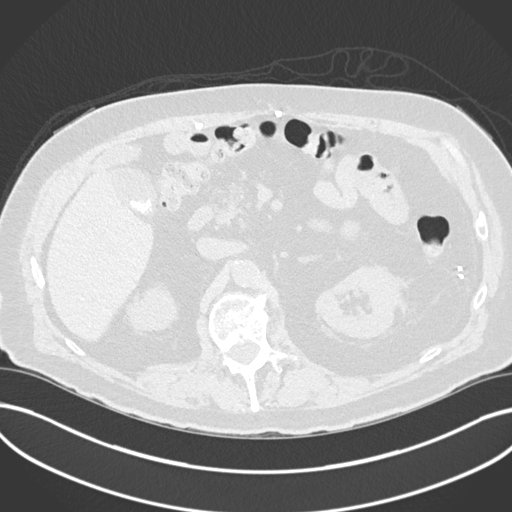
[im 27/177  lung]
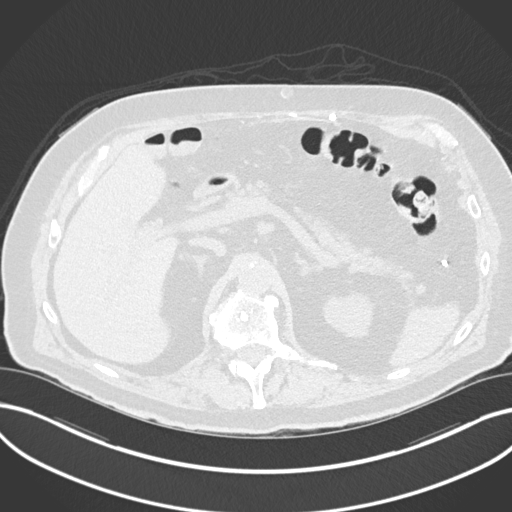
[im 40/177  lung]
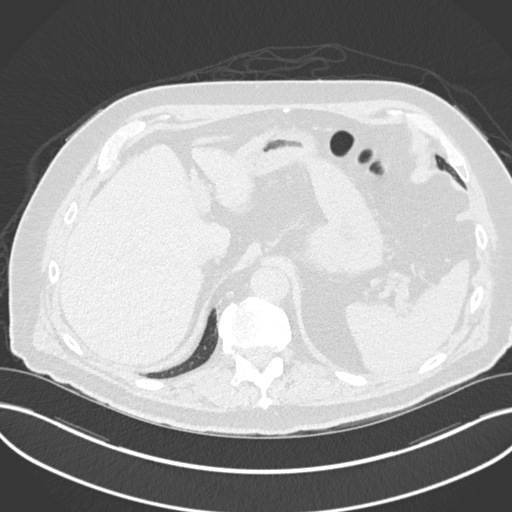
[im 53/177  lung]
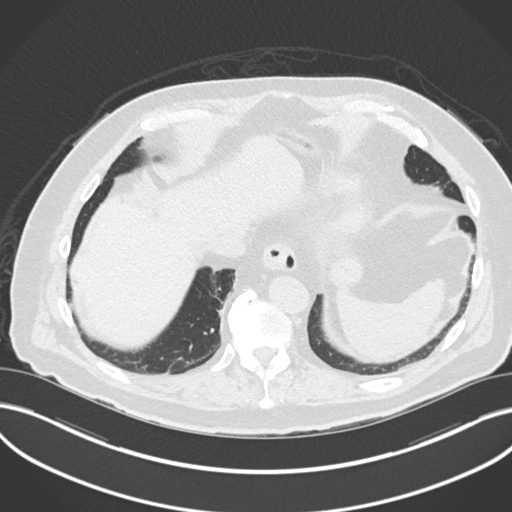
[im 66/177  mediastinal]
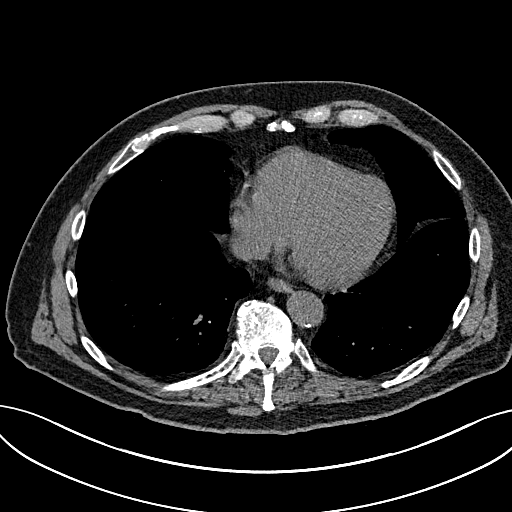
[im 66/177  lung]
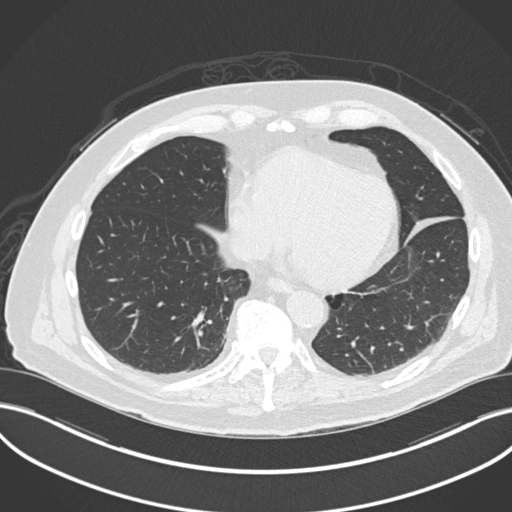
[im 79/177  lung]
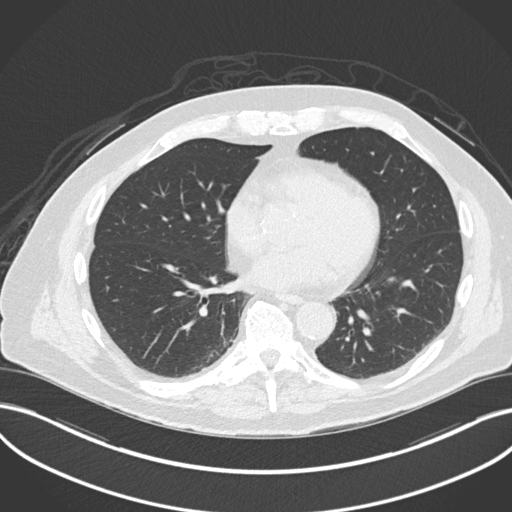
[im 98/177  lung]
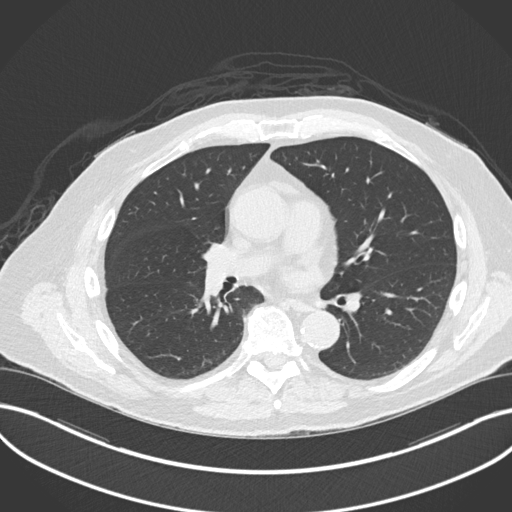
[im 111/177  lung]
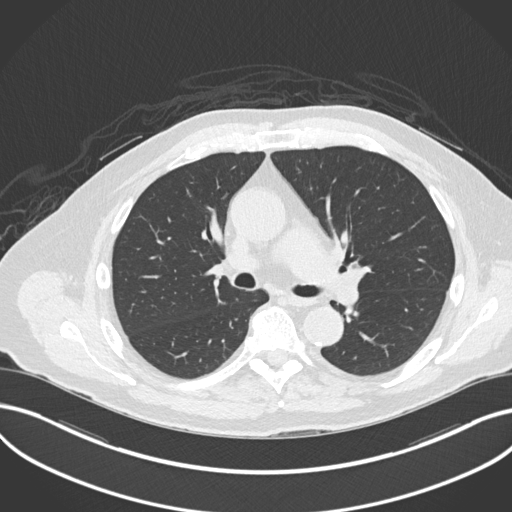
[im 124/177  mediastinal]
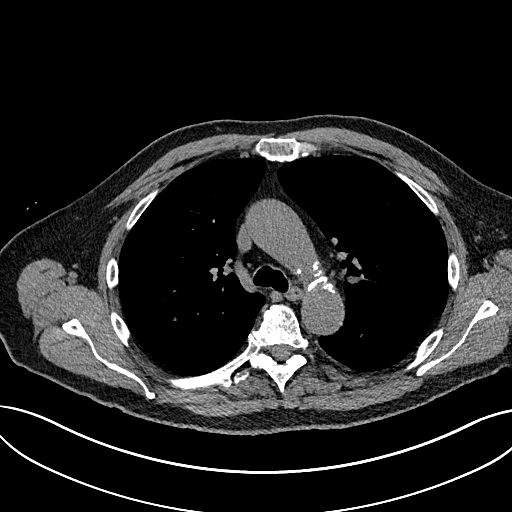
[im 124/177  lung]
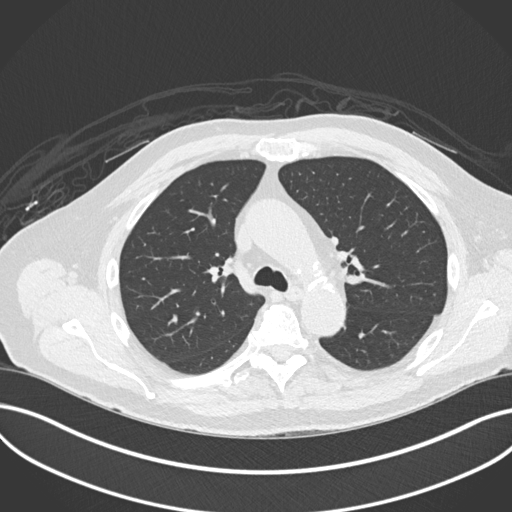
[im 137/177  lung]
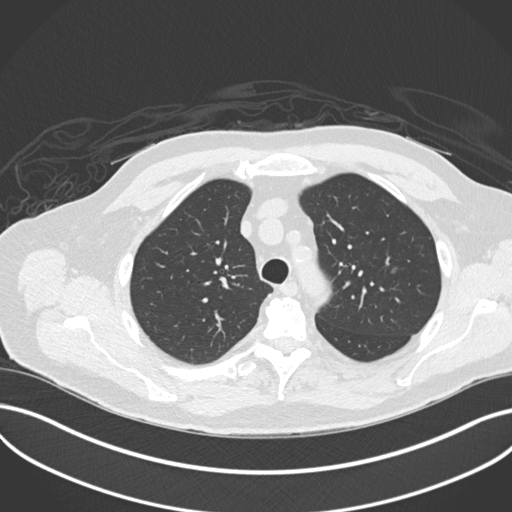
[im 150/177  lung]
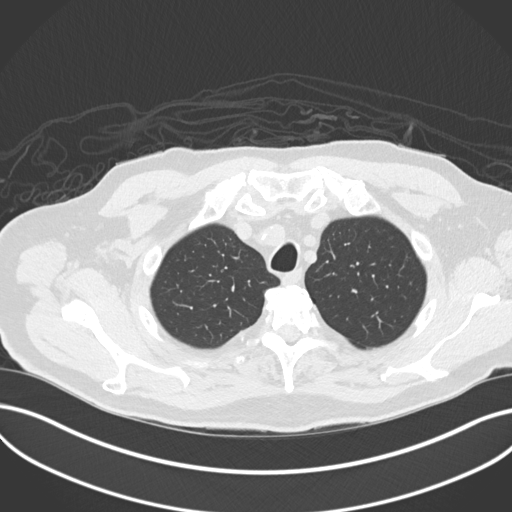
[im 163/177  lung]
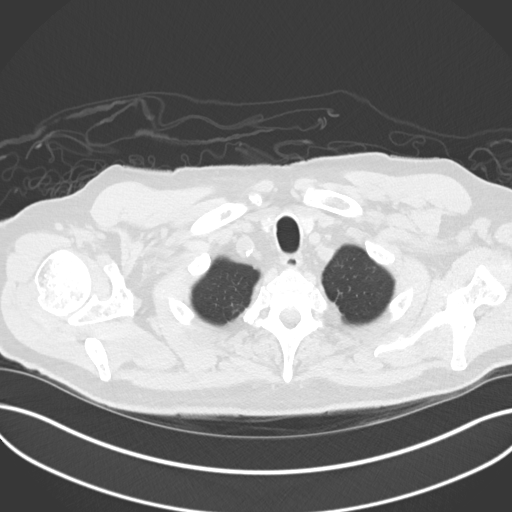

[Series 5: coronal · coronal · 0.69mm/px · 3 of 148 slices shown]
[im 30/148  lung]
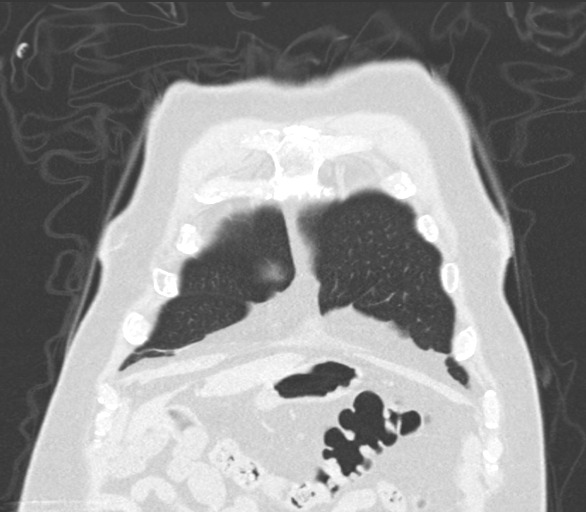
[im 59/148  lung]
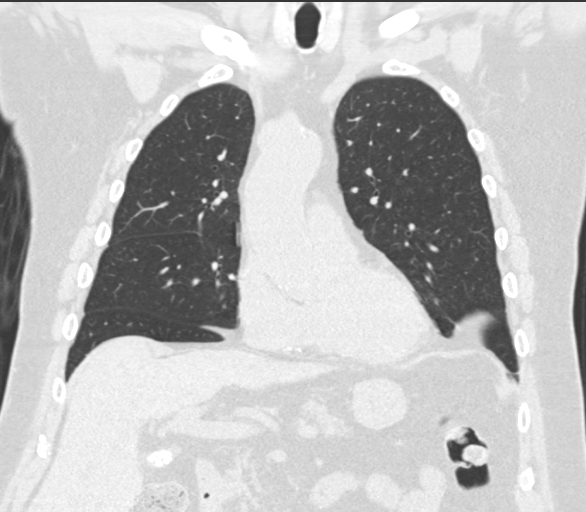
[im 89/148  lung]
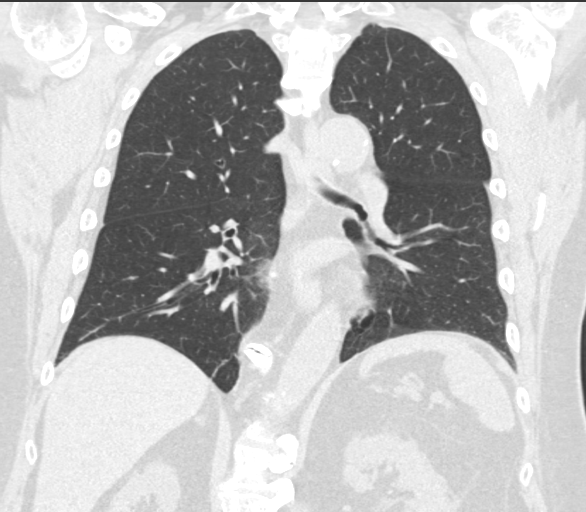

[15 of 36 positions shown; findings below may reference images not displayed]

FINDINGS: Cardiovascular: There is mild aneurysmal dilatation of the ascending
thoracic aorta to 4.2 cm in AP dimension. The heart remains normal
in size. Diffuse coronary artery calcifications are seen. Scattered
calcification is noted along the aortic arch and descending thoracic
aorta. The great vessels are grossly unremarkable in appearance.

Mediastinum/Nodes: No mediastinal lymphadenopathy is seen. No
pericardial effusion is identified. The thyroid gland demonstrates
scattered calcifications but is otherwise unremarkable. No axillary
lymphadenopathy is seen.

Lungs/Pleura: Minimal bibasilar atelectasis is noted. No pleural
effusion or pneumothorax is seen. No masses are identified.

Upper Abdomen: The nodular contour of the liver raises concern for
mild hepatic cirrhosis. The spleen is unremarkable in appearance.
Stones are noted dependently within the gallbladder. The gallbladder
is otherwise grossly unremarkable.

The visualized portions of the pancreas, left adrenal gland and
kidneys are grossly unremarkable. Nonspecific perinephric stranding
is noted bilaterally. A 1.3 cm fat containing right adrenal adenoma
is noted. An anterior abdominal wall mesh is noted at the upper
abdomen.

Musculoskeletal: No acute osseous abnormalities are identified. The
visualized musculature is unremarkable in appearance.
IMPRESSION: 1. No mass is seen within the chest.
2. Mild aneurysmal dilatation of the ascending thoracic aorta to
cm in AP dimension. Recommend annual imaging followup by CTA or MRA.
This recommendation follows 3686
ACCF/AHA/AATS/ACR/ASA/SCA/PADAM/YOSHI/KLEVER/PAULUS N Guidelines for the
Diagnosis and Management of Patients with Thoracic Aortic Disease.
Circulation. 3686; 121: [id number]
3. Diffuse coronary artery calcifications seen.
4. Nodular contour of the liver raises question for mild hepatic
cirrhosis. Would correlate with LFTs.
5. Cholelithiasis.  Gallbladder otherwise grossly unremarkable.
6. 1.3 cm benign right adrenal adenoma incidentally noted.

## 2017-12-17 ENCOUNTER — Encounter: Payer: Self-pay | Admitting: *Deleted

## 2017-12-17 ENCOUNTER — Other Ambulatory Visit: Payer: Self-pay | Admitting: *Deleted

## 2017-12-17 DIAGNOSIS — R7989 Other specified abnormal findings of blood chemistry: Secondary | ICD-10-CM

## 2017-12-17 DIAGNOSIS — R945 Abnormal results of liver function studies: Secondary | ICD-10-CM

## 2017-12-17 DIAGNOSIS — R932 Abnormal findings on diagnostic imaging of liver and biliary tract: Secondary | ICD-10-CM

## 2017-12-20 ENCOUNTER — Telehealth: Payer: Self-pay | Admitting: Internal Medicine

## 2017-12-20 NOTE — Telephone Encounter (Signed)
Letter sent for recall

## 2017-12-20 NOTE — Telephone Encounter (Signed)
RECALL FOR ULTRASOUND 

## 2017-12-31 ENCOUNTER — Telehealth: Payer: Self-pay

## 2017-12-31 ENCOUNTER — Ambulatory Visit (INDEPENDENT_AMBULATORY_CARE_PROVIDER_SITE_OTHER): Payer: Medicare Other | Admitting: Gastroenterology

## 2017-12-31 ENCOUNTER — Encounter: Payer: Self-pay | Admitting: Gastroenterology

## 2017-12-31 ENCOUNTER — Encounter: Payer: Self-pay | Admitting: *Deleted

## 2017-12-31 ENCOUNTER — Encounter: Payer: Self-pay | Admitting: Internal Medicine

## 2017-12-31 VITALS — BP 115/71 | HR 72 | Temp 97.4°F | Ht 74.0 in | Wt 203.0 lb

## 2017-12-31 DIAGNOSIS — I712 Thoracic aortic aneurysm, without rupture, unspecified: Secondary | ICD-10-CM

## 2017-12-31 DIAGNOSIS — R634 Abnormal weight loss: Secondary | ICD-10-CM | POA: Diagnosis not present

## 2017-12-31 DIAGNOSIS — R932 Abnormal findings on diagnostic imaging of liver and biliary tract: Secondary | ICD-10-CM

## 2017-12-31 NOTE — Assessment & Plan Note (Addendum)
Compensated cirrhosis.  Due for right upper quadrant ultrasound and labs as outlined.  We were never able to affirm that he received hepatitis A and B vaccines therefore we will check serologies.  Return to the office in 4 months.  Consider screening EGD for esophageal varices within the next 12 months.

## 2017-12-31 NOTE — Assessment & Plan Note (Signed)
Needs yearly reimaging via CTA or MRA. We have previously requested PCP to follow but patient reports no additional imaging studies performed. We will touch based with PCP to see if they will make arrangements for yearly imaging. Otherwise, we will send to cardiovascular for management.

## 2017-12-31 NOTE — Patient Instructions (Signed)
1. Please have your ultrasound and labs done as recommended.  2. We will touch base with Dr. Juel Burrow office to see if they would follow your aneurysm, you should be having yearly CT or MRI of the chest to follow this. 3. We will see you back in four months.

## 2017-12-31 NOTE — Progress Notes (Signed)
Primary Care Physician: Celene Squibb, MD  Primary Gastroenterologist:  Garfield Cornea, MD   Chief Complaint  Patient presents with  . Weight Loss    f/u; gained 10 lbs since last OV    HPI: Mark Le is a 83 y.o. male here for follow-up of weight loss.  His weight is actually up 10 pounds in the past 3 months.  He has a history of weight loss/poor appetite, nodular liver on CT imaging.  His last ultrasound for hepatoma surveillance was in January 2019, he is due next month in addition with his labs as well.    Since his last ov, he has been trying to eat more. He is somewhat surprised he has gained weight. His feels good. No abd pain. Occasional constipation. No melena, brbpr. No heartburn.   He has ascending thoracic aortic.  Recommended annual imaging by CTA or MRA this is not been done.  We have requested that he follow-up with his PCP for this on several occasions however according to the patient to his knowledge it has not been addressed.     Prior history details  History of multiple adenomatous colon polyps, 1 growing out of the appendiceal orifice. Pathology revealed tubulovillous adenoma. Entire polyp could not be removedon second TCS inJune. Appendectomy recommended and he had this on 04/26/17. Path showed tubulovillous adenoma and no high grade dysplasia.   Patient had a CT abdomen and pelvis without contrast (dye allergy)10/2017with evidence of gallstones, enlarging right kidney lesion, aortoiliac arthrosclerosis. Follow-up MRI abdomen with and without contrast showed benign cyst in the kidneys, no evidence of renal neoplasm.  CT chest without contrast for unintentional weight loss 08/2016. He had mild aneurysm dilation of the ascending thoracic aorta measuring 4.2 cm. 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 Stones noted within the gallbladder. Otherwise gallbladder appeared unremarkable.  Patient  encouraged to discuss follow up of thoracic aneurysm with Dr. Nevada Crane at last OV, copy of Chest CT provided.    Current Outpatient Medications  Medication Sig Dispense Refill  . amLODipine (NORVASC) 5 MG tablet Take 5 mg by mouth daily. Patient takes at 400pm daily    . aspirin 81 MG tablet Take 81 mg by mouth daily.     . carvedilol (COREG) 12.5 MG tablet Take 12.5 mg by mouth 2 (two) times daily with a meal.      . fenofibrate (TRICOR) 145 MG tablet Take 72.5 mg by mouth daily. Takes 1/2 tablet daily    . lisinopril (PRINIVIL,ZESTRIL) 40 MG tablet Take 20 mg by mouth daily.     . Multiple Vitamins-Minerals (ONE-A-DAY MENS 50+ ADVANTAGE) TABS Take 1 tablet by mouth daily.    . simvastatin (ZOCOR) 20 MG tablet Take 20 mg by mouth at bedtime.      No current facility-administered medications for this visit.     Allergies as of 12/31/2017 - Review Complete 12/31/2017  Allergen Reaction Noted  . Iohexol Hives 05/21/2007    ROS:  General: Negative for anorexia, weight loss, fever, chills, fatigue, weakness. ENT: Negative for hoarseness, difficulty swallowing , nasal congestion. CV: Negative for chest pain, angina, palpitations, dyspnea on exertion, peripheral edema.  Respiratory: Negative for dyspnea at rest, dyspnea on exertion, cough, sputum, wheezing.  GI: See history of present illness. GU:  Negative for dysuria, hematuria, urinary incontinence, urinary frequency, nocturnal urination.  Endo: Negative for unusual weight change.    Physical Examination:  BP 115/71   Pulse 72   Temp (!) 97.4 F (36.3 C) (Oral)   Ht 6\' 2"  (1.88 m)   Wt 203 lb (92.1 kg)   BMI 26.06 kg/m   General: Well-nourished, well-developed in no acute distress. Somewhat disheveled in appearance but at baseline.  Eyes: No icterus. Mouth: Oropharyngeal mucosa moist and pink , no lesions erythema or exudate. Lungs: Clear to auscultation bilaterally.  Heart: Regular rate and rhythm, no murmurs rubs or gallops.   Abdomen: Bowel sounds are normal, nontender, nondistended, no hepatosplenomegaly or masses, no abdominal bruits or hernia , no rebound or guarding.   Extremities: No lower extremity edema. No clubbing or deformities. Neuro: Alert and oriented x 4   Skin: Warm and dry, no jaundice.   Psych: Alert and cooperative, normal mood and affect.  Imaging Studies: No results found.

## 2017-12-31 NOTE — Progress Notes (Signed)
CC'D TO PCP °

## 2017-12-31 NOTE — Assessment & Plan Note (Signed)
Stable.  Has gained 10 pounds.  Previously declined nutritional consult.  Encouraged adequate oral intake. Recheck in office in four months.

## 2017-12-31 NOTE — Telephone Encounter (Signed)
Mark Le,   I have faxed a letter to Dr. Nevada Crane along with a letter in reference to the CT report of 08/2016 and the aneurysm.  I asked for confirmation from them that they have received it.

## 2017-12-31 NOTE — Telephone Encounter (Signed)
I called Dr. Juel Burrow had to leave a VM. I asked for a return call to confirm that they received my fax this afternoon.

## 2018-01-01 NOTE — Telephone Encounter (Signed)
I received the fax back this morning confirming it was received by Dr. Juel Burrow office.

## 2018-01-17 DIAGNOSIS — R945 Abnormal results of liver function studies: Secondary | ICD-10-CM | POA: Diagnosis not present

## 2018-01-17 DIAGNOSIS — R932 Abnormal findings on diagnostic imaging of liver and biliary tract: Secondary | ICD-10-CM | POA: Diagnosis not present

## 2018-01-20 LAB — CBC WITH DIFFERENTIAL/PLATELET
BASOS PCT: 0.9 %
Basophils Absolute: 30 cells/uL (ref 0–200)
EOS PCT: 5.1 %
Eosinophils Absolute: 168 cells/uL (ref 15–500)
HCT: 35.6 % — ABNORMAL LOW (ref 38.5–50.0)
HEMOGLOBIN: 12.1 g/dL — AB (ref 13.2–17.1)
Lymphs Abs: 1056 cells/uL (ref 850–3900)
MCH: 31.9 pg (ref 27.0–33.0)
MCHC: 34 g/dL (ref 32.0–36.0)
MCV: 93.9 fL (ref 80.0–100.0)
MONOS PCT: 11.1 %
MPV: 10.1 fL (ref 7.5–12.5)
Neutro Abs: 1680 cells/uL (ref 1500–7800)
Neutrophils Relative %: 50.9 %
PLATELETS: 228 10*3/uL (ref 140–400)
RBC: 3.79 10*6/uL — ABNORMAL LOW (ref 4.20–5.80)
RDW: 12.8 % (ref 11.0–15.0)
Total Lymphocyte: 32 %
WBC mixed population: 366 cells/uL (ref 200–950)
WBC: 3.3 10*3/uL — ABNORMAL LOW (ref 3.8–10.8)

## 2018-01-20 LAB — COMPREHENSIVE METABOLIC PANEL
AG RATIO: 1.7 (calc) (ref 1.0–2.5)
ALKALINE PHOSPHATASE (APISO): 30 U/L — AB (ref 40–115)
ALT: 13 U/L (ref 9–46)
AST: 19 U/L (ref 10–35)
Albumin: 3.9 g/dL (ref 3.6–5.1)
BILIRUBIN TOTAL: 0.7 mg/dL (ref 0.2–1.2)
BUN/Creatinine Ratio: 17 (calc) (ref 6–22)
BUN: 21 mg/dL (ref 7–25)
CALCIUM: 9.4 mg/dL (ref 8.6–10.3)
CO2: 28 mmol/L (ref 20–32)
Chloride: 107 mmol/L (ref 98–110)
Creat: 1.21 mg/dL — ABNORMAL HIGH (ref 0.70–1.11)
Globulin: 2.3 g/dL (calc) (ref 1.9–3.7)
Glucose, Bld: 106 mg/dL — ABNORMAL HIGH (ref 65–99)
Potassium: 4.6 mmol/L (ref 3.5–5.3)
Sodium: 142 mmol/L (ref 135–146)
Total Protein: 6.2 g/dL (ref 6.1–8.1)

## 2018-01-20 LAB — HEPATITIS A ANTIBODY, TOTAL: HEPATITIS A AB,TOTAL: NONREACTIVE

## 2018-01-20 LAB — PROTIME-INR
INR: 1.1
PROTHROMBIN TIME: 11.4 s (ref 9.0–11.5)

## 2018-01-20 LAB — AFP TUMOR MARKER: AFP TUMOR MARKER: 3 ng/mL (ref <6.1)

## 2018-01-20 LAB — HEPATITIS B SURFACE ANTIBODY,QUALITATIVE: HEP B S AB: NONREACTIVE

## 2018-01-28 ENCOUNTER — Ambulatory Visit (HOSPITAL_COMMUNITY)
Admission: RE | Admit: 2018-01-28 | Discharge: 2018-01-28 | Disposition: A | Discharge status: Home / Self Care | Payer: Medicare Other | Source: Ambulatory Visit | Attending: Gastroenterology | Admitting: Gastroenterology

## 2018-01-28 DIAGNOSIS — K8020 Calculus of gallbladder without cholecystitis without obstruction: Secondary | ICD-10-CM | POA: Diagnosis not present

## 2018-01-28 DIAGNOSIS — R932 Abnormal findings on diagnostic imaging of liver and biliary tract: Secondary | ICD-10-CM | POA: Insufficient documentation

## 2018-01-28 DIAGNOSIS — K7460 Unspecified cirrhosis of liver: Secondary | ICD-10-CM | POA: Diagnosis not present

## 2018-01-29 ENCOUNTER — Other Ambulatory Visit: Payer: Self-pay

## 2018-01-29 DIAGNOSIS — D649 Anemia, unspecified: Secondary | ICD-10-CM

## 2018-01-29 NOTE — Progress Notes (Signed)
PT is aware. His [place] was done on 01/28/2018. I am leaving the iFOBT at front for pick up and will go over the instructions with him when he picks it up. Also, Rx for Hep A and B vaccinations at front.  Lab orders on file for 04/29/2018 and OV appt is 05/05/2018.

## 2018-01-31 ENCOUNTER — Ambulatory Visit (INDEPENDENT_AMBULATORY_CARE_PROVIDER_SITE_OTHER): Payer: Medicare Other | Admitting: Nurse Practitioner

## 2018-01-31 DIAGNOSIS — D649 Anemia, unspecified: Secondary | ICD-10-CM | POA: Diagnosis not present

## 2018-01-31 LAB — IFOBT (OCCULT BLOOD): IMMUNOLOGICAL FECAL OCCULT BLOOD TEST: NEGATIVE

## 2018-02-04 DIAGNOSIS — D509 Iron deficiency anemia, unspecified: Secondary | ICD-10-CM | POA: Diagnosis not present

## 2018-02-04 DIAGNOSIS — E782 Mixed hyperlipidemia: Secondary | ICD-10-CM | POA: Diagnosis not present

## 2018-02-04 DIAGNOSIS — Z6826 Body mass index (BMI) 26.0-26.9, adult: Secondary | ICD-10-CM | POA: Diagnosis not present

## 2018-02-04 DIAGNOSIS — Z0001 Encounter for general adult medical examination with abnormal findings: Secondary | ICD-10-CM | POA: Diagnosis not present

## 2018-02-04 DIAGNOSIS — E1140 Type 2 diabetes mellitus with diabetic neuropathy, unspecified: Secondary | ICD-10-CM | POA: Diagnosis not present

## 2018-02-04 DIAGNOSIS — Z86010 Personal history of colonic polyps: Secondary | ICD-10-CM | POA: Diagnosis not present

## 2018-02-04 DIAGNOSIS — E1122 Type 2 diabetes mellitus with diabetic chronic kidney disease: Secondary | ICD-10-CM | POA: Diagnosis not present

## 2018-02-04 DIAGNOSIS — N1330 Unspecified hydronephrosis: Secondary | ICD-10-CM | POA: Diagnosis not present

## 2018-02-05 NOTE — Progress Notes (Signed)
Discussed the following with Dr. Gala Romney. See response.   Rourk, Cristopher Estimable, MD  Mahala Menghini, PA-C        I'm inclined to say leave him alone unless new symptoms develop in the future.   Previous Messages    ----- Message -----  From: Westly Pam  Sent: 01/24/2018  9:49 PM  To: Daneil Dolin, MD, Mahala Menghini, PA-C   Did you have plans for surveillance colonoscopy on this patient? He had large tubulovillous adenoma coming out of appendix, had to have appendectomy. Also with multiple other tubular adenomas. All of this was 2018.

## 2018-02-05 NOTE — Progress Notes (Signed)
ON RECALL  °

## 2018-02-06 DIAGNOSIS — D509 Iron deficiency anemia, unspecified: Secondary | ICD-10-CM | POA: Diagnosis not present

## 2018-02-06 DIAGNOSIS — R4689 Other symptoms and signs involving appearance and behavior: Secondary | ICD-10-CM | POA: Diagnosis not present

## 2018-02-06 DIAGNOSIS — Z712 Person consulting for explanation of examination or test findings: Secondary | ICD-10-CM | POA: Diagnosis not present

## 2018-02-06 DIAGNOSIS — Z86010 Personal history of colonic polyps: Secondary | ICD-10-CM | POA: Diagnosis not present

## 2018-02-06 DIAGNOSIS — E782 Mixed hyperlipidemia: Secondary | ICD-10-CM | POA: Diagnosis not present

## 2018-02-06 DIAGNOSIS — E46 Unspecified protein-calorie malnutrition: Secondary | ICD-10-CM | POA: Diagnosis not present

## 2018-02-06 DIAGNOSIS — I10 Essential (primary) hypertension: Secondary | ICD-10-CM | POA: Diagnosis not present

## 2018-02-06 DIAGNOSIS — E1140 Type 2 diabetes mellitus with diabetic neuropathy, unspecified: Secondary | ICD-10-CM | POA: Diagnosis not present

## 2018-02-06 DIAGNOSIS — N183 Chronic kidney disease, stage 3 (moderate): Secondary | ICD-10-CM | POA: Diagnosis not present

## 2018-04-01 ENCOUNTER — Other Ambulatory Visit: Payer: Self-pay

## 2018-04-01 DIAGNOSIS — D649 Anemia, unspecified: Secondary | ICD-10-CM

## 2018-04-03 DIAGNOSIS — Z23 Encounter for immunization: Secondary | ICD-10-CM | POA: Diagnosis not present

## 2018-04-29 DIAGNOSIS — D649 Anemia, unspecified: Secondary | ICD-10-CM | POA: Diagnosis not present

## 2018-04-30 LAB — CBC WITH DIFFERENTIAL/PLATELET
BASOS PCT: 0.7 %
Basophils Absolute: 30 cells/uL (ref 0–200)
EOS PCT: 2.3 %
Eosinophils Absolute: 99 cells/uL (ref 15–500)
HEMATOCRIT: 37.4 % — AB (ref 38.5–50.0)
Hemoglobin: 13 g/dL — ABNORMAL LOW (ref 13.2–17.1)
LYMPHS ABS: 1251 {cells}/uL (ref 850–3900)
MCH: 32.4 pg (ref 27.0–33.0)
MCHC: 34.8 g/dL (ref 32.0–36.0)
MCV: 93.3 fL (ref 80.0–100.0)
MPV: 10 fL (ref 7.5–12.5)
Monocytes Relative: 8.9 %
NEUTROS PCT: 59 %
Neutro Abs: 2537 cells/uL (ref 1500–7800)
PLATELETS: 248 10*3/uL (ref 140–400)
RBC: 4.01 10*6/uL — AB (ref 4.20–5.80)
RDW: 12.7 % (ref 11.0–15.0)
Total Lymphocyte: 29.1 %
WBC mixed population: 383 cells/uL (ref 200–950)
WBC: 4.3 10*3/uL (ref 3.8–10.8)

## 2018-04-30 LAB — IRON,TIBC AND FERRITIN PANEL
%SAT: 42 % (calc) (ref 20–48)
Ferritin: 98 ng/mL (ref 24–380)
IRON: 150 ug/dL (ref 50–180)
TIBC: 354 ug/dL (ref 250–425)

## 2018-05-05 ENCOUNTER — Encounter: Payer: Self-pay | Admitting: Gastroenterology

## 2018-05-05 ENCOUNTER — Other Ambulatory Visit: Payer: Self-pay

## 2018-05-05 ENCOUNTER — Ambulatory Visit (INDEPENDENT_AMBULATORY_CARE_PROVIDER_SITE_OTHER): Payer: Medicare Other | Admitting: Gastroenterology

## 2018-05-05 VITALS — BP 109/59 | HR 72 | Temp 97.0°F | Ht 74.0 in | Wt 199.4 lb

## 2018-05-05 DIAGNOSIS — K7460 Unspecified cirrhosis of liver: Secondary | ICD-10-CM | POA: Diagnosis not present

## 2018-05-05 DIAGNOSIS — R188 Other ascites: Secondary | ICD-10-CM | POA: Diagnosis not present

## 2018-05-05 DIAGNOSIS — R932 Abnormal findings on diagnostic imaging of liver and biliary tract: Secondary | ICD-10-CM | POA: Diagnosis not present

## 2018-05-05 NOTE — Patient Instructions (Signed)
1. Please plan on labs and ultrasound in 07/2018. We will send you reminder when time.  2. Return to the office in six months to see Dr. Gala Romney. Call sooner if any problems.

## 2018-05-05 NOTE — Progress Notes (Signed)
Primary Care Physician: Celene Squibb, MD  Primary Gastroenterologist:  Garfield Cornea, MD   Chief Complaint  Patient presents with  . Follow-up    doing well     HPI: Mark Le is a 83 y.o. male here for follow-up of cirrhosis and weight loss.  He was last seen in June.  He states he feels well.  He eats one really good meal day and the snacks throughout the day.  Denies any abdominal pain.  No melena or rectal bleeding.  Bowel function is normal.  No swelling or itching.  No confusion.  Right upper quadrant ultrasound done in July with no evidence of hepatoma.  He has cholelithiasis.  He had CBC 10 days ago, hemoglobin improved up to 13 near normal, no evidence of iron deficiency anemia.  Suspected mild anemia due to chronic disease.  He was heme negative in July.  Meld 9 in July.  Due for labs in 3 months.   Wt Readings from Last 3 Encounters:  05/05/18 199 lb 6.4 oz (90.4 kg)  12/31/17 203 lb (92.1 kg)  09/30/17 193 lb 9.6 oz (87.8 kg)   Prior history details  History of multiple adenomatous colon polyps, 1 growing out of the appendiceal orifice. Pathology revealed tubulovillous adenoma. Entire polyp could not be removedon second TCS inJune. Appendectomy recommended and hehad this on10/12/18.Path showed tubulovillous adenoma and no high grade dysplasia.  Patient had a CT abdomen and pelvis without contrast (dye allergy)10/2017with evidence of gallstones, enlarging right kidney lesion, aortoiliac arthrosclerosis. Follow-up MRI abdomen with and without contrast showed benign cyst in the kidneys, no evidence of renal neoplasm.  CT chest without contrast for unintentional weight loss2/2018. He had mild aneurysm dilation of the ascending thoracic aorta measuring 4.2 cm. 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 Stones noted within the gallbladder. Otherwise gallbladder appeared unremarkable.  Patient  encouraged to discuss follow up of thoracic aneurysm with Dr. Nevada Crane at last OV, copy of Chest CT provided.    Current Outpatient Medications  Medication Sig Dispense Refill  . aspirin 81 MG tablet Take 81 mg by mouth daily.     . carvedilol (COREG) 12.5 MG tablet Take 12.5 mg by mouth 2 (two) times daily with a meal.      . fenofibrate (TRICOR) 145 MG tablet Take 72.5 mg by mouth daily. Takes 1/2 tablet daily    . lisinopril (PRINIVIL,ZESTRIL) 40 MG tablet Take 20 mg by mouth daily.     . Multiple Vitamins-Minerals (ONE-A-DAY MENS 50+ ADVANTAGE) TABS Take 1 tablet by mouth daily.    . simvastatin (ZOCOR) 20 MG tablet Take 20 mg by mouth at bedtime.      No current facility-administered medications for this visit.     Allergies as of 05/05/2018 - Review Complete 05/05/2018  Allergen Reaction Noted  . Iohexol Hives 05/21/2007    ROS:  General: Negative for anorexia. no recent weight loss, fever, chills, fatigue, weakness. ENT: Negative for hoarseness, difficulty swallowing , nasal congestion. CV: Negative for chest pain, angina, palpitations, dyspnea on exertion, peripheral edema.  Respiratory: Negative for dyspnea at rest, dyspnea on exertion, cough, sputum, wheezing.  GI: See history of present illness. GU:  Negative for dysuria, hematuria, urinary incontinence, urinary frequency, nocturnal urination.  Endo: Negative for unusual weight change.    Physical Examination:   BP (!) 109/59   Pulse 72   Temp (!) 97 F (36.1 C) (Oral)  Ht 6\' 2"  (1.88 m)   Wt 199 lb 6.4 oz (90.4 kg)   BMI 25.60 kg/m   General: Well-nourished, well-developed in no acute distress.  At baseline.  Somewhat disheveled appearing Eyes: No icterus. Mouth: Oropharyngeal mucosa moist and pink , no lesions erythema or exudate. Lungs: Clear to auscultation bilaterally.  Heart: Regular rate and rhythm, no murmurs rubs or gallops.  Abdomen: Bowel sounds are normal, nontender, nondistended, no  hepatosplenomegaly or masses, no abdominal bruits or hernia , no rebound or guarding.   Extremities: No lower extremity edema. No clubbing or deformities. Neuro: Alert and oriented x 4   Skin: Warm and dry, no jaundice.   Psych: Alert and cooperative, normal mood and affect.  Labs:  Lab Results  Component Value Date   CREATININE 1.21 (H) 01/17/2018   BUN 21 01/17/2018   NA 142 01/17/2018   K 4.6 01/17/2018   CL 107 01/17/2018   CO2 28 01/17/2018   Lab Results  Component Value Date   ALT 13 01/17/2018   AST 19 01/17/2018   ALKPHOS 30 01/17/2018   BILITOT 0.7 01/17/2018   Lab Results  Component Value Date   WBC 4.3 04/29/2018   HGB 13.0 (L) 04/29/2018   HCT 37.4 (L) 04/29/2018   MCV 93.3 04/29/2018   PLT 248 04/29/2018    Imaging Studies: No results found.

## 2018-05-09 NOTE — Assessment & Plan Note (Signed)
Compensated cirrhosis.  Currently up-to-date on labs and ultrasound.  His weight has stabilized, overall up 5 pounds since March.  Return to the office to see Dr. Gala Romney in 6 months.  Call sooner if needed.

## 2018-05-09 NOTE — Progress Notes (Signed)
cc'ed to pcp °

## 2018-05-12 DIAGNOSIS — N183 Chronic kidney disease, stage 3 (moderate): Secondary | ICD-10-CM | POA: Diagnosis not present

## 2018-05-12 DIAGNOSIS — I10 Essential (primary) hypertension: Secondary | ICD-10-CM | POA: Diagnosis not present

## 2018-05-12 DIAGNOSIS — Z712 Person consulting for explanation of examination or test findings: Secondary | ICD-10-CM | POA: Diagnosis not present

## 2018-05-12 DIAGNOSIS — E46 Unspecified protein-calorie malnutrition: Secondary | ICD-10-CM | POA: Diagnosis not present

## 2018-05-12 DIAGNOSIS — E1140 Type 2 diabetes mellitus with diabetic neuropathy, unspecified: Secondary | ICD-10-CM | POA: Diagnosis not present

## 2018-05-12 DIAGNOSIS — D509 Iron deficiency anemia, unspecified: Secondary | ICD-10-CM | POA: Diagnosis not present

## 2018-05-12 DIAGNOSIS — E782 Mixed hyperlipidemia: Secondary | ICD-10-CM | POA: Diagnosis not present

## 2018-05-12 DIAGNOSIS — Z6826 Body mass index (BMI) 26.0-26.9, adult: Secondary | ICD-10-CM | POA: Diagnosis not present

## 2018-05-19 DIAGNOSIS — N183 Chronic kidney disease, stage 3 (moderate): Secondary | ICD-10-CM | POA: Diagnosis not present

## 2018-05-19 DIAGNOSIS — I10 Essential (primary) hypertension: Secondary | ICD-10-CM | POA: Diagnosis not present

## 2018-05-19 DIAGNOSIS — E46 Unspecified protein-calorie malnutrition: Secondary | ICD-10-CM | POA: Diagnosis not present

## 2018-05-19 DIAGNOSIS — E1122 Type 2 diabetes mellitus with diabetic chronic kidney disease: Secondary | ICD-10-CM | POA: Diagnosis not present

## 2018-05-19 DIAGNOSIS — E782 Mixed hyperlipidemia: Secondary | ICD-10-CM | POA: Diagnosis not present

## 2018-05-19 DIAGNOSIS — D509 Iron deficiency anemia, unspecified: Secondary | ICD-10-CM | POA: Diagnosis not present

## 2018-05-19 DIAGNOSIS — Z86010 Personal history of colonic polyps: Secondary | ICD-10-CM | POA: Diagnosis not present

## 2018-06-16 ENCOUNTER — Other Ambulatory Visit: Payer: Self-pay

## 2018-06-16 DIAGNOSIS — K7460 Unspecified cirrhosis of liver: Secondary | ICD-10-CM

## 2018-07-07 ENCOUNTER — Telehealth: Payer: Self-pay | Admitting: Internal Medicine

## 2018-07-07 DIAGNOSIS — K7460 Unspecified cirrhosis of liver: Secondary | ICD-10-CM

## 2018-07-07 NOTE — Telephone Encounter (Signed)
RECALL FOR ULTRASOUND 

## 2018-07-07 NOTE — Telephone Encounter (Signed)
Letter mailed

## 2018-07-25 DIAGNOSIS — K7460 Unspecified cirrhosis of liver: Secondary | ICD-10-CM | POA: Diagnosis not present

## 2018-07-28 LAB — COMPREHENSIVE METABOLIC PANEL
AG Ratio: 1.7 (calc) (ref 1.0–2.5)
ALT: 10 U/L (ref 9–46)
AST: 20 U/L (ref 10–35)
Albumin: 4 g/dL (ref 3.6–5.1)
Alkaline phosphatase (APISO): 30 U/L — ABNORMAL LOW (ref 40–115)
BUN/Creatinine Ratio: 13 (calc) (ref 6–22)
BUN: 19 mg/dL (ref 7–25)
CO2: 29 mmol/L (ref 20–32)
Calcium: 9.4 mg/dL (ref 8.6–10.3)
Chloride: 106 mmol/L (ref 98–110)
Creat: 1.43 mg/dL — ABNORMAL HIGH (ref 0.70–1.11)
Globulin: 2.4 g/dL (calc) (ref 1.9–3.7)
Glucose, Bld: 107 mg/dL — ABNORMAL HIGH (ref 65–99)
Potassium: 4.2 mmol/L (ref 3.5–5.3)
Sodium: 142 mmol/L (ref 135–146)
Total Bilirubin: 0.7 mg/dL (ref 0.2–1.2)
Total Protein: 6.4 g/dL (ref 6.1–8.1)

## 2018-07-28 LAB — CBC WITH DIFFERENTIAL/PLATELET
Absolute Monocytes: 402 cells/uL (ref 200–950)
Basophils Absolute: 20 cells/uL (ref 0–200)
Basophils Relative: 0.5 %
EOS ABS: 172 {cells}/uL (ref 15–500)
Eosinophils Relative: 4.4 %
HEMATOCRIT: 36.2 % — AB (ref 38.5–50.0)
Hemoglobin: 12.6 g/dL — ABNORMAL LOW (ref 13.2–17.1)
LYMPHS ABS: 1466 {cells}/uL (ref 850–3900)
MCH: 32.6 pg (ref 27.0–33.0)
MCHC: 34.8 g/dL (ref 32.0–36.0)
MCV: 93.5 fL (ref 80.0–100.0)
MPV: 10.2 fL (ref 7.5–12.5)
Monocytes Relative: 10.3 %
Neutro Abs: 1841 cells/uL (ref 1500–7800)
Neutrophils Relative %: 47.2 %
PLATELETS: 253 10*3/uL (ref 140–400)
RBC: 3.87 10*6/uL — ABNORMAL LOW (ref 4.20–5.80)
RDW: 12.8 % (ref 11.0–15.0)
Total Lymphocyte: 37.6 %
WBC: 3.9 10*3/uL (ref 3.8–10.8)

## 2018-07-28 LAB — PROTIME-INR
INR: 1.1
Prothrombin Time: 11.2 s (ref 9.0–11.5)

## 2018-07-28 LAB — AFP TUMOR MARKER: AFP TUMOR MARKER: 2.6 ng/mL (ref <6.1)

## 2018-07-31 NOTE — Telephone Encounter (Signed)
Patient stopped by the office and is scheduled for 08/05/2018 at 7:30am, arrival time 7:15am. NPO after midnight. Letter provided to pt

## 2018-07-31 NOTE — Addendum Note (Signed)
Addended by: Inge Rise on: 07/31/2018 09:44 AM   Modules accepted: Orders

## 2018-08-05 ENCOUNTER — Other Ambulatory Visit: Payer: Self-pay

## 2018-08-05 ENCOUNTER — Ambulatory Visit (HOSPITAL_COMMUNITY)
Admission: RE | Admit: 2018-08-05 | Discharge: 2018-08-05 | Disposition: A | Discharge status: Home / Self Care | Payer: Medicare Other | Source: Ambulatory Visit | Attending: Gastroenterology | Admitting: Gastroenterology

## 2018-08-05 DIAGNOSIS — K7460 Unspecified cirrhosis of liver: Secondary | ICD-10-CM

## 2018-08-05 DIAGNOSIS — R188 Other ascites: Principal | ICD-10-CM

## 2018-08-05 DIAGNOSIS — K8020 Calculus of gallbladder without cholecystitis without obstruction: Secondary | ICD-10-CM | POA: Diagnosis not present

## 2018-08-06 ENCOUNTER — Encounter: Payer: Self-pay | Admitting: Internal Medicine

## 2018-09-18 DIAGNOSIS — E1122 Type 2 diabetes mellitus with diabetic chronic kidney disease: Secondary | ICD-10-CM | POA: Diagnosis not present

## 2018-09-18 DIAGNOSIS — E1140 Type 2 diabetes mellitus with diabetic neuropathy, unspecified: Secondary | ICD-10-CM | POA: Diagnosis not present

## 2018-09-18 DIAGNOSIS — D509 Iron deficiency anemia, unspecified: Secondary | ICD-10-CM | POA: Diagnosis not present

## 2018-09-18 DIAGNOSIS — E782 Mixed hyperlipidemia: Secondary | ICD-10-CM | POA: Diagnosis not present

## 2018-09-18 DIAGNOSIS — N183 Chronic kidney disease, stage 3 (moderate): Secondary | ICD-10-CM | POA: Diagnosis not present

## 2018-09-18 DIAGNOSIS — I10 Essential (primary) hypertension: Secondary | ICD-10-CM | POA: Diagnosis not present

## 2018-09-30 DIAGNOSIS — I10 Essential (primary) hypertension: Secondary | ICD-10-CM | POA: Diagnosis not present

## 2018-09-30 DIAGNOSIS — E782 Mixed hyperlipidemia: Secondary | ICD-10-CM | POA: Diagnosis not present

## 2018-09-30 DIAGNOSIS — D649 Anemia, unspecified: Secondary | ICD-10-CM | POA: Diagnosis not present

## 2018-09-30 DIAGNOSIS — E441 Mild protein-calorie malnutrition: Secondary | ICD-10-CM | POA: Diagnosis not present

## 2018-09-30 DIAGNOSIS — E1022 Type 1 diabetes mellitus with diabetic chronic kidney disease: Secondary | ICD-10-CM | POA: Diagnosis not present

## 2018-09-30 DIAGNOSIS — Z0001 Encounter for general adult medical examination with abnormal findings: Secondary | ICD-10-CM | POA: Diagnosis not present

## 2018-09-30 DIAGNOSIS — R460 Very low level of personal hygiene: Secondary | ICD-10-CM | POA: Diagnosis not present

## 2018-09-30 DIAGNOSIS — N183 Chronic kidney disease, stage 3 (moderate): Secondary | ICD-10-CM | POA: Diagnosis not present

## 2018-09-30 DIAGNOSIS — Z86010 Personal history of colonic polyps: Secondary | ICD-10-CM | POA: Diagnosis not present

## 2018-10-21 ENCOUNTER — Ambulatory Visit (INDEPENDENT_AMBULATORY_CARE_PROVIDER_SITE_OTHER): Payer: Medicare Other | Admitting: Internal Medicine

## 2018-10-21 ENCOUNTER — Other Ambulatory Visit: Payer: Self-pay

## 2018-10-21 ENCOUNTER — Encounter: Payer: Self-pay | Admitting: Internal Medicine

## 2018-10-21 VITALS — BP 138/79 | HR 109 | Temp 97.2°F | Ht 74.0 in | Wt 190.8 lb

## 2018-10-21 DIAGNOSIS — R634 Abnormal weight loss: Secondary | ICD-10-CM

## 2018-10-21 DIAGNOSIS — K7469 Other cirrhosis of liver: Secondary | ICD-10-CM | POA: Diagnosis not present

## 2018-10-21 NOTE — Progress Notes (Signed)
Primary Care Physician:  Celene Squibb, MD Primary Gastroenterologist:  Dr. Gala Romney  Pre-Procedure History & Physical: HPI:  Mark Le is a 84 y.o. male here for follow-up of weight loss and well compensated cirrhosis-likely EtOH in origin (heavy alcohol use for decades along with marijuana until he stopped both in 1982).  He was 190 pounds today.  It appears that he is lost about the good 90 pounds slowly over a decade or more.  Lives by himself.  Review of his dietary habits is likely inadequate dietary intake.  Nodular liver on imaging.  Albumin, platelet count, INR all good.  No varices on EGD 3 years ago.  Most recent ultrasound negative for hepatoma;  he does have cholelithiasis.  Thoracic aneurysm followed by [facility]. Past Medical History:  Diagnosis Date  . Aneurysm, thoracic aortic ([facility]) 08/2016   4.2cm  . Colon cancer ([facility]) 2002   T3 N2 tumor with 2 of 4 positive lymph nodes, s/p resection and colostomy and post-op chemotherapy. reversal of colostomy 2003.   Marland Kitchen Essential hypertension   . Gout 06/04/2011  . Hyperlipidemia   . Type 2 diabetes mellitus (HCC)    not on meds   . Ventral hernia 07/24/2011    Past Surgical History:  Procedure Laterality Date  . CATARACT EXTRACTION W/PHACO Left 09/11/2016   Procedure: CATARACT EXTRACTION PHACO AND INTRAOCULAR LENS PLACEMENT (IOC);  Surgeon: Rutherford Guys, MD;  Location: [facility];  Service: Ophthalmology;  Laterality: Left;  CDE: 19.23  . CATARACT EXTRACTION W/PHACO Right 10/09/2016   Procedure: CATARACT EXTRACTION PHACO AND INTRAOCULAR LENS PLACEMENT (IOC);  Surgeon: Rutherford Guys, MD;  Location: [facility];  Service: Ophthalmology;  Laterality: Right;  CDE: 8.08  . COLONOSCOPY  11/2002   Dr. Alphonsa Overall: anastomosis at 45cm, otherwise unremarkable.  . COLONOSCOPY  05/2006   Dr. Alphonsa Overall: 4 polyps removed.  . COLONOSCOPY  07/2009   Dr. Alphonsa Overall: 2 tubular adenomas, next TCS five years.   . COLONOSCOPY N/A 06/20/2016    Dr. Gala Romney multiple colonic polyps removed, several in piecemeal fashion, a ascending colon polyp tubular adenoma with focal high-grade dysplasia, cecal polyps tubulovillous adenoma. Next colonoscopy June 2018  . COLONOSCOPY N/A 12/27/2016   Dr. Gala Romney: Internal hemorrhoids, grade 1.  3 semi-pedunculated polyps in the ascending colon, removed.  Polyp in the appendiceal orifice (tubulovillous adenoma), sessile, piecemeal removal, not complete.  8 mm polyp in the ascending colon  . COLOSTOMY REVERSAL  01/2002   Dr. Alphonsa Overall  . ESOPHAGOGASTRODUODENOSCOPY N/A 06/20/2016   Dr. Gala Romney: Large hiatal hernia, esophagus normal  . LAPAROSCOPIC APPENDECTOMY N/A 04/26/2017   Procedure: APPENDECTOMY LAPAROSCOPIC;  Surgeon: Michael Boston, MD;  Location: [facility];  Service: General;  Laterality: N/A;  . LYSIS OF ADHESION N/A 04/26/2017   Procedure: LYSIS OF ADHESION;  Surgeon: Michael Boston, MD;  Location: [facility];  Service: General;  Laterality: N/A;  . PARTIAL COLECTOMY  04/2001   Dr. Alphonsa Overall: obstructing colon carcinoma of the left colon with resection and colostomy  . VENTRAL HERNIA REPAIR  04/2005   Dr. Alphonsa Overall    Prior to Admission medications   Medication Sig Start Date End Date Taking? Authorizing Provider  aspirin 81 MG tablet Take 81 mg by mouth daily.    Yes [provider]  carvedilol (COREG) 12.5 MG tablet Take 12.5 mg by mouth 2 (two) times daily with a meal.     Yes [provider]  fenofibrate (TRICOR)  145 MG tablet Take 72.5 mg by mouth daily. Takes 1/2 tablet daily   Yes [provider]  lisinopril (PRINIVIL,ZESTRIL) 40 MG tablet Take 20 mg by mouth daily.    Yes [provider]  Multiple Vitamins-Minerals (ONE-A-DAY MENS 50+ ADVANTAGE) TABS Take 1 tablet by mouth daily.   Yes [provider]  simvastatin (ZOCOR) 20 MG tablet Take 20 mg by mouth at bedtime.    Yes [provider]    Allergies as of 10/21/2018 - Review Complete  05/05/2018  Allergen Reaction Noted  . Iohexol Hives 05/21/2007    Family History  Problem Relation Age of Onset  . Colon cancer Neg Hx     Social History   Socioeconomic History  . Marital status: Divorced    Spouse name: Not on file  . Number of children: Not on file  . Years of education: Not on file  . Highest education level: Not on file  Occupational History  . Not on file  Social Needs  . Financial resource strain: Not on file  . Food insecurity:    Worry: Not on file    Inability: Not on file  . Transportation needs:    Medical: Not on file    Non-medical: Not on file  Tobacco Use  . Smoking status: Former Smoker    Packs/day: 1.50    Years: 15.00    Pack years: 22.50    Types: Cigarettes    Last attempt to quit: 09/06/1980    Years since quitting: 38.1  . Smokeless tobacco: Never Used  Substance and Sexual Activity  . Alcohol use: No  . Drug use: No  . Sexual activity: Never    Birth control/protection: None  Lifestyle  . Physical activity:    Days per week: Not on file    Minutes per session: Not on file  . Stress: Not on file  Relationships  . Social connections:    Talks on phone: Not on file    Gets together: Not on file    Attends religious service: Not on file    Active member of club or organization: Not on file    Attends meetings of clubs or organizations: Not on file    Relationship status: Not on file  . Intimate partner violence:    Fear of current or ex partner: Not on file    Emotionally abused: Not on file    Physically abused: Not on file    Forced sexual activity: Not on file  Other Topics Concern  . Not on file  Social History Narrative  . Not on file    Review of Systems: See HPI, otherwise negative ROS  Physical Exam: BP 138/79   Pulse (!) 109   Temp (!) 97.2 F (36.2 C) (Oral)   Ht 6\' 2"  (1.88 m)   Wt 190 lb 12.8 oz (86.5 kg)   BMI 24.50 kg/m  General:   Alert,   pleasant and cooperative in NAD Neck:  Supple;  no masses or thyromegaly. No significant cervical adenopathy. Lungs:  Clear throughout to auscultation.   No wheezes, crackles, or rhonchi. No acute distress. Heart:  Regular rate and rhythm; no murmurs, clicks, rubs,  or gallops. Abdomen: Non-distended, normal bowel sounds.  Soft and nontender without appreciable mass or hepatosplenomegaly.  Pulses:  Normal pulses noted. Extremities:  Without clubbing or edema.  Impression/Plan: Well compensated cirrhosis.  Likely due to a bit distant alcohol exposure.  Hepatitis A and B screen previously  negative.  He has now been vaccinated.  He continues to do very well as far as his liver is concerned.  Slow weight loss over a decade likely more related to behavior and lack of adequate oral intake.  We talked about going to the nutritionist.  He said he might go but he is probably not going to do anything different than he is already been doing.  I do not think any further medical evaluation for weight loss is indicated at this time.   Recommendations:  I do not recommend any further GI evaluation (other than Ultrasound).  Limited future screening for hepatoma, esophageal varices given that he is doing well and his advanced age.  Strive for good nutritional intake 2,000 to 2500 cal daily  Use Ensure or Carnation instant breakfast in whole milk twice daily  Office visit in 4 months  Follow-up with Dr. Nevada Crane as needed  Plan for a repeat ultrasound of liver 6 months from now    Notice: This dictation was prepared with Dragon dictation along with smaller phrase technology. Any transcriptional errors that result from this process are unintentional and may not be corrected upon review.

## 2018-10-21 NOTE — Patient Instructions (Addendum)
I do not recommend any further GI evaluation (other than Ultrasound)  Strive for good nutritional intake 2,000 to 2500 cal daily  Use Ensure or Carnation instant breakfast in whole milk twice daily  Office visit in 4 months  Follow-up with Dr. Nevada Crane as needed  Plan for a repeat ultrasound of liver 6 months from now

## 2018-12-23 ENCOUNTER — Other Ambulatory Visit: Payer: Self-pay | Admitting: *Deleted

## 2018-12-23 ENCOUNTER — Encounter: Payer: Self-pay | Admitting: *Deleted

## 2018-12-23 DIAGNOSIS — K7460 Unspecified cirrhosis of liver: Secondary | ICD-10-CM

## 2019-01-05 DIAGNOSIS — D509 Iron deficiency anemia, unspecified: Secondary | ICD-10-CM | POA: Diagnosis not present

## 2019-01-05 DIAGNOSIS — E1140 Type 2 diabetes mellitus with diabetic neuropathy, unspecified: Secondary | ICD-10-CM | POA: Diagnosis not present

## 2019-01-05 DIAGNOSIS — I10 Essential (primary) hypertension: Secondary | ICD-10-CM | POA: Diagnosis not present

## 2019-01-05 DIAGNOSIS — E782 Mixed hyperlipidemia: Secondary | ICD-10-CM | POA: Diagnosis not present

## 2019-01-05 DIAGNOSIS — N183 Chronic kidney disease, stage 3 (moderate): Secondary | ICD-10-CM | POA: Diagnosis not present

## 2019-01-05 DIAGNOSIS — E1122 Type 2 diabetes mellitus with diabetic chronic kidney disease: Secondary | ICD-10-CM | POA: Diagnosis not present

## 2019-01-05 DIAGNOSIS — D649 Anemia, unspecified: Secondary | ICD-10-CM | POA: Diagnosis not present

## 2019-01-12 ENCOUNTER — Telehealth: Payer: Self-pay | Admitting: Internal Medicine

## 2019-01-12 DIAGNOSIS — E1122 Type 2 diabetes mellitus with diabetic chronic kidney disease: Secondary | ICD-10-CM | POA: Diagnosis not present

## 2019-01-12 DIAGNOSIS — E440 Moderate protein-calorie malnutrition: Secondary | ICD-10-CM | POA: Diagnosis not present

## 2019-01-12 DIAGNOSIS — R460 Very low level of personal hygiene: Secondary | ICD-10-CM | POA: Diagnosis not present

## 2019-01-12 DIAGNOSIS — D649 Anemia, unspecified: Secondary | ICD-10-CM | POA: Diagnosis not present

## 2019-01-12 DIAGNOSIS — Z86010 Personal history of colonic polyps: Secondary | ICD-10-CM | POA: Diagnosis not present

## 2019-01-12 DIAGNOSIS — E782 Mixed hyperlipidemia: Secondary | ICD-10-CM | POA: Diagnosis not present

## 2019-01-12 DIAGNOSIS — N183 Chronic kidney disease, stage 3 (moderate): Secondary | ICD-10-CM | POA: Diagnosis not present

## 2019-01-12 DIAGNOSIS — I10 Essential (primary) hypertension: Secondary | ICD-10-CM | POA: Diagnosis not present

## 2019-01-12 NOTE — Telephone Encounter (Signed)
RECALL FOR ULTRASOUND 

## 2019-01-12 NOTE — Telephone Encounter (Signed)
Recall sent 

## 2019-01-21 ENCOUNTER — Other Ambulatory Visit: Payer: Self-pay

## 2019-01-21 ENCOUNTER — Telehealth: Payer: Self-pay | Admitting: Internal Medicine

## 2019-01-21 ENCOUNTER — Telehealth: Payer: Self-pay

## 2019-01-21 DIAGNOSIS — K7460 Unspecified cirrhosis of liver: Secondary | ICD-10-CM

## 2019-01-21 NOTE — Telephone Encounter (Signed)
OPENED IN ERROR

## 2019-01-21 NOTE — Telephone Encounter (Signed)
Pt came by office, he received recall letter for [place] abd RUQ. [place] scheduled for 01/26/19 at 9:00am, arrive at 8:45am. NPO after midnight prior to test. Pt informed and appt letter given.

## 2019-01-26 ENCOUNTER — Ambulatory Visit (HOSPITAL_COMMUNITY)
Admission: RE | Admit: 2019-01-26 | Discharge: 2019-01-26 | Disposition: A | Discharge status: Home / Self Care | Payer: Medicare Other | Source: Ambulatory Visit | Attending: Gastroenterology | Admitting: Gastroenterology

## 2019-01-26 ENCOUNTER — Other Ambulatory Visit: Payer: Self-pay

## 2019-01-26 DIAGNOSIS — K7460 Unspecified cirrhosis of liver: Secondary | ICD-10-CM | POA: Diagnosis not present

## 2019-01-26 DIAGNOSIS — K8020 Calculus of gallbladder without cholecystitis without obstruction: Secondary | ICD-10-CM | POA: Diagnosis not present

## 2019-02-02 DIAGNOSIS — K7460 Unspecified cirrhosis of liver: Secondary | ICD-10-CM | POA: Diagnosis not present

## 2019-02-02 DIAGNOSIS — R188 Other ascites: Secondary | ICD-10-CM | POA: Diagnosis not present

## 2019-02-03 LAB — COMPREHENSIVE METABOLIC PANEL
AG Ratio: 1.3 (calc) (ref 1.0–2.5)
ALT: 10 U/L (ref 9–46)
AST: 20 U/L (ref 10–35)
Albumin: 3.7 g/dL (ref 3.6–5.1)
Alkaline phosphatase (APISO): 33 U/L — ABNORMAL LOW (ref 35–144)
BUN/Creatinine Ratio: 17 (calc) (ref 6–22)
BUN: 22 mg/dL (ref 7–25)
CO2: 32 mmol/L (ref 20–32)
Calcium: 10.2 mg/dL (ref 8.6–10.3)
Chloride: 107 mmol/L (ref 98–110)
Creat: 1.33 mg/dL — ABNORMAL HIGH (ref 0.70–1.11)
Globulin: 2.8 g/dL (calc) (ref 1.9–3.7)
Glucose, Bld: 116 mg/dL — ABNORMAL HIGH (ref 65–99)
Potassium: 4.8 mmol/L (ref 3.5–5.3)
Sodium: 143 mmol/L (ref 135–146)
Total Bilirubin: 0.8 mg/dL (ref 0.2–1.2)
Total Protein: 6.5 g/dL (ref 6.1–8.1)

## 2019-02-03 LAB — CBC WITH DIFFERENTIAL/PLATELET
Absolute Monocytes: 340 cells/uL (ref 200–950)
Basophils Absolute: 30 cells/uL (ref 0–200)
Basophils Relative: 0.8 %
Eosinophils Absolute: 130 cells/uL (ref 15–500)
Eosinophils Relative: 3.5 %
HCT: 37 % — ABNORMAL LOW (ref 38.5–50.0)
Hemoglobin: 12.7 g/dL — ABNORMAL LOW (ref 13.2–17.1)
Lymphs Abs: 1025 cells/uL (ref 850–3900)
MCH: 33.1 pg — ABNORMAL HIGH (ref 27.0–33.0)
MCHC: 34.3 g/dL (ref 32.0–36.0)
MCV: 96.4 fL (ref 80.0–100.0)
MPV: 10.2 fL (ref 7.5–12.5)
Monocytes Relative: 9.2 %
Neutro Abs: 2176 cells/uL (ref 1500–7800)
Neutrophils Relative %: 58.8 %
Platelets: 278 10*3/uL (ref 140–400)
RBC: 3.84 10*6/uL — ABNORMAL LOW (ref 4.20–5.80)
RDW: 12.6 % (ref 11.0–15.0)
Total Lymphocyte: 27.7 %
WBC: 3.7 10*3/uL — ABNORMAL LOW (ref 3.8–10.8)

## 2019-02-03 LAB — PROTIME-INR
INR: 1.1
Prothrombin Time: 11 s (ref 9.0–11.5)

## 2019-02-03 LAB — AFP TUMOR MARKER: AFP-Tumor Marker: 2.2 ng/mL (ref <6.1)

## 2019-02-11 ENCOUNTER — Other Ambulatory Visit: Payer: Self-pay

## 2019-02-11 DIAGNOSIS — K7460 Unspecified cirrhosis of liver: Secondary | ICD-10-CM

## 2019-02-13 DIAGNOSIS — E1122 Type 2 diabetes mellitus with diabetic chronic kidney disease: Secondary | ICD-10-CM | POA: Diagnosis not present

## 2019-02-13 DIAGNOSIS — I10 Essential (primary) hypertension: Secondary | ICD-10-CM | POA: Diagnosis not present

## 2019-02-13 DIAGNOSIS — E46 Unspecified protein-calorie malnutrition: Secondary | ICD-10-CM | POA: Diagnosis not present

## 2019-02-13 DIAGNOSIS — R460 Very low level of personal hygiene: Secondary | ICD-10-CM | POA: Diagnosis not present

## 2019-02-13 DIAGNOSIS — E441 Mild protein-calorie malnutrition: Secondary | ICD-10-CM | POA: Diagnosis not present

## 2019-02-13 DIAGNOSIS — E782 Mixed hyperlipidemia: Secondary | ICD-10-CM | POA: Diagnosis not present

## 2019-02-13 DIAGNOSIS — E1140 Type 2 diabetes mellitus with diabetic neuropathy, unspecified: Secondary | ICD-10-CM | POA: Diagnosis not present

## 2019-02-13 DIAGNOSIS — N183 Chronic kidney disease, stage 3 (moderate): Secondary | ICD-10-CM | POA: Diagnosis not present

## 2019-02-13 DIAGNOSIS — Z86010 Personal history of colonic polyps: Secondary | ICD-10-CM | POA: Diagnosis not present

## 2019-02-13 DIAGNOSIS — E1022 Type 1 diabetes mellitus with diabetic chronic kidney disease: Secondary | ICD-10-CM | POA: Diagnosis not present

## 2019-02-13 DIAGNOSIS — D649 Anemia, unspecified: Secondary | ICD-10-CM | POA: Diagnosis not present

## 2019-02-13 DIAGNOSIS — Z2089 Contact with and (suspected) exposure to other communicable diseases: Secondary | ICD-10-CM | POA: Diagnosis not present

## 2019-02-13 DIAGNOSIS — Z0001 Encounter for general adult medical examination with abnormal findings: Secondary | ICD-10-CM | POA: Diagnosis not present

## 2019-03-05 ENCOUNTER — Ambulatory Visit (INDEPENDENT_AMBULATORY_CARE_PROVIDER_SITE_OTHER): Payer: Medicare Other | Admitting: Gastroenterology

## 2019-03-05 ENCOUNTER — Other Ambulatory Visit: Payer: Self-pay

## 2019-03-05 ENCOUNTER — Encounter: Payer: Self-pay | Admitting: Gastroenterology

## 2019-03-05 VITALS — BP 105/63 | HR 68 | Temp 98.2°F | Ht 74.0 in | Wt 176.2 lb

## 2019-03-05 DIAGNOSIS — R634 Abnormal weight loss: Secondary | ICD-10-CM | POA: Diagnosis not present

## 2019-03-05 DIAGNOSIS — K7460 Unspecified cirrhosis of liver: Secondary | ICD-10-CM | POA: Diagnosis not present

## 2019-03-05 NOTE — Progress Notes (Signed)
Primary Care Physician: Celene Squibb, MD  Primary Gastroenterologist:  Garfield Cornea, MD   Chief Complaint  Patient presents with  . Follow-up    HPI: Mark Le is a 84 y.o. male here for follow-up.  He was last seen in April 2020.  He has a history of weight loss, well compensated cirrhosis-likely EtOH in origin (heavy alcohol use for decades along with marijuana until he stopped both in 1982).  He weighed 190 pounds at his last office visit.  Documented slow weight loss over a decade felt to be related to behavior and lack of adequate oral intake.  Encouraged to eat at least 2000 2500 cal/day.  Offered nutritionist consult but patient declined.  No further work-up of weight loss planned unless significant changes.  He is up-to-date on labs, performed last month.  Meld was 10.  Labs stable.  Right upper quadrant ultrasound last month as well showed cholelithiasis with gallbladder wall thickening, cirrhosis, no focal liver lesions.  Given no pericholecystic fluid or Murphy's sign, patient clinically asymptomatic, plan to follow clinically for any signs of cholecystitis.  Chronically has had gallbladder wall thickening noted felt to be secondary to cirrhosis.  Wt Readings from Last 3 Encounters:  03/05/19 176 lb 3.2 oz (79.9 kg)  10/21/18 190 lb 12.8 oz (86.5 kg)  05/05/18 199 lb 6.4 oz (90.4 kg)   No abdominal pain. Ensure and Carnation both cause stomach upset, nausea. No melena, brbpr. No heartburn in a year.Eats at his sisters on Tuesdays and enjoys that. She will sends home with leftovers that he will eat for a few days. All liquids without calories. He is happy about his weight loss. States he knows it is from food restriction. States PCP is concerned too but he is not. He weighs himself at home from time to time. When asked if he had a final goal weight he said no. He doesn't feel like anything is wrong and is happy with his current weight.   Current Outpatient Medications   Medication Sig Dispense Refill  . aspirin 81 MG tablet Take 81 mg by mouth daily.     . carvedilol (COREG) 12.5 MG tablet Take 12.5 mg by mouth 2 (two) times daily with a meal.      . fenofibrate (TRICOR) 145 MG tablet Take 72.5 mg by mouth daily. Takes 1/2 tablet daily    . lisinopril (PRINIVIL,ZESTRIL) 40 MG tablet Take 20 mg by mouth daily.     . Multiple Vitamins-Minerals (ONE-A-DAY MENS 50+ ADVANTAGE) TABS Take 1 tablet by mouth daily.    . simvastatin (ZOCOR) 20 MG tablet Take 20 mg by mouth at bedtime.      No current facility-administered medications for this visit.     Allergies as of 03/05/2019 - Review Complete 03/05/2019  Allergen Reaction Noted  . Iohexol Hives 05/21/2007    ROS:  General: Negative for anorexia,   fever, chills, fatigue, weakness. See hpi ENT: Negative for hoarseness, difficulty swallowing , nasal congestion. CV: Negative for chest pain, angina, palpitations, dyspnea on exertion, peripheral edema.  Respiratory: Negative for dyspnea at rest, dyspnea on exertion, cough, sputum, wheezing.  GI: See history of present illness. GU:  Negative for dysuria, hematuria, urinary incontinence, urinary frequency, nocturnal urination.  Endo: see hpi   Physical Examination:   BP 105/63   Pulse 68   Temp 98.2 F (36.8 C) (Oral)   Ht 6\' 2"  (1.88 m)   Wt 176 lb 3.2 oz (  79.9 kg)   BMI 22.62 kg/m   General: Thin elderly male in no acute distress.  Eyes: No icterus. Mouth: masked Abdomen: Bowel sounds are normal, nontender, nondistended, no hepatosplenomegaly or masses, no abdominal bruits or hernia , no rebound or guarding.   Extremities: No lower extremity edema. No clubbing or deformities. Neuro: Alert and oriented x 4   Skin: Warm and dry, no jaundice.   Psych: Alert and cooperative, normal mood and affect.  Labs:   Lab Results  Component Value Date   CREATININE 1.33 (H) 02/02/2019   BUN 22 02/02/2019   NA 143 02/02/2019   K 4.8 02/02/2019   CL 107  02/02/2019   CO2 32 02/02/2019   Lab Results  Component Value Date   ALT 10 02/02/2019   AST 20 02/02/2019   ALKPHOS 52 05/28/2011   BILITOT 0.8 02/02/2019   Lab Results  Component Value Date   WBC 3.7 (L) 02/02/2019   HGB 12.7 (L) 02/02/2019   HCT 37.0 (L) 02/02/2019   MCV 96.4 02/02/2019   PLT 278 02/02/2019   Lab Results  Component Value Date   INR 1.1 02/02/2019   INR 1.1 07/25/2018   INR 1.1 01/17/2018      Imaging Studies: No results found.

## 2019-03-05 NOTE — Assessment & Plan Note (Signed)
Stable. Up to date on labs and ultrasound. Will continue labs and u/s in six months. Ov here after studies.

## 2019-03-05 NOTE — Patient Instructions (Signed)
1. Given your age, height, you should try to keep your weight at or above 170 pounds. Please increase your oral intake.  2. You will be due another liver ultrasound and labs in 07/2019.  3. Follow up office visit in 07/2019. Please call sooner if your weight drops below 170 pounds.

## 2019-03-05 NOTE — Assessment & Plan Note (Signed)
Extensively evaluated. Patient with weight loss likely due to inadequate daily caloric intake. He is happy about weight loss but does plan to try and eat enough to maintain somewhere from 170-175. He is to call if weight drops below that. Will obtain copy of PCP records including recent labs for review. Patient declined nutrition consult.

## 2019-03-13 DIAGNOSIS — Z23 Encounter for immunization: Secondary | ICD-10-CM | POA: Diagnosis not present

## 2019-03-19 DIAGNOSIS — E782 Mixed hyperlipidemia: Secondary | ICD-10-CM | POA: Diagnosis not present

## 2019-03-19 DIAGNOSIS — E1140 Type 2 diabetes mellitus with diabetic neuropathy, unspecified: Secondary | ICD-10-CM | POA: Diagnosis not present

## 2019-03-19 DIAGNOSIS — N183 Chronic kidney disease, stage 3 (moderate): Secondary | ICD-10-CM | POA: Diagnosis not present

## 2019-03-19 DIAGNOSIS — D649 Anemia, unspecified: Secondary | ICD-10-CM | POA: Diagnosis not present

## 2019-03-19 DIAGNOSIS — D509 Iron deficiency anemia, unspecified: Secondary | ICD-10-CM | POA: Diagnosis not present

## 2019-03-19 DIAGNOSIS — I10 Essential (primary) hypertension: Secondary | ICD-10-CM | POA: Diagnosis not present

## 2019-03-19 DIAGNOSIS — E1122 Type 2 diabetes mellitus with diabetic chronic kidney disease: Secondary | ICD-10-CM | POA: Diagnosis not present

## 2019-03-20 DIAGNOSIS — N183 Chronic kidney disease, stage 3 (moderate): Secondary | ICD-10-CM | POA: Diagnosis not present

## 2019-03-20 DIAGNOSIS — D649 Anemia, unspecified: Secondary | ICD-10-CM | POA: Diagnosis not present

## 2019-03-20 DIAGNOSIS — E1122 Type 2 diabetes mellitus with diabetic chronic kidney disease: Secondary | ICD-10-CM | POA: Diagnosis not present

## 2019-03-20 DIAGNOSIS — E782 Mixed hyperlipidemia: Secondary | ICD-10-CM | POA: Diagnosis not present

## 2019-03-20 DIAGNOSIS — I10 Essential (primary) hypertension: Secondary | ICD-10-CM | POA: Diagnosis not present

## 2019-03-20 DIAGNOSIS — R460 Very low level of personal hygiene: Secondary | ICD-10-CM | POA: Diagnosis not present

## 2019-03-20 DIAGNOSIS — Z86010 Personal history of colonic polyps: Secondary | ICD-10-CM | POA: Diagnosis not present

## 2019-03-20 DIAGNOSIS — E440 Moderate protein-calorie malnutrition: Secondary | ICD-10-CM | POA: Diagnosis not present

## 2019-03-24 DIAGNOSIS — N183 Chronic kidney disease, stage 3 (moderate): Secondary | ICD-10-CM | POA: Diagnosis not present

## 2019-03-24 DIAGNOSIS — I10 Essential (primary) hypertension: Secondary | ICD-10-CM | POA: Diagnosis not present

## 2019-03-24 DIAGNOSIS — R0602 Shortness of breath: Secondary | ICD-10-CM | POA: Diagnosis not present

## 2019-03-24 DIAGNOSIS — D649 Anemia, unspecified: Secondary | ICD-10-CM | POA: Diagnosis not present

## 2019-03-24 DIAGNOSIS — E440 Moderate protein-calorie malnutrition: Secondary | ICD-10-CM | POA: Diagnosis not present

## 2019-03-24 DIAGNOSIS — E1122 Type 2 diabetes mellitus with diabetic chronic kidney disease: Secondary | ICD-10-CM | POA: Diagnosis not present

## 2019-03-24 DIAGNOSIS — K7030 Alcoholic cirrhosis of liver without ascites: Secondary | ICD-10-CM | POA: Diagnosis not present

## 2019-03-24 DIAGNOSIS — Z86010 Personal history of colonic polyps: Secondary | ICD-10-CM | POA: Diagnosis not present

## 2019-03-24 DIAGNOSIS — R460 Very low level of personal hygiene: Secondary | ICD-10-CM | POA: Diagnosis not present

## 2019-03-24 DIAGNOSIS — E782 Mixed hyperlipidemia: Secondary | ICD-10-CM | POA: Diagnosis not present

## 2019-03-25 ENCOUNTER — Other Ambulatory Visit: Payer: Self-pay | Admitting: Internal Medicine

## 2019-03-25 ENCOUNTER — Other Ambulatory Visit (HOSPITAL_COMMUNITY): Payer: Self-pay | Admitting: Internal Medicine

## 2019-03-25 DIAGNOSIS — R634 Abnormal weight loss: Secondary | ICD-10-CM

## 2019-03-25 DIAGNOSIS — R0602 Shortness of breath: Secondary | ICD-10-CM

## 2019-03-26 ENCOUNTER — Telehealth: Payer: Self-pay

## 2019-03-26 NOTE — Telephone Encounter (Signed)
Lab results received from Dr. Juel Burrow office. Hemoglobin 12.1, hematocrit 37.1, glucose 112, creatinine 1.33, alkaline phos 35. Results placed in LSL's box out side of door.

## 2019-03-27 NOTE — Telephone Encounter (Signed)
Reviewed labs.  As stated below in addition sodium 143, creatinine 1.33, total bilirubin 0.6, alkaline phosphatase 35, AST 20, ALT 14, A1c 5.1, ferritin 369, iron 107, iron saturations 37%, TIBC 286.

## 2019-04-08 ENCOUNTER — Other Ambulatory Visit: Payer: Self-pay

## 2019-04-08 ENCOUNTER — Ambulatory Visit (HOSPITAL_COMMUNITY)
Admission: RE | Admit: 2019-04-08 | Discharge: 2019-04-08 | Disposition: A | Discharge status: Home / Self Care | Payer: Medicare Other | Source: Ambulatory Visit | Attending: Internal Medicine | Admitting: Internal Medicine

## 2019-04-08 DIAGNOSIS — R634 Abnormal weight loss: Secondary | ICD-10-CM | POA: Insufficient documentation

## 2019-04-08 DIAGNOSIS — R0602 Shortness of breath: Secondary | ICD-10-CM | POA: Diagnosis not present

## 2019-04-21 DIAGNOSIS — E440 Moderate protein-calorie malnutrition: Secondary | ICD-10-CM | POA: Diagnosis not present

## 2019-04-21 DIAGNOSIS — R0602 Shortness of breath: Secondary | ICD-10-CM | POA: Diagnosis not present

## 2019-04-21 DIAGNOSIS — I10 Essential (primary) hypertension: Secondary | ICD-10-CM | POA: Diagnosis not present

## 2019-04-21 DIAGNOSIS — E782 Mixed hyperlipidemia: Secondary | ICD-10-CM | POA: Diagnosis not present

## 2019-04-21 DIAGNOSIS — K7030 Alcoholic cirrhosis of liver without ascites: Secondary | ICD-10-CM | POA: Diagnosis not present

## 2019-04-21 DIAGNOSIS — N183 Chronic kidney disease, stage 3 unspecified: Secondary | ICD-10-CM | POA: Diagnosis not present

## 2019-04-21 DIAGNOSIS — E1122 Type 2 diabetes mellitus with diabetic chronic kidney disease: Secondary | ICD-10-CM | POA: Diagnosis not present

## 2019-04-21 DIAGNOSIS — R460 Very low level of personal hygiene: Secondary | ICD-10-CM | POA: Diagnosis not present

## 2019-05-05 DIAGNOSIS — E782 Mixed hyperlipidemia: Secondary | ICD-10-CM | POA: Diagnosis not present

## 2019-05-05 DIAGNOSIS — R0602 Shortness of breath: Secondary | ICD-10-CM | POA: Diagnosis not present

## 2019-05-05 DIAGNOSIS — I10 Essential (primary) hypertension: Secondary | ICD-10-CM | POA: Diagnosis not present

## 2019-05-05 DIAGNOSIS — N183 Chronic kidney disease, stage 3 unspecified: Secondary | ICD-10-CM | POA: Diagnosis not present

## 2019-05-05 DIAGNOSIS — E440 Moderate protein-calorie malnutrition: Secondary | ICD-10-CM | POA: Diagnosis not present

## 2019-05-05 DIAGNOSIS — K7030 Alcoholic cirrhosis of liver without ascites: Secondary | ICD-10-CM | POA: Diagnosis not present

## 2019-05-05 DIAGNOSIS — E1122 Type 2 diabetes mellitus with diabetic chronic kidney disease: Secondary | ICD-10-CM | POA: Diagnosis not present

## 2019-05-05 DIAGNOSIS — R460 Very low level of personal hygiene: Secondary | ICD-10-CM | POA: Diagnosis not present

## 2019-05-29 ENCOUNTER — Other Ambulatory Visit: Payer: Self-pay

## 2019-05-29 ENCOUNTER — Encounter: Payer: Self-pay | Admitting: Cardiology

## 2019-05-29 ENCOUNTER — Ambulatory Visit (INDEPENDENT_AMBULATORY_CARE_PROVIDER_SITE_OTHER): Payer: Medicare Other | Admitting: Cardiology

## 2019-05-29 VITALS — BP 93/57 | HR 69 | Temp 98.6°F | Ht 74.0 in | Wt 167.0 lb

## 2019-05-29 DIAGNOSIS — I259 Chronic ischemic heart disease, unspecified: Secondary | ICD-10-CM | POA: Diagnosis not present

## 2019-05-29 DIAGNOSIS — E782 Mixed hyperlipidemia: Secondary | ICD-10-CM | POA: Diagnosis not present

## 2019-05-29 DIAGNOSIS — I951 Orthostatic hypotension: Secondary | ICD-10-CM | POA: Diagnosis not present

## 2019-05-29 IMAGING — US US ABDOMEN LIMITED
1 series · 14 of 25 positions shown · non-contrast
Comparison: MRI 05/17/2016.  CT 05/09/2016.

CLINICAL DATA: Follow-up cirrhosis.  HCC screening.

EXAM:
ULTRASOUND ABDOMEN LIMITED RIGHT UPPER QUADRANT

[Series 1: us abdomen limited · 0.19mm/px · 14 of 44 slices shown]
[im 1/44]
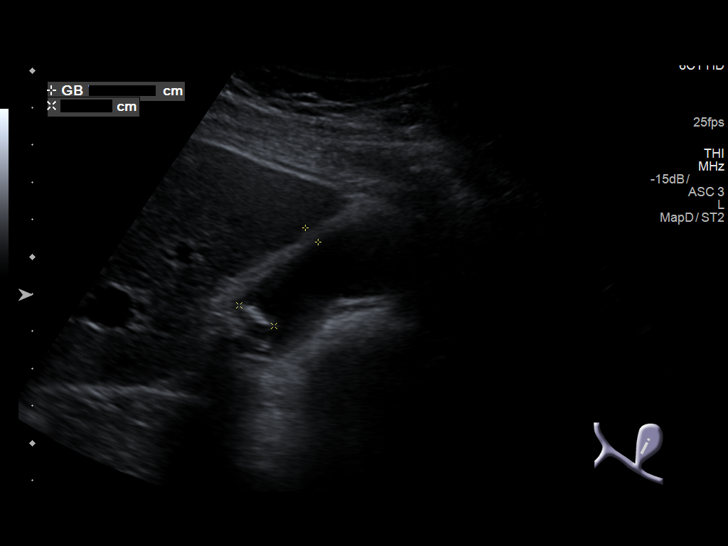
[im 4/44]
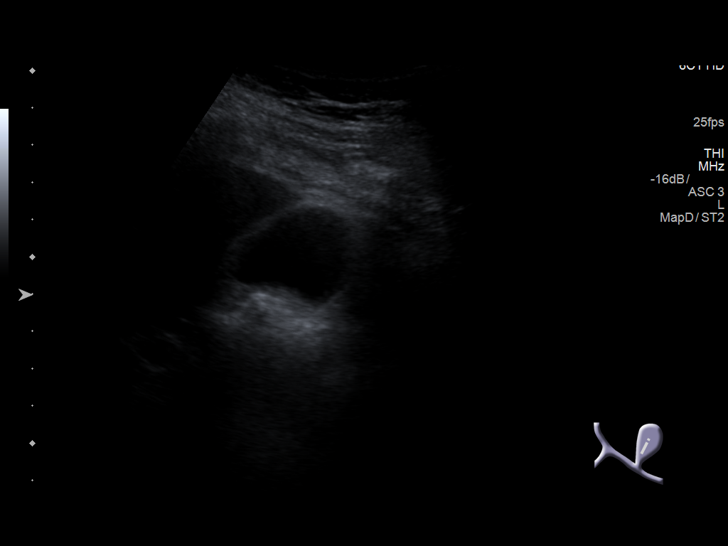
[im 8/44]
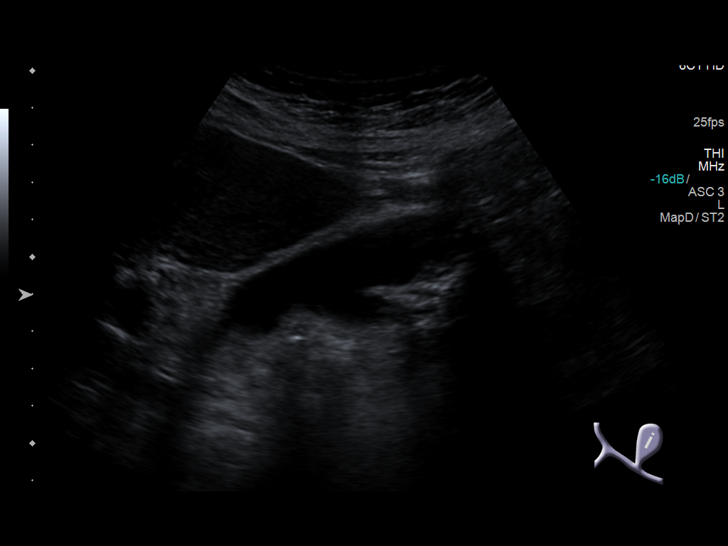
[im 11/44]
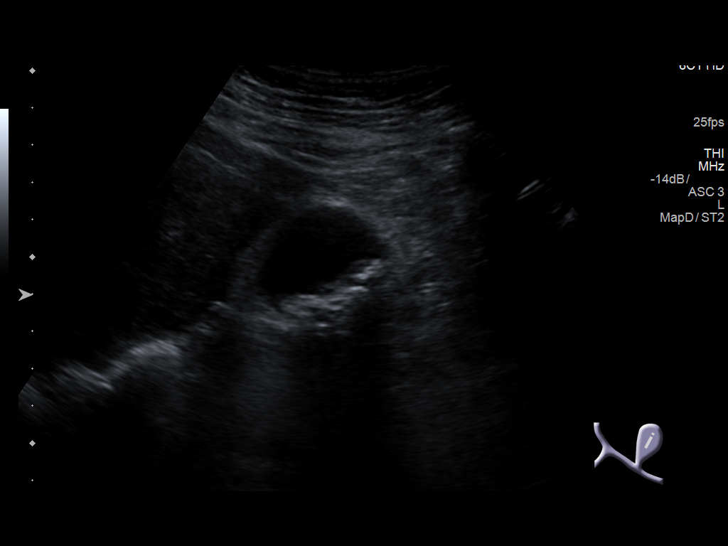
[im 15/44]
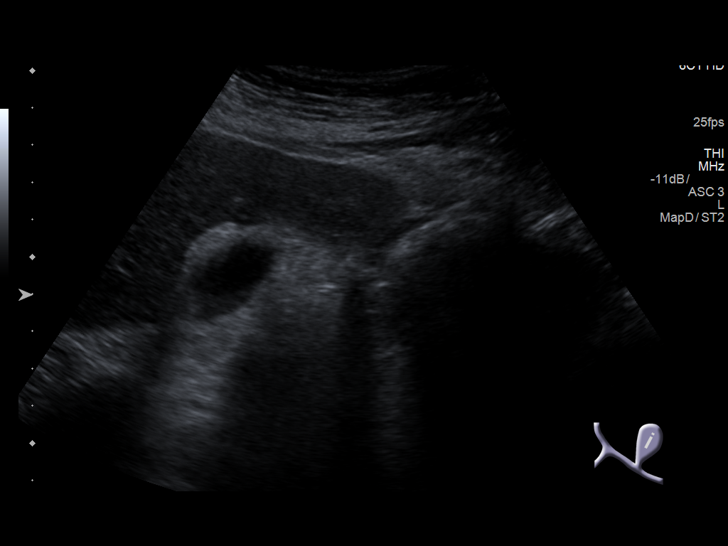
[im 17/44]
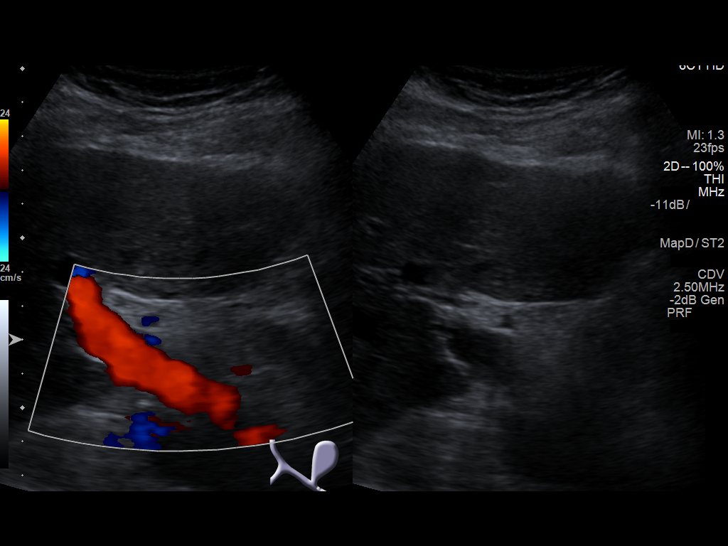
[im 20/44]
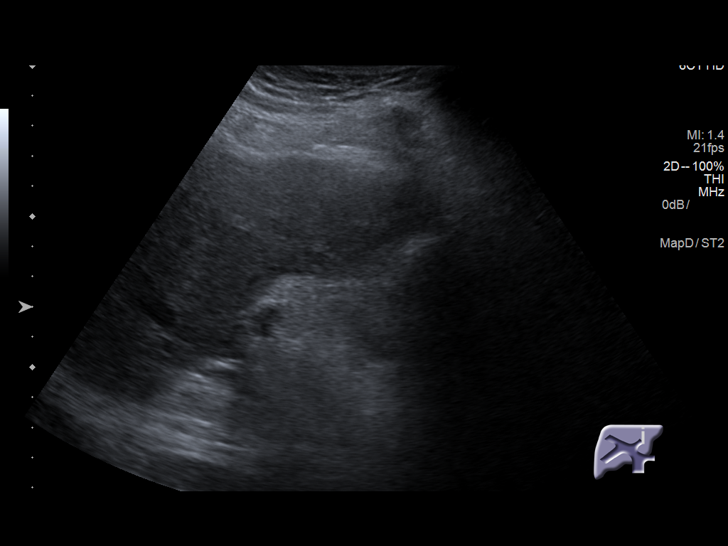
[im 24/44]
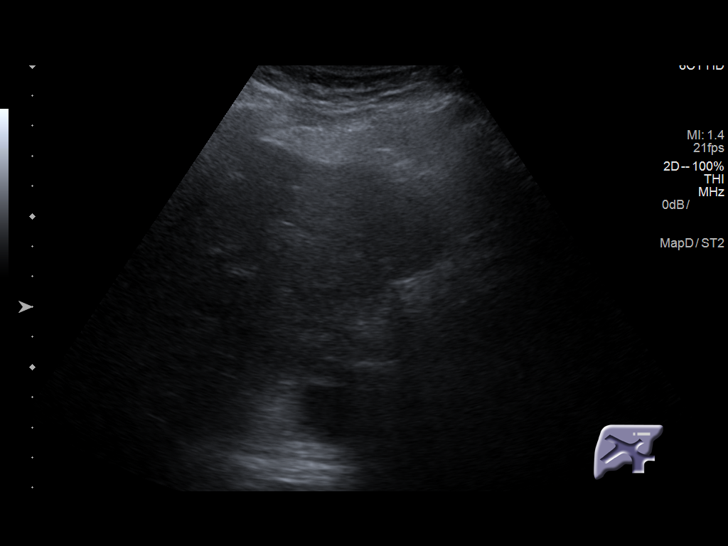
[im 27/44]
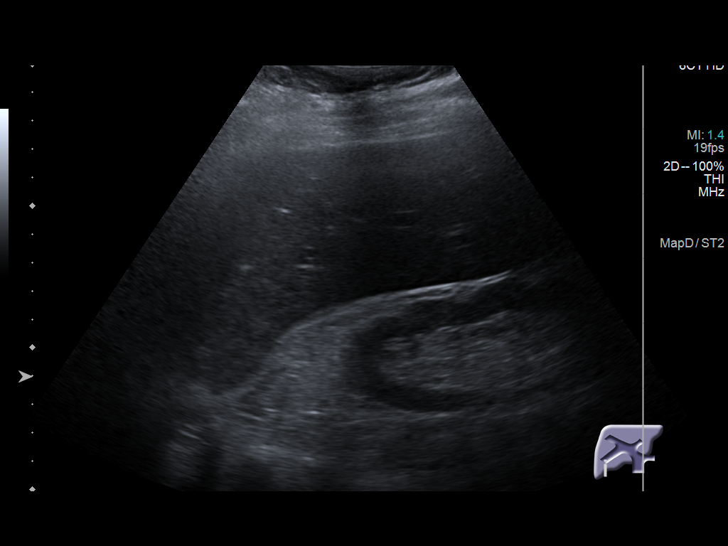
[im 29/44]
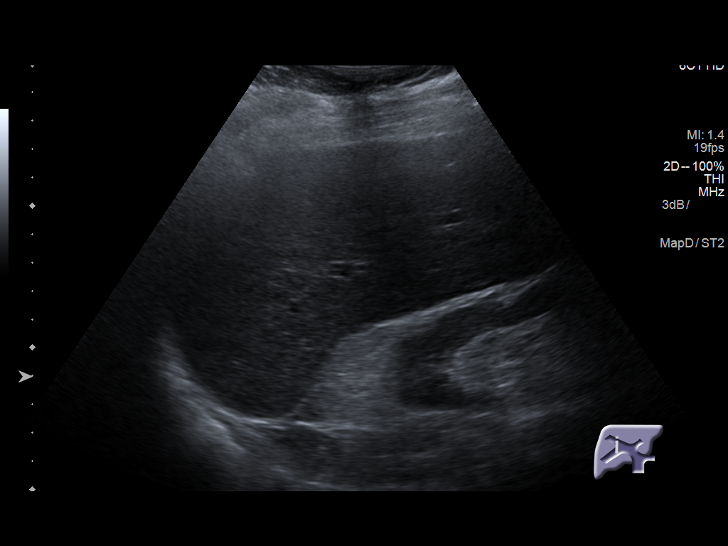
[im 33/44]
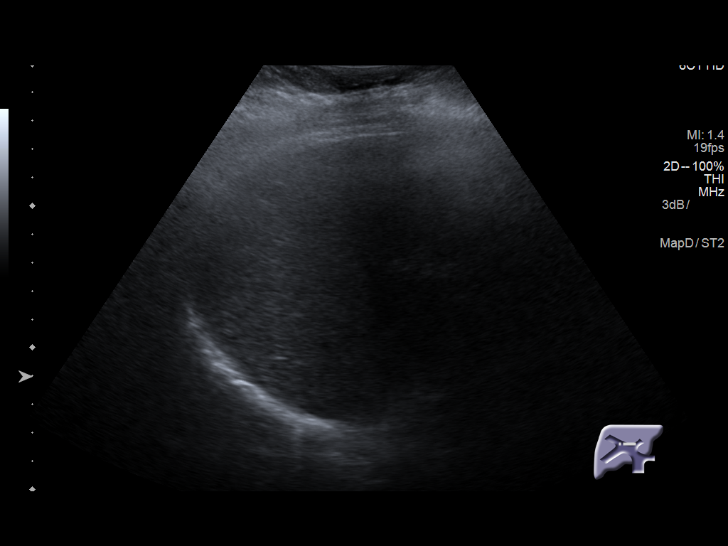
[im 36/44]
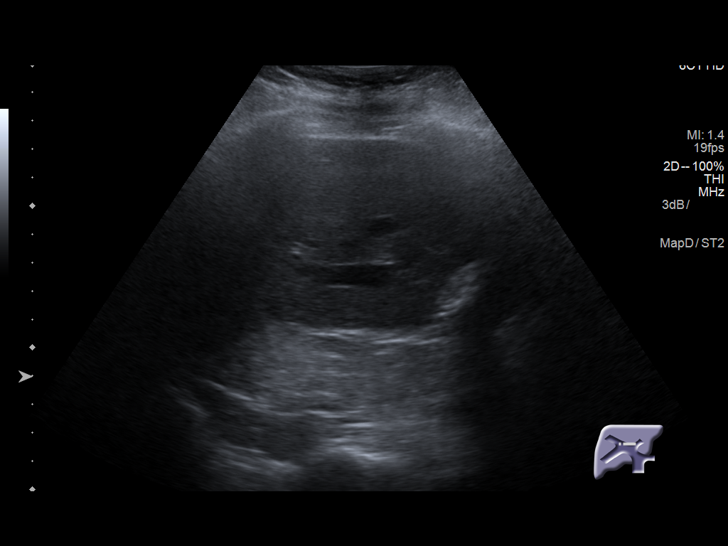
[im 40/44]
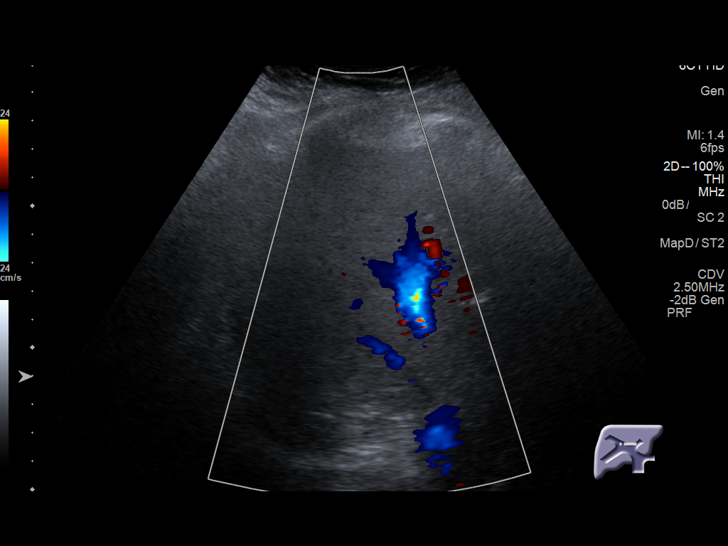
[im 44/44]
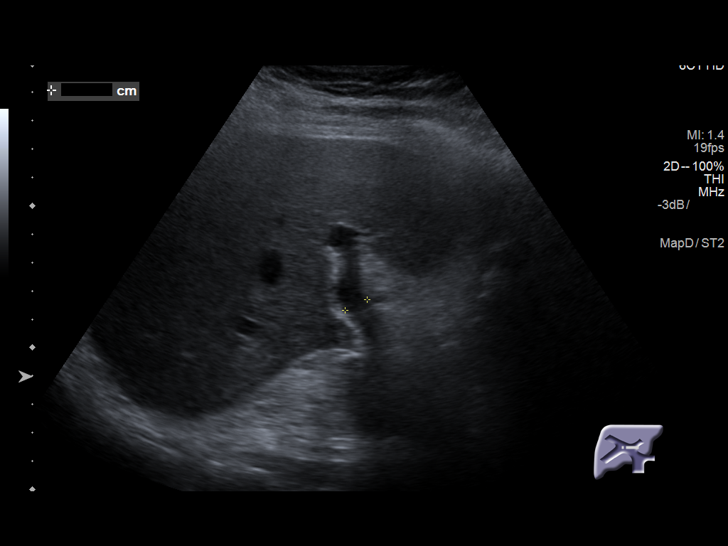

[14 of 25 positions shown; findings below may reference images not displayed]

FINDINGS: Gallbladder:

Multiple gallstones measuring up to 11 mm. Gallbladder wall
thickness 5 mm. Cholecystitis cannot be excluded. Negative Murphy
sign .

Common bile duct:

Diameter: 3 mm

Liver:

Increased hepatic echogenicity consistent fatty infiltration and/or
hepatocellular disease. Slightly lobular contour. Cirrhosis cannot
be excluded. Portal vein patent.
IMPRESSION: 1. Multiple gallstones measuring up to 11 mm. Gallbladder wall is
thickened at 5 mm. Cholecystitis cannot be excluded. Negative Murphy
sign .

2. Increased hepatic echogenicity consistent fatty infiltration
and/or hepatocellular disease. Liver has a slightly lobular contour.
Cirrhosis cannot be excluded .

## 2019-05-29 MED ORDER — LISINOPRIL 10 MG PO TABS: 10.0000 mg | ORAL_TABLET | Freq: Every day | ORAL | 3 refills | Status: DC

## 2019-05-29 NOTE — Patient Instructions (Signed)
Medication Instructions:  DECREASE Lisinopril to 10 mg daily  *If you need a refill on your cardiac medications before your next appointment, please call your pharmacy*  Lab Work: None today If you have labs (blood work) drawn today and your tests are completely normal, you will receive your results only by: Marland Kitchen MyChart Message (if you have MyChart) OR . A paper copy in the mail If you have any lab test that is abnormal or we need to change your treatment, we will call you to review the results.  Testing/Procedures: None today  Follow-Up: At [facility], you and your health needs are our priority.  As part of our continuing mission to provide you with exceptional heart care, we have created designated Provider Care Teams.  These Care Teams include your primary Cardiologist (physician) and Advanced Practice Providers (APPs -  Physician Assistants and Nurse Practitioners) who all work together to provide you with the care you need, when you need it.  Your next appointment:   6 months  The format for your next appointment:   In Person  Provider:   Rozann Lesches, MD  Other Instructions None     Thank you for choosing [facility] !

## 2019-05-29 NOTE — Progress Notes (Signed)
Cardiology Office Note  Date: 05/29/2019   ID: Mark Le, DOB [birth year], MRN [id number]  PCP:  Celene Squibb, MD  Cardiologist:  Rozann Lesches, MD Electrophysiologist:  None   Chief Complaint  Patient presents with  . Cardiac follow-up    History of Present Illness: Mark Le is an 84 y.o. male that I have not seen since August 2018.  He is referred back to the office for follow-up by Dr. Nevada Crane.  He does not report any chest pain or worsening breathlessness with typical activities.  He does tell me that he has had sometimes after standing up quickly where he feels lightheaded and unsteady as if he might fall.  He has had no syncope.  He has known coronary artery calcifications by CT imaging, including recent study done in September of this year.  Previous Myoview from 2018 showed evidence of of inferior wall infarct without active ischemia and normal LVEF.  I reviewed his lab work from September as outlined below.  LDL was 75 and hemoglobin A1c normal.  We went over his medications.  We discussed reducing lisinopril further in light of low blood pressures to avoid orthostasis.  Otherwise he continues on aspirin, beta-blocker, and statin without obvious angina symptoms.  Past Medical History:  Diagnosis Date  . Aneurysm, thoracic aortic ([place]) 08/2016   4.2cm  . CAD (coronary artery disease)    Evidence of previous inferior infarct by Myoview 2018  . Cirrhosis ([place])    Hep A and Hep B labs are negative. pt recieved Hep A and Hep B vaccines at [facility] in [place].   . Colon cancer ([place]) 2002   T3 N2 tumor with 2 of 4 positive lymph nodes, s/p resection and colostomy and post-op chemotherapy. reversal of colostomy 2003.   Marland Kitchen Essential hypertension   . Gout 06/04/2011  . Hyperlipidemia   . Type 2 diabetes mellitus ([place])   . Ventral hernia 07/24/2011    Past Surgical History:  Procedure Laterality Date  . CATARACT EXTRACTION W/PHACO Left 09/11/2016   Procedure:  CATARACT EXTRACTION PHACO AND INTRAOCULAR LENS PLACEMENT (IOC);  Surgeon: Rutherford Guys, MD;  Location: [facility];  Service: Ophthalmology;  Laterality: Left;  CDE: 19.23  . CATARACT EXTRACTION W/PHACO Right 10/09/2016   Procedure: CATARACT EXTRACTION PHACO AND INTRAOCULAR LENS PLACEMENT (IOC);  Surgeon: Rutherford Guys, MD;  Location: [facility];  Service: Ophthalmology;  Laterality: Right;  CDE: 8.08  . COLONOSCOPY  11/2002   Dr. Alphonsa Overall: anastomosis at 45cm, otherwise unremarkable.  . COLONOSCOPY  05/2006   Dr. Alphonsa Overall: 4 polyps removed.  . COLONOSCOPY  07/2009   Dr. Alphonsa Overall: 2 tubular adenomas, next TCS five years.   . COLONOSCOPY N/A 06/20/2016   Dr. Gala Romney multiple colonic polyps removed, several in piecemeal fashion, a ascending colon polyp tubular adenoma with focal high-grade dysplasia, cecal polyps tubulovillous adenoma. Next colonoscopy June 2018  . COLONOSCOPY N/A 12/27/2016   Dr. Gala Romney: Internal hemorrhoids, grade 1.  3 semi-pedunculated polyps in the ascending colon, removed.  Polyp in the appendiceal orifice (tubulovillous adenoma), sessile, piecemeal removal, not complete.  8 mm polyp in the ascending colon  . COLOSTOMY REVERSAL  01/2002   Dr. Alphonsa Overall  . ESOPHAGOGASTRODUODENOSCOPY N/A 06/20/2016   Dr. Gala Romney: Large hiatal hernia, esophagus normal  . LAPAROSCOPIC APPENDECTOMY N/A 04/26/2017   Procedure: APPENDECTOMY LAPAROSCOPIC;  Surgeon: Michael Boston, MD;  Location: [facility];  Service: General;  Laterality: N/A;  . LYSIS OF ADHESION N/A 04/26/2017  Procedure: LYSIS OF ADHESION;  Surgeon: Michael Boston, MD;  Location: [facility];  Service: General;  Laterality: N/A;  . PARTIAL COLECTOMY  04/2001   Dr. Alphonsa Overall: obstructing colon carcinoma of the left colon with resection and colostomy  . VENTRAL HERNIA REPAIR  04/2005   Dr. Alphonsa Overall    Current Outpatient Medications  Medication Sig Dispense Refill  . aspirin 81 MG tablet Take 81 mg by mouth daily.     . carvedilol  (COREG) 12.5 MG tablet Take 12.5 mg by mouth 2 (two) times daily with a meal.      . fenofibrate (TRICOR) 145 MG tablet Take 72.5 mg by mouth daily. Takes 1/2 tablet daily    . Multiple Vitamins-Minerals (ONE-A-DAY MENS 50+ ADVANTAGE) TABS Take 1 tablet by mouth daily.    . simvastatin (ZOCOR) 20 MG tablet Take 20 mg by mouth at bedtime.     Marland Kitchen lisinopril (ZESTRIL) 10 MG tablet Take 1 tablet (10 mg total) by mouth daily. 90 tablet 3   No current facility-administered medications for this visit.    Allergies:  Iohexol   Social History: The patient  reports that he quit smoking about 38 years ago. His smoking use included cigarettes. He has a 22.50 pack-year smoking history. He has never used smokeless tobacco. He reports that he does not drink alcohol or use drugs.   ROS:  Please see the history of present illness. Otherwise, complete review of systems is positive for none.  All other systems are reviewed and negative.   Physical Exam: VS:  BP (!) 93/57 (BP Location: Left Arm)   Pulse 69   Temp 98.6 F (37 C)   Ht 6\' 2"  (1.88 m)   Wt 167 lb (75.8 kg)   SpO2 98%   BMI 21.44 kg/m , BMI Body mass index is 21.44 kg/m.  Wt Readings from Last 3 Encounters:  05/29/19 167 lb (75.8 kg)  03/05/19 176 lb 3.2 oz (79.9 kg)  10/21/18 190 lb 12.8 oz (86.5 kg)    General: Elderly male.  Patient appears comfortable at rest. HEENT: Conjunctiva and lids normal, wearing a mask. Neck: Supple, no elevated JVP or carotid bruits, no thyromegaly. Lungs: Clear to auscultation, nonlabored breathing at rest. Cardiac: Regular rate and rhythm, no S3, soft systolic murmur, no pericardial rub. Abdomen: Soft, nontender, bowel sounds present. Extremities: Mild ankle edema, distal pulses 2+. Skin: Warm and dry. Musculoskeletal: No kyphosis. Neuropsychiatric: Alert and oriented x3, affect grossly appropriate.  ECG:  An ECG dated 09/06/2016 was personally reviewed today and demonstrated:  Sinus rhythm with  prolonged PR interval, rule out old inferior infarct pattern, low voltage.  Recent Labwork: 02/02/2019: ALT 10; AST 20; BUN 22; Creat 1.33; Hemoglobin 12.7; Platelets 278; Potassium 4.8; Sodium 143  September 2020: Hemoglobin 12.1, platelets 235, BUN 22, creatinine 1.33, potassium 4.5, AST 20, ALT 14, cholesterol 132, triglycerides 95, HDL 39, LDL 75, hemoglobin A1c 5.1%  Other Studies Reviewed Today:  Chest CT 04/08/2019: FINDINGS: Cardiovascular: Aortic atherosclerosis. Extensive coronary artery calcification. Slight dilatation of the ascending thoracic aorta to a diameter 4.1 cm, unchanged since the prior study. Overall heart size is normal. No pericardial effusion.  Mediastinum/Nodes: No enlarged mediastinal or axillary lymph nodes. Thyroid gland, trachea, and esophagus demonstrate no significant findings.  Lungs/Pleura: Lungs are clear. No pleural effusion or pneumothorax.  Upper Abdomen: Chronic cholelithiasis. Stable small right adrenal lipoma.  Musculoskeletal: No chest wall mass or suspicious bone lesions identified.  IMPRESSION: 1. No acute abnormalities  of the chest. 2. Extensive coronary artery calcifications. 3. Stable mild dilatation of the ascending thoracic aorta to a diameter of 4.1 cm. 4. Chronic cholelithiasis. 5. Stable small right adrenal lipoma.  Aortic Atherosclerosis (ICD10-I70.0).  Lexiscan Myoview 02/19/2017:  Findings consistent with prior myocardial infarction.  This is a low risk study.  The left ventricular ejection fraction is normal (55-65%).  There was no ST segment deviation noted during stress.   Small inferior wall infarct from apex to base with no ischemia EF estimated 65% with no RWMAls suggest echo correlation  Echocardiogram 03/04/2017: Study Conclusions  - Left ventricle: The cavity size was normal. Wall thickness was   increased in a pattern of mild LVH. Systolic function was normal.   The estimated ejection fraction  was in the range of 55% to 60%.   Wall motion was normal; there were no regional wall motion   abnormalities. Doppler parameters are consistent with abnormal   left ventricular relaxation (grade 1 diastolic dysfunction). - Aortic valve: Mildly calcified leaflets. Sclerosis without   stenosis. There was trivial regurgitation.  Assessment and Plan:  1.  Orthostatic lightheadedness with low blood pressure today.  Would reduce lisinopril to 10 mg daily, potentially even further depending on blood pressure response.  2.  Ischemic heart disease based on previous work-up including Myoview consistent with prior inferior wall infarct and extensive coronary artery calcifications by CT imaging.  We plan to continue medical therapy and observation in the absence of obvious progressive angina symptoms.  He is on aspirin, Coreg, and statin therapy.  3.  Mixed hyperlipidemia, on Zocor with recent LDL 75.  Medication Adjustments/Labs and Tests Ordered: Current medicines are reviewed at length with the patient today.  Concerns regarding medicines are outlined above.   Tests Ordered: No orders of the defined types were placed in this encounter.   Medication Changes: Meds ordered this encounter  Medications  . lisinopril (ZESTRIL) 10 MG tablet    Sig: Take 1 tablet (10 mg total) by mouth daily.    Dispense:  90 tablet    Refill:  3    05/29/2019 dose DECREASED to 10 mg qd    Disposition:  Follow up 6 months in the [place] office.  Signed, Satira Sark, MD, [facility] 05/29/2019 11:19 AM    [facility] [address], [place], [place] [address] Phone: [phone]; Fax: [phone]

## 2019-06-10 DIAGNOSIS — E440 Moderate protein-calorie malnutrition: Secondary | ICD-10-CM | POA: Diagnosis not present

## 2019-06-10 DIAGNOSIS — I10 Essential (primary) hypertension: Secondary | ICD-10-CM | POA: Diagnosis not present

## 2019-06-10 DIAGNOSIS — E1122 Type 2 diabetes mellitus with diabetic chronic kidney disease: Secondary | ICD-10-CM | POA: Diagnosis not present

## 2019-06-10 DIAGNOSIS — K7030 Alcoholic cirrhosis of liver without ascites: Secondary | ICD-10-CM | POA: Diagnosis not present

## 2019-06-10 DIAGNOSIS — R460 Very low level of personal hygiene: Secondary | ICD-10-CM | POA: Diagnosis not present

## 2019-06-10 DIAGNOSIS — E782 Mixed hyperlipidemia: Secondary | ICD-10-CM | POA: Diagnosis not present

## 2019-06-10 DIAGNOSIS — R0602 Shortness of breath: Secondary | ICD-10-CM | POA: Diagnosis not present

## 2019-06-10 DIAGNOSIS — N183 Chronic kidney disease, stage 3 unspecified: Secondary | ICD-10-CM | POA: Diagnosis not present

## 2019-06-29 ENCOUNTER — Other Ambulatory Visit: Payer: Self-pay

## 2019-06-29 DIAGNOSIS — K7460 Unspecified cirrhosis of liver: Secondary | ICD-10-CM

## 2019-07-01 ENCOUNTER — Encounter: Payer: Self-pay | Admitting: Internal Medicine

## 2019-07-31 DIAGNOSIS — R460 Very low level of personal hygiene: Secondary | ICD-10-CM | POA: Diagnosis not present

## 2019-07-31 DIAGNOSIS — N1831 Chronic kidney disease, stage 3a: Secondary | ICD-10-CM | POA: Diagnosis not present

## 2019-07-31 DIAGNOSIS — E1122 Type 2 diabetes mellitus with diabetic chronic kidney disease: Secondary | ICD-10-CM | POA: Diagnosis not present

## 2019-07-31 DIAGNOSIS — E440 Moderate protein-calorie malnutrition: Secondary | ICD-10-CM | POA: Diagnosis not present

## 2019-07-31 DIAGNOSIS — D649 Anemia, unspecified: Secondary | ICD-10-CM | POA: Diagnosis not present

## 2019-07-31 DIAGNOSIS — E7849 Other hyperlipidemia: Secondary | ICD-10-CM | POA: Diagnosis not present

## 2019-07-31 DIAGNOSIS — I10 Essential (primary) hypertension: Secondary | ICD-10-CM | POA: Diagnosis not present

## 2019-07-31 DIAGNOSIS — K7030 Alcoholic cirrhosis of liver without ascites: Secondary | ICD-10-CM | POA: Diagnosis not present

## 2019-07-31 DIAGNOSIS — R0602 Shortness of breath: Secondary | ICD-10-CM | POA: Diagnosis not present

## 2019-07-31 DIAGNOSIS — I129 Hypertensive chronic kidney disease with stage 1 through stage 4 chronic kidney disease, or unspecified chronic kidney disease: Secondary | ICD-10-CM | POA: Diagnosis not present

## 2019-09-16 DIAGNOSIS — N183 Chronic kidney disease, stage 3 unspecified: Secondary | ICD-10-CM | POA: Diagnosis not present

## 2019-09-16 DIAGNOSIS — R460 Very low level of personal hygiene: Secondary | ICD-10-CM | POA: Diagnosis not present

## 2019-09-16 DIAGNOSIS — D649 Anemia, unspecified: Secondary | ICD-10-CM | POA: Diagnosis not present

## 2019-09-16 DIAGNOSIS — I129 Hypertensive chronic kidney disease with stage 1 through stage 4 chronic kidney disease, or unspecified chronic kidney disease: Secondary | ICD-10-CM | POA: Diagnosis not present

## 2019-09-16 DIAGNOSIS — Z86010 Personal history of colonic polyps: Secondary | ICD-10-CM | POA: Diagnosis not present

## 2019-09-16 DIAGNOSIS — E1122 Type 2 diabetes mellitus with diabetic chronic kidney disease: Secondary | ICD-10-CM | POA: Diagnosis not present

## 2019-09-16 DIAGNOSIS — E782 Mixed hyperlipidemia: Secondary | ICD-10-CM | POA: Diagnosis not present

## 2019-09-16 DIAGNOSIS — E440 Moderate protein-calorie malnutrition: Secondary | ICD-10-CM | POA: Diagnosis not present

## 2019-09-16 DIAGNOSIS — I10 Essential (primary) hypertension: Secondary | ICD-10-CM | POA: Diagnosis not present

## 2019-09-21 ENCOUNTER — Ambulatory Visit: Payer: Medicare Other | Attending: Internal Medicine

## 2019-09-21 DIAGNOSIS — Z23 Encounter for immunization: Secondary | ICD-10-CM | POA: Insufficient documentation

## 2019-09-21 NOTE — Progress Notes (Signed)
   Covid-19 Vaccination Clinic  Name:  Mark Le    MRN: [id number] DOB: March 13, 1935  09/21/2019  [name] was observed post Covid-19 immunization for 15 minutes without incident. He was provided with Vaccine Information Sheet and instruction to access the V-Safe system.   [name] was instructed to call 911 with any severe reactions post vaccine: Marland Kitchen Difficulty breathing  . Swelling of face and throat  . A fast heartbeat  . A bad rash all over body  . Dizziness and weakness   Immunizations Administered    Name Date Dose VIS Date Route   Pfizer COVID-19 Vaccine 09/21/2019 10:52 AM 0.3 mL 06/26/2019 Intramuscular   Manufacturer: [place]   Lot: [id number]   [place]: [id number]

## 2019-10-13 ENCOUNTER — Ambulatory Visit: Payer: Medicare Other | Attending: Internal Medicine

## 2019-10-13 DIAGNOSIS — Z23 Encounter for immunization: Secondary | ICD-10-CM

## 2019-10-13 NOTE — Progress Notes (Signed)
   Covid-19 Vaccination Clinic  Name:  Mark Le    MRN: [id number] DOB: [birth year]  10/13/2019  [name] was observed post Covid-19 immunization for 15 minutes without incident. He was provided with Vaccine Information Sheet and instruction to access the V-Safe system.   [name] was instructed to call 911 with any severe reactions post vaccine: Marland Kitchen Difficulty breathing  . Swelling of face and throat  . A fast heartbeat  . A bad rash all over body  . Dizziness and weakness   Immunizations Administered    Name Date Dose VIS Date Route   Pfizer COVID-19 Vaccine 10/13/2019 12:14 PM 0.3 mL 06/26/2019 Intramuscular   Manufacturer: [place]   Lot: [id number]   [place]: [id number]

## 2019-10-15 DIAGNOSIS — E7849 Other hyperlipidemia: Secondary | ICD-10-CM | POA: Diagnosis not present

## 2019-10-15 DIAGNOSIS — E782 Mixed hyperlipidemia: Secondary | ICD-10-CM | POA: Diagnosis not present

## 2019-10-15 DIAGNOSIS — E46 Unspecified protein-calorie malnutrition: Secondary | ICD-10-CM | POA: Diagnosis not present

## 2019-10-15 DIAGNOSIS — E440 Moderate protein-calorie malnutrition: Secondary | ICD-10-CM | POA: Diagnosis not present

## 2019-10-15 DIAGNOSIS — E1122 Type 2 diabetes mellitus with diabetic chronic kidney disease: Secondary | ICD-10-CM | POA: Diagnosis not present

## 2019-10-15 DIAGNOSIS — D649 Anemia, unspecified: Secondary | ICD-10-CM | POA: Diagnosis not present

## 2019-10-15 DIAGNOSIS — I10 Essential (primary) hypertension: Secondary | ICD-10-CM | POA: Diagnosis not present

## 2019-10-15 DIAGNOSIS — E441 Mild protein-calorie malnutrition: Secondary | ICD-10-CM | POA: Diagnosis not present

## 2019-10-15 DIAGNOSIS — D509 Iron deficiency anemia, unspecified: Secondary | ICD-10-CM | POA: Diagnosis not present

## 2019-10-15 DIAGNOSIS — E1022 Type 1 diabetes mellitus with diabetic chronic kidney disease: Secondary | ICD-10-CM | POA: Diagnosis not present

## 2019-10-15 DIAGNOSIS — I129 Hypertensive chronic kidney disease with stage 1 through stage 4 chronic kidney disease, or unspecified chronic kidney disease: Secondary | ICD-10-CM | POA: Diagnosis not present

## 2019-10-15 DIAGNOSIS — E1140 Type 2 diabetes mellitus with diabetic neuropathy, unspecified: Secondary | ICD-10-CM | POA: Diagnosis not present

## 2019-10-21 DIAGNOSIS — Z86010 Personal history of colonic polyps: Secondary | ICD-10-CM | POA: Diagnosis not present

## 2019-10-21 DIAGNOSIS — E782 Mixed hyperlipidemia: Secondary | ICD-10-CM | POA: Diagnosis not present

## 2019-10-21 DIAGNOSIS — R460 Very low level of personal hygiene: Secondary | ICD-10-CM | POA: Diagnosis not present

## 2019-10-21 DIAGNOSIS — E440 Moderate protein-calorie malnutrition: Secondary | ICD-10-CM | POA: Diagnosis not present

## 2019-10-21 DIAGNOSIS — D649 Anemia, unspecified: Secondary | ICD-10-CM | POA: Diagnosis not present

## 2019-10-21 DIAGNOSIS — N1831 Chronic kidney disease, stage 3a: Secondary | ICD-10-CM | POA: Diagnosis not present

## 2019-10-21 DIAGNOSIS — N183 Chronic kidney disease, stage 3 unspecified: Secondary | ICD-10-CM | POA: Diagnosis not present

## 2019-10-21 DIAGNOSIS — K7030 Alcoholic cirrhosis of liver without ascites: Secondary | ICD-10-CM | POA: Diagnosis not present

## 2019-10-21 DIAGNOSIS — E1122 Type 2 diabetes mellitus with diabetic chronic kidney disease: Secondary | ICD-10-CM | POA: Diagnosis not present

## 2019-10-21 DIAGNOSIS — I10 Essential (primary) hypertension: Secondary | ICD-10-CM | POA: Diagnosis not present

## 2019-10-21 DIAGNOSIS — Z0001 Encounter for general adult medical examination with abnormal findings: Secondary | ICD-10-CM | POA: Diagnosis not present

## 2019-10-21 DIAGNOSIS — R0602 Shortness of breath: Secondary | ICD-10-CM | POA: Diagnosis not present

## 2019-10-29 ENCOUNTER — Other Ambulatory Visit: Payer: Self-pay | Admitting: *Deleted

## 2019-10-29 NOTE — Patient Outreach (Signed)
[place] [facility]) Care Management  10/29/2019  AADHVIK GRADY 04/04/1935 [id number]   [facility] Telephone Assessment/Screen for MD Referral Date: 10/22/19 Referral Source: Dr Celene Squibb, Referral Reason: for SW per the referral  Insurance:.medicare  Outreach attempt unsuccessful #1    Mr OLUWASEMILORE SANDEZ answered.  THN RN CM introduced herself and informed him [facility] RN CM was asked to outreach to him on behalf of Dr Juel Burrow office  Mr Daemeon Bridwell  Informed [facility] RN CM he will speak with Dr Nevada Crane "I prefer not to talk on the telephone" [facility] RN CM voice understanding and informed him [facility] RN CM would update Dr Nevada Crane     Plan: [facility] RN CM sent an unsuccessful outreach letter and scheduled this patient for another call attempt within 4 business days   Routed note to MD    [place]. Lavina Hamman, RN, BSN, [place] Coordinator Office number [phone] Mobile number [phone]  Main THN number [phone] Fax number [phone]

## 2019-10-30 ENCOUNTER — Other Ambulatory Visit: Payer: Self-pay | Admitting: *Deleted

## 2019-10-30 NOTE — Patient Outreach (Signed)
[place] [facility]) Care Management  10/30/2019  ALWALEED HODGEN [birth year] [id number]   [place] coordination -collaboration with referring MD office   Called Dr Nevada Crane office, Spoke with Luetta Nutting to discuss the outreach to Mr Torti and his concern about not being aware of the referral and not wanting to engage telephonically at this time. THN screening will be telephonically. Inquired if an outreach can be placed to him to discuss the referral and to see if he is willing to engage for [facility] screening telephonically Amber to leave a message [facility] RN CM left CM's contact info.   Plan: [facility] scheduled this patient for another call attempt within 4-7 business days    Traven Davids L. Lavina Hamman, RN, BSN, [place] Coordinator Office number [phone] Mobile number [phone]  Main THN number [phone] Fax number [phone]

## 2019-11-04 ENCOUNTER — Other Ambulatory Visit: Payer: Self-pay | Admitting: *Deleted

## 2019-11-04 NOTE — Patient Outreach (Signed)
[place] [facility]) Care Management  11/04/2019  Mark Le 1935-04-25 [id number]   [facility] Telephone Assessment/Screen for MD Referral Date: 10/22/19 Referral Source: Dr Celene Squibb, Referral Reason: for SW per the referral  Insurance:.medicare  [facility] RN CM called Mark Le to attempt to engage and complete his screening per MD referral on 10/22/19 for SW   [facility] RN CM introduced herself to Mark Le and discussed the [facility] RN CM had called him on 10/29/19.   Mark Le was able to verify his name but would not verify his date of birth nor address [facility] RN CM reviewed what the [facility] program is, the purpose of the program and available [facility] staff Mark Le reports he had "spoke with him and told him I would not speak with anyone on the phone." (referring to Dr Nevada Crane) [facility] RN CM asked Mark Le how he would prefer [facility] to assist him. He reports "I don't know how you could help me. There is so much going on" Again [facility] RN CM reviewed the available [facility] Staff to assist  [name] RN inquired about his needs Mark Le again stated  "I don't know how you could help me. There is so much going on" Mark Le disconnected the call    [facility] RN CM left a voice message for Dr Celene Squibb at the designated prompt  #3 At [id number] making him aware of the 2 call attempts and letter sent to attempt to engage Mark Le for [facility] services without success. THN RN CM inquired if Dr Nevada Crane thought [facility] visits may be more accepting for Mark Le. THN RN discussed the [facility] call attempt workflow. [facility] RN CM encouraged a return call from MD to determine how MD would like to proceed. THN RN CM left THN RN CM office number    [facility] RN CM called to Dr Nevada Crane office [phone] and left a voice message at prompt #1 after not receiving a staff member.   [facility] RN CM reviewed the unsuccessful call attempts x 2 to Mark Le after the 10/22/19 MD referral. [facility] RN CM mentioned Mark Le is not preferring telephonic screening/assessment and THN  was not completing home visits at this time related to covid pandemic.   [facility] RN CM will pend Mark Le for the final call attempt within the next 4-7 business days and await a return call from Dr Juel Burrow office for guidance related to engagement or another plan of care for this patient  Routed note to MD  [place]. Lavina Hamman, RN, BSN, [place] Coordinator Office number [phone] Mobile number [phone]  Main THN number [phone] Fax number [phone]

## 2019-11-08 DIAGNOSIS — D631 Anemia in chronic kidney disease: Secondary | ICD-10-CM | POA: Diagnosis not present

## 2019-11-08 DIAGNOSIS — N1831 Chronic kidney disease, stage 3a: Secondary | ICD-10-CM | POA: Diagnosis not present

## 2019-11-08 DIAGNOSIS — K7030 Alcoholic cirrhosis of liver without ascites: Secondary | ICD-10-CM | POA: Diagnosis not present

## 2019-11-08 DIAGNOSIS — E1122 Type 2 diabetes mellitus with diabetic chronic kidney disease: Secondary | ICD-10-CM | POA: Diagnosis not present

## 2019-11-08 DIAGNOSIS — E440 Moderate protein-calorie malnutrition: Secondary | ICD-10-CM | POA: Diagnosis not present

## 2019-11-08 DIAGNOSIS — E782 Mixed hyperlipidemia: Secondary | ICD-10-CM | POA: Diagnosis not present

## 2019-11-08 DIAGNOSIS — E875 Hyperkalemia: Secondary | ICD-10-CM | POA: Diagnosis not present

## 2019-11-08 DIAGNOSIS — R634 Abnormal weight loss: Secondary | ICD-10-CM | POA: Diagnosis not present

## 2019-11-08 DIAGNOSIS — Z7982 Long term (current) use of aspirin: Secondary | ICD-10-CM | POA: Diagnosis not present

## 2019-11-08 DIAGNOSIS — I129 Hypertensive chronic kidney disease with stage 1 through stage 4 chronic kidney disease, or unspecified chronic kidney disease: Secondary | ICD-10-CM | POA: Diagnosis not present

## 2019-11-11 ENCOUNTER — Other Ambulatory Visit: Payer: Self-pay | Admitting: *Deleted

## 2019-11-11 ENCOUNTER — Other Ambulatory Visit: Payer: Self-pay

## 2019-11-11 NOTE — Patient Outreach (Signed)
[place] [facility]) Care Management  11/11/2019  CALAHAN PAK 06/20/1935 [id number]   [facility] third outreach to MD referred patient and [facility] case closure    Mr KAIYON HYNES was referred to [facility] on 10/22/19 by Dr Celene Squibb office for referral reason: for SW   Mr Zukas has medicare and blue cross blue shield coverage   This [facility] RN CM attempted to engage Mr Wandrey on 10/29/19, 11/04/19 and 11/11/19 to complete the [facility] telephonic assessment/screening that needs to be completed prior to referring him to [place] work (SW) and other further [facility] RN CM care coordination but Mr Mccabe reported each time he was not comfortable, "did not want to talk on the telephone". THN RN CM explained to him that [facility] services are confidentially telephonic during the covid pandemic. He would only verify his name during these call attempts and/or disconnect the calls at times. A THN unsuccessful outreach letter was sent on 10/29/19 without return call from Mr Mccauslin  [facility] RN CM spoke with the MD staff Amber on 10/30/19 about this concern with [facility] engagement. THN RN CM routed each of [facility] RN CM notes and left a message to update the MD office on  11/04/19. On 11/04/19 [facility] RN CM suggested in the MD office voice message that Mr Christoffel may benefit better from assistance of home health service orders  Today 11/11/19 [facility] RN CM outreached to Mr Gunderman for the third time to attempt to engage with him prior to [facility] case closure process. [facility] RN CM provided introductions and inquired of Mr Babers if possible home services may have been initiated.  Mr Biby informs [facility] RN CM that he has begun to receive visits from home health staff He reports visits on Sunday 11/08/19 by one staff member and Monday 11/09/19 by 2 staff members.  He was not able initially to tell [place] the name of the agency but was able to after [facility] RN CM discussed the home health agency package. He was able to tell [place] then that the agency was [facility]. THN RN  CM discussed appreciation for Mr Harpole engaging with [facility] RN CM today and the benefits of home health services.   Mr Gentles was then agreeable to verify HIPAA today He is able to verify HIPAA ([place]) identifiers, date of birth (DOB) and address  THN RN CM again discussed [facility] services, care coordination and [facility] SW services. Mr Reap states he is not sure of all the titles of the [facility] staff that have visited him but he feels "they pretty much have it covered."  [facility] RN CM inquired if [facility] RN CM could possibly continue to outreach to him every 1-3 months to continue care coordination but Mr Kant confirms he "will be okay with them and I think they can cover it"  [facility] RN CM voiced understanding. THN RN CM reminded Mr Groesbeck of the previous [facility] letter he received from [place] with [facility] RN CM contact information and encouraged him to reach out to [facility] RN CM prn. THN RN CM discussed case closure and the letter he and his MD would receive He thanked [facility] RN CM, voiced understanding and appreciation of services rendered. He reports he will call if needed but agreed to [facility] case closure   [facility] RN CM will close case at this time as patient   Pt encouraged to return a call to [facility]  RN CM prn  Mr Arizpe and primary care provider (PCP) were sent case closure letters  Routed note to MDs/NP/PA    [facility] CM Care Plan Problem One     Most Recent Value  Care Plan Problem One  Not willing to participate with [facility] telephonic services   Role Documenting the Problem One  Care Management Telephonic Coordinator  Care Plan for Problem One  Not Active  THN CM Short Term Goal #1   over the next 7 days patient will receive home services preferred as verbalized during outreach   [facility] CM Short Term Goal #1 Start Date  11/04/19  [facility] CM Short Term Goal #1 Met Date  11/11/19      Joelene Millin L. Lavina Hamman, RN, BSN, [place] Coordinator Office number [phone] Mobile number [phone]  Main THN number [phone] Fax number [phone]

## 2019-11-29 NOTE — Progress Notes (Signed)
Cardiology Office Note  Date: 11/30/2019   ID: [name], DOB 1934-09-17, MRN [id number]  PCP:  Mark Squibb, MD  Cardiologist:  Rozann Lesches, MD Electrophysiologist:  None   Chief Complaint  Patient presents with  . Cardiac follow-up    History of Present Illness: Mark Le is an 85 y.o. male last seen in November 2020.  He is here for a routine visit.  He does not report any significant chest pain or unusual shortness of breath with typical ADLs.  Has not had any significant lightheadedness or syncope.  He tends to run a low blood pressure, but is tolerating the current cardiac regimen which I reviewed below.  He continues to follow with Dr. Nevada Le for primary care.  I reviewed his last lab work from July 2020.  He is due for follow-up lipids this year.  Past Medical History:  Diagnosis Date  . Aneurysm, thoracic aortic (Mark Le) 08/2016   4.2cm  . CAD (coronary artery disease)    Evidence of previous inferior infarct by Myoview 2018  . Cirrhosis (Mark Le)    Hep A and Hep B labs are negative. pt recieved Hep A and Hep B vaccines at [facility] in [place].   . Colon cancer (Mark Le) 2002   T3 N2 tumor with 2 of 4 positive lymph nodes, s/p resection and colostomy and post-op chemotherapy. reversal of colostomy 2003.   Mark Le Essential hypertension   . Gout 06/04/2011  . Hyperlipidemia   . Type 2 diabetes mellitus (Mark Le)   . Ventral hernia 07/24/2011    Past Surgical History:  Procedure Laterality Date  . CATARACT EXTRACTION W/PHACO Left 09/11/2016   Procedure: CATARACT EXTRACTION PHACO AND INTRAOCULAR LENS PLACEMENT (IOC);  Surgeon: Rutherford Guys, MD;  Location: [facility];  Service: Ophthalmology;  Laterality: Left;  CDE: 19.23  . CATARACT EXTRACTION W/PHACO Right 10/09/2016   Procedure: CATARACT EXTRACTION PHACO AND INTRAOCULAR LENS PLACEMENT (IOC);  Surgeon: Rutherford Guys, MD;  Location: [facility];  Service: Ophthalmology;  Laterality: Right;  CDE: 8.08  . COLONOSCOPY  11/2002   Dr. Alphonsa Overall: anastomosis at 45cm, otherwise unremarkable.  . COLONOSCOPY  05/2006   Dr. Alphonsa Overall: 4 polyps removed.  . COLONOSCOPY  07/2009   Dr. Alphonsa Overall: 2 tubular adenomas, next TCS five years.   . COLONOSCOPY N/A 06/20/2016   Dr. Gala Le multiple colonic polyps removed, several in piecemeal fashion, a ascending colon polyp tubular adenoma with focal high-grade dysplasia, cecal polyps tubulovillous adenoma. Next colonoscopy June 2018  . COLONOSCOPY N/A 12/27/2016   Dr. Gala Le: Internal hemorrhoids, grade 1.  3 semi-pedunculated polyps in the ascending colon, removed.  Polyp in the appendiceal orifice (tubulovillous adenoma), sessile, piecemeal removal, not complete.  8 mm polyp in the ascending colon  . COLOSTOMY REVERSAL  01/2002   Dr. Alphonsa Overall  . ESOPHAGOGASTRODUODENOSCOPY N/A 06/20/2016   Dr. Gala Le: Large hiatal hernia, esophagus normal  . LAPAROSCOPIC APPENDECTOMY N/A 04/26/2017   Procedure: APPENDECTOMY LAPAROSCOPIC;  Surgeon: Mark Boston, MD;  [facility]: [facility];  Service: General;  Laterality: N/A;  . LYSIS OF ADHESION N/A 04/26/2017   Procedure: LYSIS OF ADHESION;  Surgeon: Mark Boston, MD;  [facility];  Service: General;  Laterality: N/A;  . PARTIAL COLECTOMY  04/2001   Dr. Alphonsa Overall: obstructing colon carcinoma of the left colon with resection and colostomy  . VENTRAL HERNIA REPAIR  04/2005   Dr. Alphonsa Overall    Current Outpatient Medications  Medication Sig Dispense Refill  .  aspirin 81 MG tablet Take 81 mg by mouth daily.     . carvedilol (COREG) 12.5 MG tablet Take 12.5 mg by mouth 2 (two) times daily with a meal.      . fenofibrate (TRICOR) 145 MG tablet Take 72.5 mg by mouth daily. Takes 1/2 tablet daily    . lisinopril (ZESTRIL) 10 MG tablet Take 1 tablet (10 mg total) by mouth daily. 90 tablet 3  . Multiple Vitamins-Minerals (ONE-A-DAY MENS 50+ ADVANTAGE) TABS Take 1 tablet by mouth daily.    . simvastatin (ZOCOR) 20 MG tablet Take 20 mg by mouth at  bedtime.      No current facility-administered medications for this visit.   Allergies:  Iohexol   ROS:  Hearing loss.  Physical Exam: VS:  BP 98/60   Pulse 64   Ht 6\' 2"  (1.88 m)   Wt 166 lb (75.3 kg)   SpO2 97%   BMI 21.31 kg/m , BMI Body mass index is 21.31 kg/m.  Wt Readings from Last 3 Encounters:  11/30/19 166 lb (75.3 kg)  05/29/19 167 lb (75.8 kg)  03/05/19 176 lb 3.2 oz (79.9 kg)    General:  Elderly male, appears comfortable at rest. HEENT: Conjunctiva and lids normal, wearing a mask. Neck: Supple, no elevated JVP or carotid bruits, no thyromegaly. Lungs: Clear to auscultation, nonlabored breathing at rest. Cardiac: Regular rate and rhythm, no S3, soft systolic murmur, no pericardial rub. Abdomen: Soft, bowel sounds present, no guarding or rebound. Extremities: Ankle edema, distal pulses 2+.  ECG:  An ECG dated 09/06/2016 was personally reviewed today and demonstrated:  Sinus rhythm with prolonged PR interval, rule out old inferior infarct pattern, low voltage.  Recent Labwork: 02/02/2019: ALT 10; AST 20; BUN 22; Creat 1.33; Hemoglobin 12.7; Platelets 278; Potassium 4.8; Sodium 143   Other Studies Reviewed Today:  Chest CT 04/08/2019: FINDINGS: Cardiovascular: Aortic atherosclerosis. Extensive coronary artery calcification. Slight dilatation of the ascending thoracic aorta to a diameter 4.1 cm, unchanged since the prior study. Overall heart size is normal. No pericardial effusion.  Mediastinum/Nodes: No enlarged mediastinal or axillary lymph nodes. Thyroid gland, trachea, and esophagus demonstrate no significant findings.  Lungs/Pleura: Lungs are clear. No pleural effusion or pneumothorax.  Upper Abdomen: Chronic cholelithiasis. Stable small right adrenal lipoma.  Musculoskeletal: No chest wall mass or suspicious bone lesions identified.  IMPRESSION: 1. No acute abnormalities of the chest. 2. Extensive coronary artery calcifications. 3. Stable  mild dilatation of the ascending thoracic aorta to a diameter of 4.1 cm. 4. Chronic cholelithiasis. 5. Stable small right adrenal lipoma.  Aortic Atherosclerosis (ICD10-I70.0).  Lexiscan Myoview 02/19/2017:  Findings consistent with prior myocardial infarction.  This is a low risk study.  The left ventricular ejection fraction is normal (55-65%).  There was no ST segment deviation noted during stress.  Small inferior wall infarct from apex to base with no ischemia EF estimated 65% with no RWMAls suggest echo correlation  Echocardiogram 03/04/2017: Study Conclusions  - Left ventricle: The cavity size was normal. Wall thickness was increased in a pattern of mild LVH. Systolic function was normal. The estimated ejection fraction was in the range of 55% to 60%. Wall motion was normal; there were no regional wall motion abnormalities. Doppler parameters are consistent with abnormal left ventricular relaxation (grade 1 diastolic dysfunction). - Aortic valve: Mildly calcified leaflets. Sclerosis without stenosis. There was trivial regurgitation.  Assessment and Plan:  1.  Ischemic heart disease based on previous Myoview with evidence of inferior wall  scar but no active ischemia.  We continue conservative management in the absence of angina symptoms.  He is on aspirin, Coreg, lisinopril, and Zocor.  2.  Mixed hyperlipidemia, continue Zocor.  Check lipids with PCP this year with goal LDL 70 or less.  3.  Mild dilatation of the ascending thoracic aorta, 4.1 cm by CT in September 2020.  He is asymptomatic.  Medication Adjustments/Labs and Tests Ordered: Current medicines are reviewed at length with the patient today.  Concerns regarding medicines are outlined above.   Tests Ordered: No orders of the defined types were placed in this encounter.   Medication Changes: Meds ordered this encounter  Medications  . lisinopril (ZESTRIL) 10 MG tablet    Sig: Take 1 tablet  (10 mg total) by mouth daily.    Dispense:  90 tablet    Refill:  3    05/29/2019 dose DECREASED to 10 mg qd    Disposition:  Follow up 6 months in the [place] office.  Signed, Satira Sark, MD, [facility] 11/30/2019 10:15 AM    [employer] at [place]. [address], [place], [place] [address] Phone: [phone]; Fax: [phone]

## 2019-11-30 ENCOUNTER — Encounter: Payer: Self-pay | Admitting: Cardiology

## 2019-11-30 ENCOUNTER — Ambulatory Visit (INDEPENDENT_AMBULATORY_CARE_PROVIDER_SITE_OTHER): Payer: Medicare Other | Admitting: Cardiology

## 2019-11-30 ENCOUNTER — Other Ambulatory Visit: Payer: Self-pay

## 2019-11-30 VITALS — BP 98/60 | HR 64 | Ht 74.0 in | Wt 166.0 lb

## 2019-11-30 DIAGNOSIS — I259 Chronic ischemic heart disease, unspecified: Secondary | ICD-10-CM

## 2019-11-30 DIAGNOSIS — I77810 Thoracic aortic ectasia: Secondary | ICD-10-CM

## 2019-11-30 DIAGNOSIS — E782 Mixed hyperlipidemia: Secondary | ICD-10-CM | POA: Diagnosis not present

## 2019-11-30 MED ORDER — LISINOPRIL 10 MG PO TABS: 10.0000 mg | ORAL_TABLET | Freq: Every day | ORAL | 3 refills | Status: DC

## 2019-11-30 NOTE — Patient Instructions (Signed)
Medication Instructions:  °Your physician recommends that you continue on your current medications as directed. Please refer to the Current Medication list given to you today. ° °*If you need a refill on your cardiac medications before your next appointment, please call your pharmacy* ° ° °Lab Work: °None today °If you have labs (blood work) drawn today and your tests are completely normal, you will receive your results only by: °• MyChart Message (if you have MyChart) OR °• A paper copy in the mail °If you have any lab test that is abnormal or we need to change your treatment, we will call you to review the results. ° ° °Testing/Procedures: °None today ° ° °Follow-Up: °At [facility], you and your health needs are our priority.  As part of our continuing mission to provide you with exceptional heart care, we have created designated Provider Care Teams.  These Care Teams include your primary Cardiologist (physician) and Advanced Practice Providers (APPs -  Physician Assistants and Nurse Practitioners) who all work together to provide you with the care you need, when you need it. ° °We recommend signing up for the patient portal called "MyChart".  Sign up information is provided on this After Visit Summary.  MyChart is used to connect with patients for Virtual Visits (Telemedicine).  Patients are able to view lab/test results, encounter notes, upcoming appointments, etc.  Non-urgent messages can be sent to your provider as well.   °To learn more about what you can do with MyChart, go to [email or web address].   ° °Your next appointment:   °6 month(s) ° °The format for your next appointment:   °In Person ° °Provider:   °Samuel McDowell, MD ° ° °Other Instructions °None ° ° ° ° °Thank you for choosing [facility] ! ° ° ° ° ° ° ° ° °

## 2020-01-29 DIAGNOSIS — D631 Anemia in chronic kidney disease: Secondary | ICD-10-CM | POA: Diagnosis not present

## 2020-01-29 DIAGNOSIS — I129 Hypertensive chronic kidney disease with stage 1 through stage 4 chronic kidney disease, or unspecified chronic kidney disease: Secondary | ICD-10-CM | POA: Diagnosis not present

## 2020-01-29 DIAGNOSIS — E440 Moderate protein-calorie malnutrition: Secondary | ICD-10-CM | POA: Diagnosis not present

## 2020-01-29 DIAGNOSIS — Z7982 Long term (current) use of aspirin: Secondary | ICD-10-CM | POA: Diagnosis not present

## 2020-01-29 DIAGNOSIS — R634 Abnormal weight loss: Secondary | ICD-10-CM | POA: Diagnosis not present

## 2020-01-29 DIAGNOSIS — E875 Hyperkalemia: Secondary | ICD-10-CM | POA: Diagnosis not present

## 2020-01-29 DIAGNOSIS — N1831 Chronic kidney disease, stage 3a: Secondary | ICD-10-CM | POA: Diagnosis not present

## 2020-01-29 DIAGNOSIS — E1122 Type 2 diabetes mellitus with diabetic chronic kidney disease: Secondary | ICD-10-CM | POA: Diagnosis not present

## 2020-01-29 DIAGNOSIS — E782 Mixed hyperlipidemia: Secondary | ICD-10-CM | POA: Diagnosis not present

## 2020-01-29 DIAGNOSIS — K7030 Alcoholic cirrhosis of liver without ascites: Secondary | ICD-10-CM | POA: Diagnosis not present

## 2020-02-25 DIAGNOSIS — N183 Chronic kidney disease, stage 3 unspecified: Secondary | ICD-10-CM | POA: Diagnosis not present

## 2020-02-25 DIAGNOSIS — Z6826 Body mass index (BMI) 26.0-26.9, adult: Secondary | ICD-10-CM | POA: Diagnosis not present

## 2020-02-25 DIAGNOSIS — E1022 Type 1 diabetes mellitus with diabetic chronic kidney disease: Secondary | ICD-10-CM | POA: Diagnosis not present

## 2020-02-25 DIAGNOSIS — Z86010 Personal history of colonic polyps: Secondary | ICD-10-CM | POA: Diagnosis not present

## 2020-02-25 DIAGNOSIS — I129 Hypertensive chronic kidney disease with stage 1 through stage 4 chronic kidney disease, or unspecified chronic kidney disease: Secondary | ICD-10-CM | POA: Diagnosis not present

## 2020-02-25 DIAGNOSIS — E782 Mixed hyperlipidemia: Secondary | ICD-10-CM | POA: Diagnosis not present

## 2020-02-25 DIAGNOSIS — D509 Iron deficiency anemia, unspecified: Secondary | ICD-10-CM | POA: Diagnosis not present

## 2020-02-25 DIAGNOSIS — E1140 Type 2 diabetes mellitus with diabetic neuropathy, unspecified: Secondary | ICD-10-CM | POA: Diagnosis not present

## 2020-02-25 DIAGNOSIS — D649 Anemia, unspecified: Secondary | ICD-10-CM | POA: Diagnosis not present

## 2020-02-25 DIAGNOSIS — N1831 Chronic kidney disease, stage 3a: Secondary | ICD-10-CM | POA: Diagnosis not present

## 2020-02-25 DIAGNOSIS — E7849 Other hyperlipidemia: Secondary | ICD-10-CM | POA: Diagnosis not present

## 2020-02-25 DIAGNOSIS — E441 Mild protein-calorie malnutrition: Secondary | ICD-10-CM | POA: Diagnosis not present

## 2020-03-02 DIAGNOSIS — E440 Moderate protein-calorie malnutrition: Secondary | ICD-10-CM | POA: Diagnosis not present

## 2020-03-02 DIAGNOSIS — I10 Essential (primary) hypertension: Secondary | ICD-10-CM | POA: Diagnosis not present

## 2020-03-02 DIAGNOSIS — N1831 Chronic kidney disease, stage 3a: Secondary | ICD-10-CM | POA: Diagnosis not present

## 2020-03-02 DIAGNOSIS — D649 Anemia, unspecified: Secondary | ICD-10-CM | POA: Diagnosis not present

## 2020-03-02 DIAGNOSIS — K7030 Alcoholic cirrhosis of liver without ascites: Secondary | ICD-10-CM | POA: Diagnosis not present

## 2020-03-02 DIAGNOSIS — E782 Mixed hyperlipidemia: Secondary | ICD-10-CM | POA: Diagnosis not present

## 2020-03-02 DIAGNOSIS — E1122 Type 2 diabetes mellitus with diabetic chronic kidney disease: Secondary | ICD-10-CM | POA: Diagnosis not present

## 2020-03-02 DIAGNOSIS — R460 Very low level of personal hygiene: Secondary | ICD-10-CM | POA: Diagnosis not present

## 2020-04-07 DIAGNOSIS — Z23 Encounter for immunization: Secondary | ICD-10-CM | POA: Diagnosis not present

## 2020-04-14 DIAGNOSIS — I129 Hypertensive chronic kidney disease with stage 1 through stage 4 chronic kidney disease, or unspecified chronic kidney disease: Secondary | ICD-10-CM | POA: Diagnosis not present

## 2020-04-14 DIAGNOSIS — E1122 Type 2 diabetes mellitus with diabetic chronic kidney disease: Secondary | ICD-10-CM | POA: Diagnosis not present

## 2020-04-14 DIAGNOSIS — R460 Very low level of personal hygiene: Secondary | ICD-10-CM | POA: Diagnosis not present

## 2020-04-14 DIAGNOSIS — K7030 Alcoholic cirrhosis of liver without ascites: Secondary | ICD-10-CM | POA: Diagnosis not present

## 2020-04-14 DIAGNOSIS — I10 Essential (primary) hypertension: Secondary | ICD-10-CM | POA: Diagnosis not present

## 2020-04-14 DIAGNOSIS — E7849 Other hyperlipidemia: Secondary | ICD-10-CM | POA: Diagnosis not present

## 2020-04-14 DIAGNOSIS — R0602 Shortness of breath: Secondary | ICD-10-CM | POA: Diagnosis not present

## 2020-04-14 DIAGNOSIS — E440 Moderate protein-calorie malnutrition: Secondary | ICD-10-CM | POA: Diagnosis not present

## 2020-04-14 DIAGNOSIS — N1831 Chronic kidney disease, stage 3a: Secondary | ICD-10-CM | POA: Diagnosis not present

## 2020-04-14 DIAGNOSIS — D649 Anemia, unspecified: Secondary | ICD-10-CM | POA: Diagnosis not present

## 2020-05-04 DIAGNOSIS — Z23 Encounter for immunization: Secondary | ICD-10-CM | POA: Diagnosis not present

## 2020-05-16 ENCOUNTER — Encounter: Payer: Self-pay | Admitting: Cardiology

## 2020-05-16 ENCOUNTER — Ambulatory Visit (INDEPENDENT_AMBULATORY_CARE_PROVIDER_SITE_OTHER): Payer: Medicare Other | Admitting: Cardiology

## 2020-05-16 VITALS — BP 122/68 | HR 58 | Ht 74.0 in | Wt 163.2 lb

## 2020-05-16 DIAGNOSIS — I259 Chronic ischemic heart disease, unspecified: Secondary | ICD-10-CM | POA: Diagnosis not present

## 2020-05-16 DIAGNOSIS — I77810 Thoracic aortic ectasia: Secondary | ICD-10-CM

## 2020-05-16 NOTE — Patient Instructions (Signed)

## 2020-05-16 NOTE — Progress Notes (Signed)
Cardiology Office Note  Date: 05/16/2020   ID: Mark Le, DOB Apr 25, 1935, MRN [id number]  PCP:  Celene Squibb, MD  Cardiologist:  Rozann Lesches, MD Electrophysiologist:  None   Chief Complaint  Patient presents with  . Cardiac follow-up    History of Present Illness: Mark Le is an 85 y.o. male last seen in May.  He presents for a routine follow-up visit.  Since last encounter he does not report any angina symptoms with activity, NYHA class II dyspnea.  No palpitations or syncope.  He continues to follow regularly with Dr. Nevada Crane for primary care.  I reviewed his lab work from September of last year.  LDL was 75 at that time.  I reviewed his medications are listed below.  I personally reviewed his ECG which shows sinus bradycardia with prolonged PR interval, low voltage.  Past Medical History:  Diagnosis Date  . Aneurysm, thoracic aortic ([place]) 08/2016   4.2cm  . CAD (coronary artery disease)    Evidence of previous inferior infarct by Myoview 2018  . Cirrhosis ([place])    Hep A and Hep B labs are negative. pt recieved Hep A and Hep B vaccines at [facility] in [place].   . Colon cancer ([place]) 2002   T3 N2 tumor with 2 of 4 positive lymph nodes, s/p resection and colostomy and post-op chemotherapy. reversal of colostomy 2003.   Marland Kitchen Essential hypertension   . Gout 06/04/2011  . Hyperlipidemia   . Type 2 diabetes mellitus ([place])   . Ventral hernia 07/24/2011    Past Surgical History:  Procedure Laterality Date  . CATARACT EXTRACTION W/PHACO Left 09/11/2016   Procedure: CATARACT EXTRACTION PHACO AND INTRAOCULAR LENS PLACEMENT (IOC);  Surgeon: Rutherford Guys, MD;  Location: [facility];  Service: Ophthalmology;  Laterality: Left;  CDE: 19.23  . CATARACT EXTRACTION W/PHACO Right 10/09/2016   Procedure: CATARACT EXTRACTION PHACO AND INTRAOCULAR LENS PLACEMENT (IOC);  Surgeon: Rutherford Guys, MD;  Location: [facility];  Service: Ophthalmology;  Laterality: Right;  CDE: 8.08  . COLONOSCOPY   11/2002   Dr. Alphonsa Overall: anastomosis at 45cm, otherwise unremarkable.  . COLONOSCOPY  05/2006   Dr. Alphonsa Overall: 4 polyps removed.  . COLONOSCOPY  07/2009   Dr. Alphonsa Overall: 2 tubular adenomas, next TCS five years.   . COLONOSCOPY N/A 06/20/2016   Dr. Gala Romney multiple colonic polyps removed, several in piecemeal fashion, a ascending colon polyp tubular adenoma with focal high-grade dysplasia, cecal polyps tubulovillous adenoma. Next colonoscopy June 2018  . COLONOSCOPY N/A 12/27/2016   Dr. Gala Romney: Internal hemorrhoids, grade 1.  3 semi-pedunculated polyps in the ascending colon, removed.  Polyp in the appendiceal orifice (tubulovillous adenoma), sessile, piecemeal removal, not complete.  8 mm polyp in the ascending colon  . COLOSTOMY REVERSAL  01/2002   Dr. Alphonsa Overall  . ESOPHAGOGASTRODUODENOSCOPY N/A 06/20/2016   Dr. Gala Romney: Large hiatal hernia, esophagus normal  . LAPAROSCOPIC APPENDECTOMY N/A 04/26/2017   Procedure: APPENDECTOMY LAPAROSCOPIC;  Surgeon: Michael Boston, MD;  [facility];  Service: General;  Laterality: N/A;  . LYSIS OF ADHESION N/A 04/26/2017   Procedure: LYSIS OF ADHESION;  Surgeon: Michael Boston, MD;  [facility];  Service: General;  Laterality: N/A;  . PARTIAL COLECTOMY  04/2001   Dr. Alphonsa Overall: obstructing colon carcinoma of the left colon with resection and colostomy  . VENTRAL HERNIA REPAIR  04/2005   Dr. Alphonsa Overall    Current Outpatient Medications  Medication Sig Dispense Refill  .  aspirin 81 MG tablet Take 81 mg by mouth daily.     . carvedilol (COREG) 12.5 MG tablet Take 12.5 mg by mouth 2 (two) times daily with a meal.      . lisinopril (ZESTRIL) 10 MG tablet Take 1 tablet (10 mg total) by mouth daily. 90 tablet 3  . Multiple Vitamins-Minerals (ONE-A-DAY MENS 50+ ADVANTAGE) TABS Take 1 tablet by mouth daily.    . simvastatin (ZOCOR) 20 MG tablet Take 20 mg by mouth at bedtime.      No current facility-administered medications for this visit.     Allergies:  Iohexol   ROS: No palpitations or syncope.  Physical Exam: VS:  BP 122/68   Pulse (!) 58   Ht 6\' 2"  (1.88 m)   Wt 163 lb 3.2 oz (74 kg)   SpO2 93%   BMI 20.95 kg/m , BMI Body mass index is 20.95 kg/m.  Wt Readings from Last 3 Encounters:  05/16/20 163 lb 3.2 oz (74 kg)  11/30/19 166 lb (75.3 kg)  05/29/19 167 lb (75.8 kg)    General: Elderly male, appears comfortable at rest. HEENT: Conjunctiva and lids normal, wearing a mask. Neck: Supple, no elevated JVP or carotid bruits, no thyromegaly. Lungs: Clear to auscultation, nonlabored breathing at rest. Cardiac: Regular rate and rhythm, no S3 or significant systolic murmur. Abdomen: Soft, bowel sounds present. Extremities: Trace ankle edema, distal pulses 2+.  ECG:  An ECG dated 09/06/2016 was personally reviewed today and demonstrated:  Sinus rhythm with prolonged PR interval, rule out old inferior infarct pattern, low voltage.  Recent Labwork:  September 2020: Hemoglobin 12.1, platelets 235, BUN 22, creatinine 1.33, potassium 4.5, AST 20, ALT 14, cholesterol 132, triglycerides 95, HDL 39, LDL 75, hemoglobin A1c 5.1%  Other Studies Reviewed Today:  Lexiscan Myoview 02/19/2017:  Findings consistent with prior myocardial infarction.  This is a low risk study.  The left ventricular ejection fraction is normal (55-65%).  There was no ST segment deviation noted during stress.  Small inferior wall infarct from apex to base with no ischemia EF estimated 65% with no RWMAls suggest echo correlation  Echocardiogram 03/04/2017: Study Conclusions  - Left ventricle: The cavity size was normal. Wall thickness was increased in a pattern of mild LVH. Systolic function was normal. The estimated ejection fraction was in the range of 55% to 60%. Wall motion was normal; there were no regional wall motion abnormalities. Doppler parameters are consistent with abnormal left ventricular relaxation (grade 1  diastolic dysfunction). - Aortic valve: Mildly calcified leaflets. Sclerosis without stenosis. There was trivial regurgitation.  Chest CT 04/08/2019: FINDINGS: Cardiovascular: Aortic atherosclerosis. Extensive coronary artery calcification. Slight dilatation of the ascending thoracic aorta to a diameter 4.1 cm, unchanged since the prior study. Overall heart size is normal. No pericardial effusion.  Mediastinum/Nodes: No enlarged mediastinal or axillary lymph nodes. Thyroid gland, trachea, and esophagus demonstrate no significant findings.  Lungs/Pleura: Lungs are clear. No pleural effusion or pneumothorax.  Upper Abdomen: Chronic cholelithiasis. Stable small right adrenal lipoma.  Musculoskeletal: No chest wall mass or suspicious bone lesions identified.  IMPRESSION: 1. No acute abnormalities of the chest. 2. Extensive coronary artery calcifications. 3. Stable mild dilatation of the ascending thoracic aorta to a diameter of 4.1 cm. 4. Chronic cholelithiasis. 5. Stable small right adrenal lipoma.  Aortic Atherosclerosis (ICD10-I70.0).  Assessment and Plan:  1.  Ischemic heart disease by previous noninvasive cardiac imaging, evidence of inferior wall scar but no ischemia by Myoview.  We will continue  conservative medical therapy and observation in the absence of angina symptoms.  He is currently on aspirin, Coreg, lisinopril, and Zocor.  ECG reviewed.  2.  Mildly dilated ascending thoracic aorta, 4.1 cm by chest CT in December 2020.  He is asymptomatic.  Consider follow-up imaging next year.  Medication Adjustments/Labs and Tests Ordered: Current medicines are reviewed at length with the patient today.  Concerns regarding medicines are outlined above.   Tests Ordered: Orders Placed This Encounter  Procedures  . EKG 12-Lead    Medication Changes: No orders of the defined types were placed in this encounter.   Disposition:  Follow up 1 year in the [place]  office.  Signed, Satira Sark, MD, [facility] 05/16/2020 10:26 AM    [place] at [place], [place], [place] [address] Phone: [phone]; Fax: [phone]

## 2020-05-19 IMAGING — US US ABDOMEN LIMITED
1 series · 14 of 25 positions shown · non-contrast
Comparison: Ultrasound 08/08/2017

CLINICAL DATA: Cirrhosis

EXAM:
ULTRASOUND ABDOMEN LIMITED RIGHT UPPER QUADRANT

[Series 1: us abdomen limited · 0.18mm/px · 14 of 76 slices shown]
[im 1/76]
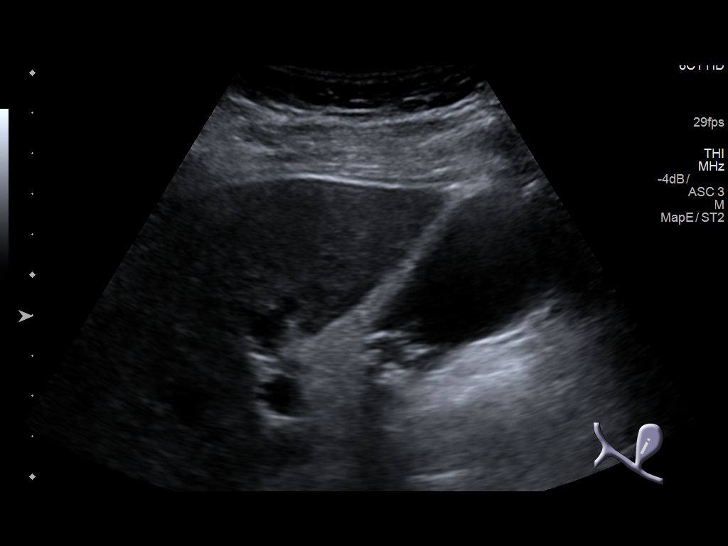
[im 7/76]
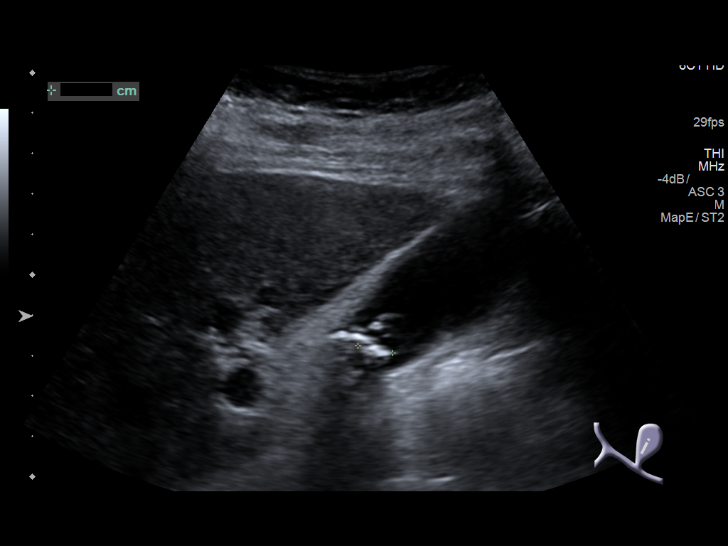
[im 13/76]
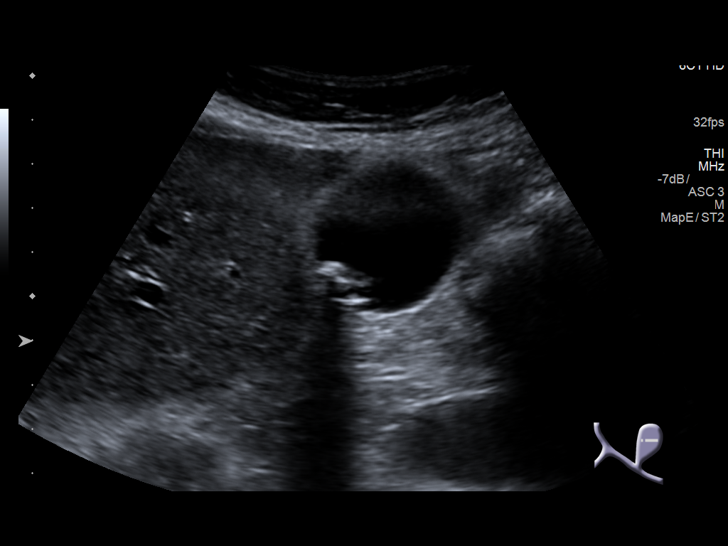
[im 19/76]
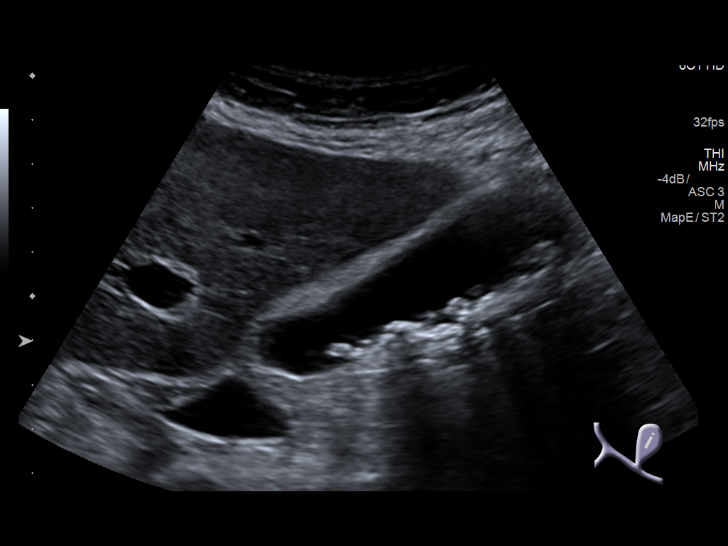
[im 26/76]
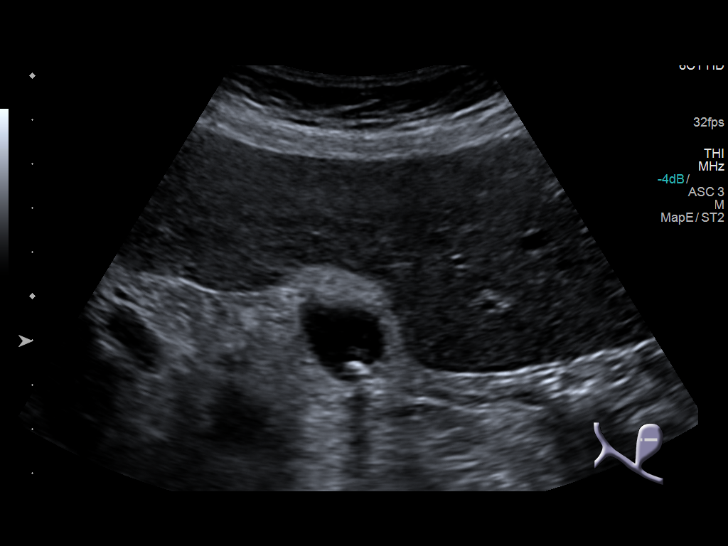
[im 29/76]
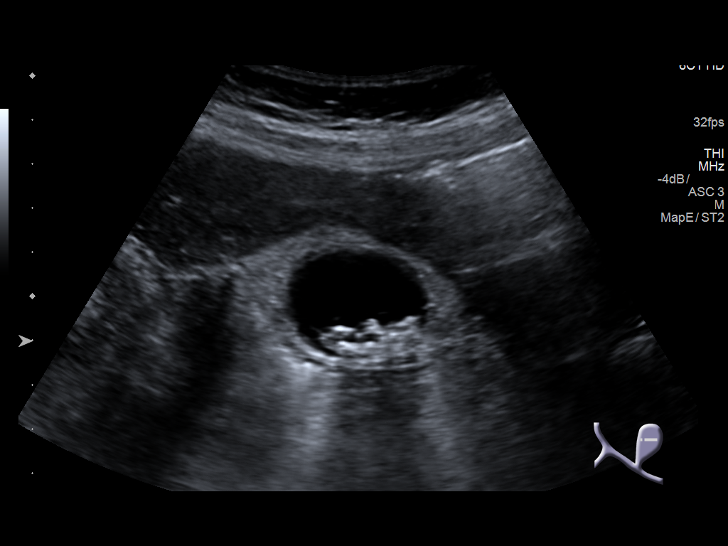
[im 35/76]
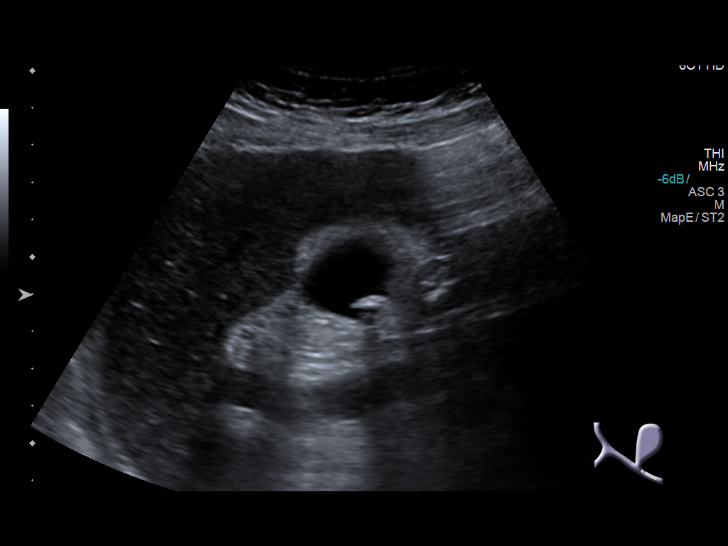
[im 41/76]
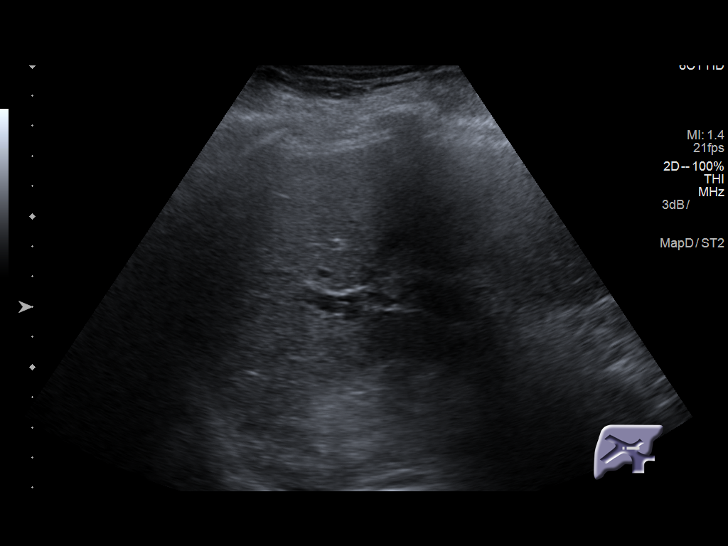
[im 47/76]
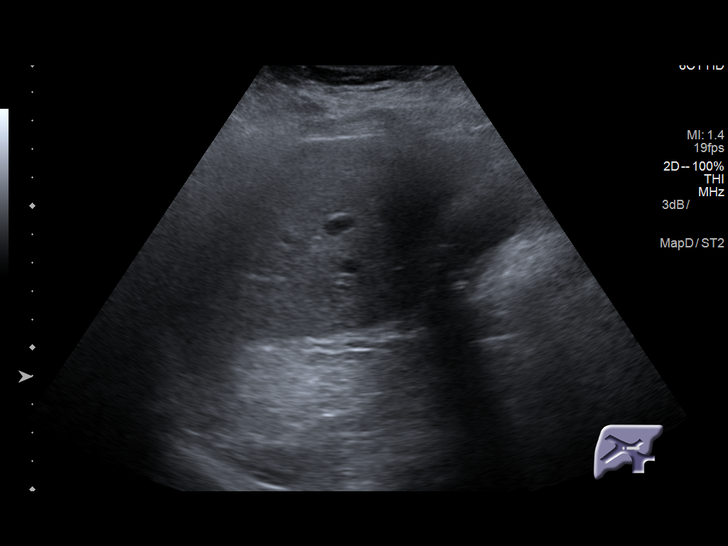
[im 51/76]
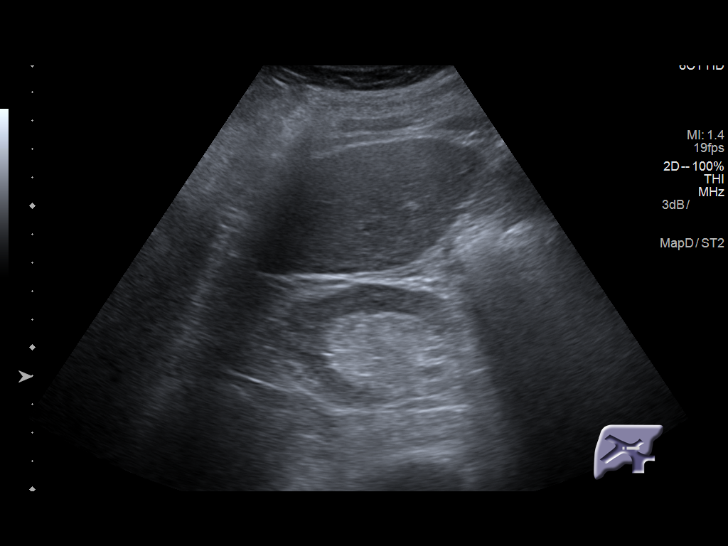
[im 57/76]
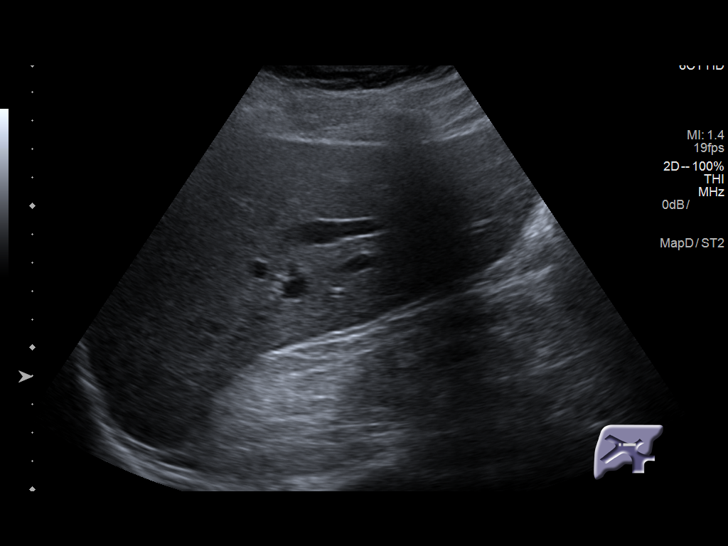
[im 63/76]
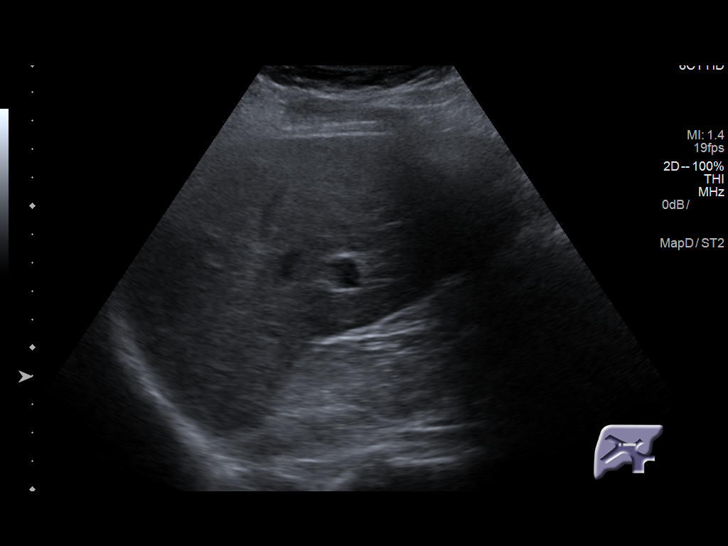
[im 69/76]
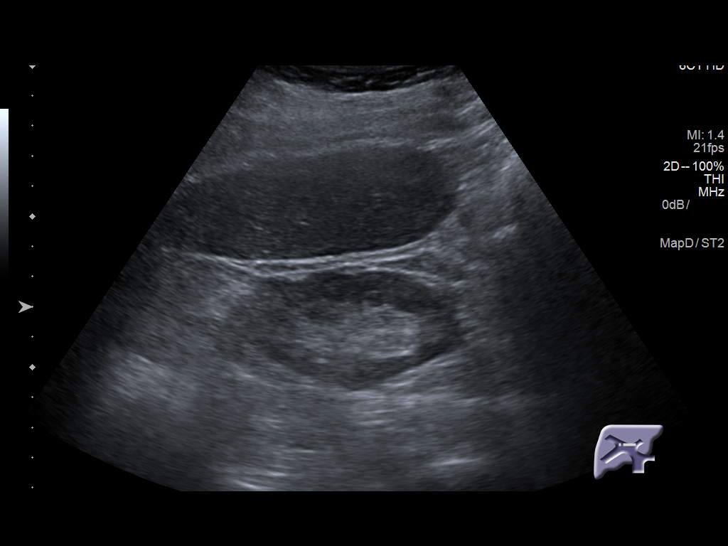
[im 76/76]
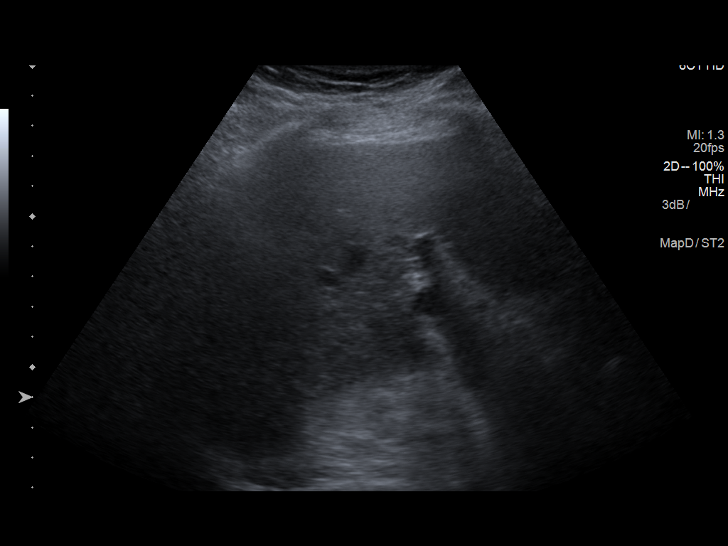

[14 of 25 positions shown; findings below may reference images not displayed]

FINDINGS: Gallbladder:

Multiple small mobile stones. Gallbladder wall is thickened at 6 mm.
Negative sonographic Murphy's sign.

Common bile duct:

Diameter: Normal caliber, 2 mm

Liver:

Heterogeneous echotexture throughout the liver with nodular contours
paddle with given history of cirrhosis. No focal hepatic
abnormality. No biliary ductal dilatation. Portal vein is patent on
color Doppler imaging with normal direction of blood flow towards
the liver.
IMPRESSION: Changes of cirrhosis.  No focal hepatic abnormality.

Cholelithiasis. Gallbladder wall thickening may be related to
chronic cholecystitis or liver disease.

## 2020-07-06 DIAGNOSIS — N183 Chronic kidney disease, stage 3 unspecified: Secondary | ICD-10-CM | POA: Diagnosis not present

## 2020-07-06 DIAGNOSIS — E1140 Type 2 diabetes mellitus with diabetic neuropathy, unspecified: Secondary | ICD-10-CM | POA: Diagnosis not present

## 2020-07-06 DIAGNOSIS — E441 Mild protein-calorie malnutrition: Secondary | ICD-10-CM | POA: Diagnosis not present

## 2020-07-06 DIAGNOSIS — D649 Anemia, unspecified: Secondary | ICD-10-CM | POA: Diagnosis not present

## 2020-07-06 DIAGNOSIS — E1022 Type 1 diabetes mellitus with diabetic chronic kidney disease: Secondary | ICD-10-CM | POA: Diagnosis not present

## 2020-07-06 DIAGNOSIS — D509 Iron deficiency anemia, unspecified: Secondary | ICD-10-CM | POA: Diagnosis not present

## 2020-07-06 DIAGNOSIS — N1831 Chronic kidney disease, stage 3a: Secondary | ICD-10-CM | POA: Diagnosis not present

## 2020-07-06 DIAGNOSIS — E7849 Other hyperlipidemia: Secondary | ICD-10-CM | POA: Diagnosis not present

## 2020-07-06 DIAGNOSIS — E782 Mixed hyperlipidemia: Secondary | ICD-10-CM | POA: Diagnosis not present

## 2020-07-06 DIAGNOSIS — Z6826 Body mass index (BMI) 26.0-26.9, adult: Secondary | ICD-10-CM | POA: Diagnosis not present

## 2020-07-06 DIAGNOSIS — Z86010 Personal history of colonic polyps: Secondary | ICD-10-CM | POA: Diagnosis not present

## 2020-07-06 DIAGNOSIS — I129 Hypertensive chronic kidney disease with stage 1 through stage 4 chronic kidney disease, or unspecified chronic kidney disease: Secondary | ICD-10-CM | POA: Diagnosis not present

## 2020-07-11 DIAGNOSIS — N1831 Chronic kidney disease, stage 3a: Secondary | ICD-10-CM | POA: Diagnosis not present

## 2020-07-11 DIAGNOSIS — R460 Very low level of personal hygiene: Secondary | ICD-10-CM | POA: Diagnosis not present

## 2020-07-11 DIAGNOSIS — D649 Anemia, unspecified: Secondary | ICD-10-CM | POA: Diagnosis not present

## 2020-07-11 DIAGNOSIS — E782 Mixed hyperlipidemia: Secondary | ICD-10-CM | POA: Diagnosis not present

## 2020-07-11 DIAGNOSIS — E1122 Type 2 diabetes mellitus with diabetic chronic kidney disease: Secondary | ICD-10-CM | POA: Diagnosis not present

## 2020-07-11 DIAGNOSIS — E440 Moderate protein-calorie malnutrition: Secondary | ICD-10-CM | POA: Diagnosis not present

## 2020-07-11 DIAGNOSIS — I10 Essential (primary) hypertension: Secondary | ICD-10-CM | POA: Diagnosis not present

## 2020-07-11 DIAGNOSIS — K7030 Alcoholic cirrhosis of liver without ascites: Secondary | ICD-10-CM | POA: Diagnosis not present

## 2020-09-12 DIAGNOSIS — N1831 Chronic kidney disease, stage 3a: Secondary | ICD-10-CM | POA: Diagnosis not present

## 2020-09-12 DIAGNOSIS — K7030 Alcoholic cirrhosis of liver without ascites: Secondary | ICD-10-CM | POA: Diagnosis not present

## 2020-09-12 DIAGNOSIS — R460 Very low level of personal hygiene: Secondary | ICD-10-CM | POA: Diagnosis not present

## 2020-09-12 DIAGNOSIS — E440 Moderate protein-calorie malnutrition: Secondary | ICD-10-CM | POA: Diagnosis not present

## 2020-09-12 DIAGNOSIS — E782 Mixed hyperlipidemia: Secondary | ICD-10-CM | POA: Diagnosis not present

## 2020-09-12 DIAGNOSIS — D649 Anemia, unspecified: Secondary | ICD-10-CM | POA: Diagnosis not present

## 2020-09-12 DIAGNOSIS — E1122 Type 2 diabetes mellitus with diabetic chronic kidney disease: Secondary | ICD-10-CM | POA: Diagnosis not present

## 2020-09-12 DIAGNOSIS — I10 Essential (primary) hypertension: Secondary | ICD-10-CM | POA: Diagnosis not present

## 2020-10-27 DIAGNOSIS — D509 Iron deficiency anemia, unspecified: Secondary | ICD-10-CM | POA: Diagnosis not present

## 2020-10-27 DIAGNOSIS — E7849 Other hyperlipidemia: Secondary | ICD-10-CM | POA: Diagnosis not present

## 2020-10-27 DIAGNOSIS — N1831 Chronic kidney disease, stage 3a: Secondary | ICD-10-CM | POA: Diagnosis not present

## 2020-10-27 DIAGNOSIS — E782 Mixed hyperlipidemia: Secondary | ICD-10-CM | POA: Diagnosis not present

## 2020-10-27 DIAGNOSIS — I10 Essential (primary) hypertension: Secondary | ICD-10-CM | POA: Diagnosis not present

## 2020-10-27 DIAGNOSIS — E1022 Type 1 diabetes mellitus with diabetic chronic kidney disease: Secondary | ICD-10-CM | POA: Diagnosis not present

## 2020-10-27 DIAGNOSIS — N183 Chronic kidney disease, stage 3 unspecified: Secondary | ICD-10-CM | POA: Diagnosis not present

## 2020-10-27 DIAGNOSIS — E875 Hyperkalemia: Secondary | ICD-10-CM | POA: Diagnosis not present

## 2020-10-27 DIAGNOSIS — I129 Hypertensive chronic kidney disease with stage 1 through stage 4 chronic kidney disease, or unspecified chronic kidney disease: Secondary | ICD-10-CM | POA: Diagnosis not present

## 2020-10-27 DIAGNOSIS — E1140 Type 2 diabetes mellitus with diabetic neuropathy, unspecified: Secondary | ICD-10-CM | POA: Diagnosis not present

## 2020-10-27 DIAGNOSIS — E1122 Type 2 diabetes mellitus with diabetic chronic kidney disease: Secondary | ICD-10-CM | POA: Diagnosis not present

## 2020-11-02 DIAGNOSIS — E440 Moderate protein-calorie malnutrition: Secondary | ICD-10-CM | POA: Diagnosis not present

## 2020-11-02 DIAGNOSIS — D649 Anemia, unspecified: Secondary | ICD-10-CM | POA: Diagnosis not present

## 2020-11-02 DIAGNOSIS — E782 Mixed hyperlipidemia: Secondary | ICD-10-CM | POA: Diagnosis not present

## 2020-11-02 DIAGNOSIS — K7030 Alcoholic cirrhosis of liver without ascites: Secondary | ICD-10-CM | POA: Diagnosis not present

## 2020-11-02 DIAGNOSIS — N1831 Chronic kidney disease, stage 3a: Secondary | ICD-10-CM | POA: Diagnosis not present

## 2020-11-02 DIAGNOSIS — R460 Very low level of personal hygiene: Secondary | ICD-10-CM | POA: Diagnosis not present

## 2020-11-02 DIAGNOSIS — E1122 Type 2 diabetes mellitus with diabetic chronic kidney disease: Secondary | ICD-10-CM | POA: Diagnosis not present

## 2020-11-02 DIAGNOSIS — I10 Essential (primary) hypertension: Secondary | ICD-10-CM | POA: Diagnosis not present

## 2021-02-16 ENCOUNTER — Other Ambulatory Visit: Payer: Self-pay | Admitting: Cardiology

## 2021-04-07 DIAGNOSIS — G3184 Mild cognitive impairment, so stated: Secondary | ICD-10-CM | POA: Diagnosis not present

## 2021-04-07 DIAGNOSIS — N390 Urinary tract infection, site not specified: Secondary | ICD-10-CM | POA: Diagnosis not present

## 2021-05-29 DIAGNOSIS — L309 Dermatitis, unspecified: Secondary | ICD-10-CM | POA: Diagnosis not present

## 2021-05-29 DIAGNOSIS — B369 Superficial mycosis, unspecified: Secondary | ICD-10-CM | POA: Diagnosis not present

## 2021-06-13 DIAGNOSIS — B351 Tinea unguium: Secondary | ICD-10-CM | POA: Diagnosis not present

## 2021-06-13 DIAGNOSIS — L600 Ingrowing nail: Secondary | ICD-10-CM | POA: Diagnosis not present

## 2021-06-13 DIAGNOSIS — B353 Tinea pedis: Secondary | ICD-10-CM | POA: Diagnosis not present

## 2021-06-13 DIAGNOSIS — M79675 Pain in left toe(s): Secondary | ICD-10-CM | POA: Diagnosis not present

## 2021-06-13 DIAGNOSIS — M79674 Pain in right toe(s): Secondary | ICD-10-CM | POA: Diagnosis not present

## 2021-08-04 DIAGNOSIS — Z9841 Cataract extraction status, right eye: Secondary | ICD-10-CM | POA: Diagnosis not present

## 2021-08-04 DIAGNOSIS — Z9842 Cataract extraction status, left eye: Secondary | ICD-10-CM | POA: Diagnosis not present

## 2021-08-04 DIAGNOSIS — H353134 Nonexudative age-related macular degeneration, bilateral, advanced atrophic with subfoveal involvement: Secondary | ICD-10-CM | POA: Diagnosis not present

## 2021-08-04 DIAGNOSIS — H52222 Regular astigmatism, left eye: Secondary | ICD-10-CM | POA: Diagnosis not present

## 2021-08-04 LAB — HM DIABETES EYE EXAM

## 2021-10-16 ENCOUNTER — Ambulatory Visit (INDEPENDENT_AMBULATORY_CARE_PROVIDER_SITE_OTHER): Payer: Medicare Other | Admitting: Family Medicine

## 2021-10-16 ENCOUNTER — Encounter: Payer: Self-pay | Admitting: Family Medicine

## 2021-10-16 VITALS — BP 110/60 | HR 64 | Temp 97.9°F | Ht 72.2 in | Wt 176.1 lb

## 2021-10-16 DIAGNOSIS — G3184 Mild cognitive impairment, so stated: Secondary | ICD-10-CM

## 2021-10-16 DIAGNOSIS — E781 Pure hyperglyceridemia: Secondary | ICD-10-CM | POA: Diagnosis not present

## 2021-10-16 DIAGNOSIS — I10 Essential (primary) hypertension: Secondary | ICD-10-CM

## 2021-10-16 DIAGNOSIS — K7460 Unspecified cirrhosis of liver: Secondary | ICD-10-CM | POA: Diagnosis not present

## 2021-10-16 DIAGNOSIS — E119 Type 2 diabetes mellitus without complications: Secondary | ICD-10-CM | POA: Diagnosis not present

## 2021-10-16 DIAGNOSIS — F0390 Unspecified dementia without behavioral disturbance: Secondary | ICD-10-CM | POA: Insufficient documentation

## 2021-10-16 LAB — COMPREHENSIVE METABOLIC PANEL
ALT: 6 U/L (ref 0–53)
AST: 15 U/L (ref 0–37)
Albumin: 3.5 g/dL (ref 3.5–5.2)
Alkaline Phosphatase: 74 U/L (ref 39–117)
BUN: 11 mg/dL (ref 6–23)
CO2: 33 mEq/L — ABNORMAL HIGH (ref 19–32)
Calcium: 9.2 mg/dL (ref 8.4–10.5)
Chloride: 102 mEq/L (ref 96–112)
Creatinine, Ser: 0.99 mg/dL (ref 0.40–1.50)
GFR: 69.73 mL/min (ref >60.00)
Glucose, Bld: 112 mg/dL — ABNORMAL HIGH (ref 70–99)
Potassium: 4.4 mEq/L (ref 3.5–5.1)
Sodium: 141 mEq/L (ref 135–145)
Total Bilirubin: 0.6 mg/dL (ref 0.2–1.2)
Total Protein: 6.1 g/dL (ref 6.0–8.3)

## 2021-10-16 LAB — CBC WITH DIFFERENTIAL/PLATELET
Basophils Absolute: 0 10*3/uL (ref 0.0–0.1)
Basophils Relative: 0.9 % (ref 0.0–3.0)
Eosinophils Absolute: 0.3 10*3/uL (ref 0.0–0.7)
Eosinophils Relative: 6.2 % — ABNORMAL HIGH (ref 0.0–5.0)
HCT: 34.3 % — ABNORMAL LOW (ref 39.0–52.0)
Hemoglobin: 11.7 g/dL — ABNORMAL LOW (ref 13.0–17.0)
Lymphocytes Relative: 18.9 % (ref 12.0–46.0)
Lymphs Abs: 0.8 10*3/uL (ref 0.7–4.0)
MCHC: 34.1 g/dL (ref 30.0–36.0)
MCV: 98.2 fl (ref 78.0–100.0)
Monocytes Absolute: 0.3 10*3/uL (ref 0.1–1.0)
Monocytes Relative: 8.2 % (ref 3.0–12.0)
Neutro Abs: 2.8 10*3/uL (ref 1.4–7.7)
Neutrophils Relative %: 65.8 % (ref 43.0–77.0)
Platelets: 281 10*3/uL (ref 150.0–400.0)
RBC: 3.49 Mil/uL — ABNORMAL LOW (ref 4.22–5.81)
RDW: 13.1 % (ref 11.5–15.5)
WBC: 4.2 10*3/uL (ref 4.0–10.5)

## 2021-10-16 LAB — VITAMIN B12: Vitamin B-12: 285 pg/mL (ref 211–911)

## 2021-10-16 LAB — POCT GLYCOSYLATED HEMOGLOBIN (HGB A1C): Hemoglobin A1C: 5.2 % (ref 4.0–5.6)

## 2021-10-16 LAB — LIPID PANEL
Cholesterol: 129 mg/dL (ref 0–200)
HDL: 33.2 mg/dL — ABNORMAL LOW (ref >39.00)
LDL Cholesterol: 65 mg/dL (ref 0–99)
NonHDL: 95.86
Total CHOL/HDL Ratio: 4
Triglycerides: 156 mg/dL — ABNORMAL HIGH (ref 0.0–149.0)
VLDL: 31.2 mg/dL (ref 0.0–40.0)

## 2021-10-16 LAB — PROTIME-INR
INR: 1.2 ratio — ABNORMAL HIGH (ref 0.8–1.0)
Prothrombin Time: 13.3 s — ABNORMAL HIGH (ref 9.6–13.1)

## 2021-10-16 LAB — TSH: TSH: 3.54 u[IU]/mL (ref 0.35–5.50)

## 2021-10-16 MED ORDER — DONEPEZIL HCL 10 MG PO TABS
10.0000 mg | ORAL_TABLET | Freq: Every day | ORAL | 0 refills | Status: DC
Start: 2021-10-16 — End: 2022-01-12

## 2021-10-16 MED ORDER — CARVEDILOL 12.5 MG PO TABS: 12.5000 mg | ORAL_TABLET | Freq: Two times a day (BID) | ORAL | 1 refills | Status: DC

## 2021-10-16 MED ORDER — LISINOPRIL 10 MG PO TABS: 10.0000 mg | ORAL_TABLET | Freq: Every day | ORAL | 1 refills | Status: DC

## 2021-10-16 MED ORDER — SIMVASTATIN 20 MG PO TABS: 20.0000 mg | ORAL_TABLET | Freq: Every day | ORAL | 1 refills | Status: DC

## 2021-10-16 NOTE — Assessment & Plan Note (Signed)
BP controlled. Cont lisinopril 10 mg and carvedilol 12.5 mg BID.  ?

## 2021-10-16 NOTE — Patient Instructions (Addendum)
Memory ?- Labs today ?- New prescription 10 mg of Donepezil (memory medication) ?- referral to neurology ? ?Diabetes ?- continue diet ? ?Blood pressure ?- looks good ? ?Bring copy advanced directive  ? ? ?

## 2021-10-16 NOTE — Progress Notes (Addendum)
? ?Subjective:  ? ?  ?Mark Le is a 87 y.o. male presenting for Diabetes ?  ? ? ?HPI ? ?#Diabetes ?Diet controlled ?Last HgbA1c:  ?Lab Results  ?Component Value Date  ? HGBA1C 5.2 10/16/2021  ? ? ?Diabetes Health Maintenance Due:  ?  ?Diabetes Health Maintenance Due  ?Topic Date Due  ? FOOT EXAM  Never done  ? OPHTHALMOLOGY EXAM  Never done  ? HEMOGLOBIN A1C  04/17/2022  ? ?#Confusion ?- on donepezil ?- started in the fall ?- is confused today but otherwise normal ?- no diagnosis of dementia ?- has noticed some paranoia ?- upset by the weather ? ? ? ?Review of Systems ? ? ?Social History  ? ?Tobacco Use  ?Smoking Status Former  ? Packs/day: 1.50  ? Years: 15.00  ? Pack years: 22.50  ? Types: Cigarettes  ? Quit date: 09/06/1980  ? Years since quitting: 41.1  ?Smokeless Tobacco Never  ? ? ? ?   ?Objective:  ?  ?BP Readings from Last 3 Encounters:  ?10/16/21 110/60  ?05/16/20 122/68  ?11/30/19 98/60  ? ?Wt Readings from Last 3 Encounters:  ?10/16/21 176 lb 2 oz (79.9 kg)  ?05/16/20 163 lb 3.2 oz (74 kg)  ?11/30/19 166 lb (75.3 kg)  ? ? ?BP 110/60   Pulse 64   Temp 97.9 ?F (36.6 ?C) (Oral)   Ht 6' 0.2" (1.834 m)   Wt 176 lb 2 oz (79.9 kg)   SpO2 98%   BMI 23.75 kg/m?  ? ? ?Physical Exam ?Constitutional:   ?   Appearance: Normal appearance. He is not ill-appearing or diaphoretic.  ?HENT:  ?   Right Ear: External ear normal.  ?   Left Ear: External ear normal.  ?   Nose: Nose normal.  ?Eyes:  ?   General: No scleral icterus. ?   Extraocular Movements: Extraocular movements intact.  ?   Conjunctiva/sclera: Conjunctivae normal.  ?Cardiovascular:  ?   Rate and Rhythm: Normal rate and regular rhythm.  ?Pulmonary:  ?   Effort: Pulmonary effort is normal. No respiratory distress.  ?   Breath sounds: Normal breath sounds. No wheezing.  ?Abdominal:  ?   General: Abdomen is flat. Bowel sounds are normal. There is no distension.  ?   Palpations: Abdomen is soft.  ?   Tenderness: There is no abdominal tenderness. There is  no guarding or rebound.  ?Musculoskeletal:  ?   Cervical back: Neck supple.  ?Skin: ?   General: Skin is warm and dry.  ?Neurological:  ?   Mental Status: He is alert. Mental status is at baseline.  ?Psychiatric:     ?   Mood and Affect: Mood normal.  ? ? ? ? ? ?   ?Assessment & Plan:  ? ?Problem List Items Addressed This Visit   ? ?  ? Cardiovascular and Mediastinum  ? HYPERTENSION, BENIGN SYSTEMIC  ?  BP controlled. Cont lisinopril 10 mg and carvedilol 12.5 mg BID.  ?  ?  ? Relevant Medications  ? lisinopril (ZESTRIL) 10 MG tablet  ? simvastatin (ZOCOR) 20 MG tablet  ? carvedilol (COREG) 12.5 MG tablet  ?  ? Digestive  ? Hepatic cirrhosis ([place])  ?  Pt and sister unclear on diagnosis. Will get labs to assess staging. And consider imaging in the future. LFTs normal, though INR elevated. MELD 8.  ?  ?  ? Relevant Orders  ? Comprehensive metabolic panel (Completed)  ? CBC with Differential (Completed)  ?  Protime-INR (Completed)  ?  ? Endocrine  ? DM (diabetes mellitus) - diet controlled - Primary  ?  Lab Results  ?Component Value Date  ? HGBA1C 5.2 10/16/2021  ?Controlled on diet. Cont healthy diet.  ? ?  ?  ? Relevant Medications  ? lisinopril (ZESTRIL) 10 MG tablet  ? simvastatin (ZOCOR) 20 MG tablet  ? Other Relevant Orders  ? POCT glycosylated hemoglobin (Hb A1C) (Completed)  ?  ? Nervous and Auditory  ? Mild cognitive impairment  ?  Started in September for mild confusion. Cont donepezil. Labs today. Neurology referral ?  ?  ? Relevant Orders  ? Vitamin B12 (Completed)  ? TSH (Completed)  ? RPR  ? Ambulatory referral to Neurology  ?  ? Other  ? HYPERTRIGLYCERIDEMIA  ?  Labs today. Cont simvastatin 20 mg ?  ?  ? Relevant Medications  ? lisinopril (ZESTRIL) 10 MG tablet  ? simvastatin (ZOCOR) 20 MG tablet  ? carvedilol (COREG) 12.5 MG tablet  ? Other Relevant Orders  ? Lipid panel (Completed)  ? ? ? ?Return in about 6 weeks (around 11/27/2021) for memory evaluation. ? ?Lesleigh Noe, MD ? ?This visit occurred  during the SARS-CoV-2 public health emergency.  Safety protocols were in place, including screening questions prior to the visit, additional usage of staff PPE, and extensive cleaning of exam room while observing appropriate contact time as indicated for disinfecting solutions.  ? ?

## 2021-10-16 NOTE — Assessment & Plan Note (Signed)
Labs today. Cont simvastatin 20 mg ?

## 2021-10-16 NOTE — Assessment & Plan Note (Signed)
Lab Results  ?Component Value Date  ? HGBA1C 5.2 10/16/2021  ?Controlled on diet. Cont healthy diet.  ? ?

## 2021-10-16 NOTE — Assessment & Plan Note (Addendum)
Pt and sister unclear on diagnosis. Will get labs to assess staging. And consider imaging in the future. LFTs normal, though INR elevated. MELD 8.  ?

## 2021-10-16 NOTE — Assessment & Plan Note (Addendum)
Started in September for mild confusion. Cont donepezil. Labs today. Neurology referral ?

## 2021-10-17 ENCOUNTER — Other Ambulatory Visit: Payer: Self-pay | Admitting: Family Medicine

## 2021-10-17 DIAGNOSIS — K7460 Unspecified cirrhosis of liver: Secondary | ICD-10-CM

## 2021-10-17 LAB — RPR: RPR Ser Ql: NONREACTIVE

## 2021-10-17 NOTE — Progress Notes (Signed)
Referral to GI ? ?Please call patient and advise he come for ammonia check due to worsening confusion - if he is acutely confused he should go to the ER.  ?

## 2021-10-19 ENCOUNTER — Other Ambulatory Visit (INDEPENDENT_AMBULATORY_CARE_PROVIDER_SITE_OTHER): Payer: Medicare Other

## 2021-10-19 DIAGNOSIS — K7460 Unspecified cirrhosis of liver: Secondary | ICD-10-CM

## 2021-10-19 LAB — AMMONIA: Ammonia: 31 umol/L (ref 11–35)

## 2021-10-19 NOTE — Progress Notes (Signed)
Spoke to pt's sister, Meryl Crutch (DPR), and notified her of GI referral. Pt will be in today for labs.  ?

## 2021-11-27 ENCOUNTER — Ambulatory Visit (INDEPENDENT_AMBULATORY_CARE_PROVIDER_SITE_OTHER): Payer: Medicare Other | Admitting: Family Medicine

## 2021-11-27 VITALS — BP 110/60 | HR 54 | Temp 97.3°F | Wt 175.5 lb

## 2021-11-27 DIAGNOSIS — E538 Deficiency of other specified B group vitamins: Secondary | ICD-10-CM

## 2021-11-27 DIAGNOSIS — F03B0 Unspecified dementia, moderate, without behavioral disturbance, psychotic disturbance, mood disturbance, and anxiety: Secondary | ICD-10-CM

## 2021-11-27 DIAGNOSIS — R208 Other disturbances of skin sensation: Secondary | ICD-10-CM | POA: Insufficient documentation

## 2021-11-27 MED ORDER — VITAMIN B-12 1000 MCG PO TABS: 1000.0000 ug | ORAL_TABLET | Freq: Every day | ORAL | 1 refills | Status: DC

## 2021-11-27 NOTE — Assessment & Plan Note (Addendum)
Some improvement with Aricept. [place] 17/30 today. Has upcoming neurology appt. Ordered MRI today. Reviewed prior labs with normal TSH and low normal B12.  ?

## 2021-11-27 NOTE — Progress Notes (Signed)
? ?Subjective:  ? ?  ?Mark Le is a 87 y.o. male presenting for Follow-up (6 wk- memory ) ?  ? ? ?HPI ? ?#Vibration sensation ?- feels a vibration sensation starting at the feet ?- starts at the feet and travels  ?- feels like the whole bed is shaking ?- has a tremor in the left hand but otherwise no shaking ?- will get ringing in the left ear ? ?- notes he has cold intolerance chills frequently - when the air comes on, ears will get cold, has to wear a long sleeve shirt all the time ? ?- scared often ?- wants the windows shut early at nighttime ? ? ?Review of Systems ? ? ?Social History  ? ?Tobacco Use  ?Smoking Status Former  ? Packs/day: 1.50  ? Years: 15.00  ? Pack years: 22.50  ? Types: Cigarettes  ? Quit date: 09/06/1980  ? Years since quitting: 41.2  ?Smokeless Tobacco Never  ? ? ? ?   ?Objective:  ?  ?BP Readings from Last 3 Encounters:  ?11/27/21 110/60  ?10/16/21 110/60  ?05/16/20 122/68  ? ?Wt Readings from Last 3 Encounters:  ?11/27/21 175 lb 8 oz (79.6 kg)  ?10/16/21 176 lb 2 oz (79.9 kg)  ?05/16/20 163 lb 3.2 oz (74 kg)  ? ? ?BP 110/60   Pulse (!) 54   Temp (!) 97.3 ?F (36.3 ?C) (Temporal)   Wt 175 lb 8 oz (79.6 kg)   SpO2 98%   BMI 23.67 kg/m?  ? ? ?Physical Exam ?Constitutional:   ?   Appearance: Normal appearance. He is not ill-appearing or diaphoretic.  ?HENT:  ?   Right Ear: External ear normal.  ?   Left Ear: External ear normal.  ?   Nose: Nose normal.  ?Eyes:  ?   General: No scleral icterus. ?   Extraocular Movements: Extraocular movements intact.  ?   Conjunctiva/sclera: Conjunctivae normal.  ?Cardiovascular:  ?   Rate and Rhythm: Normal rate.  ?Pulmonary:  ?   Effort: Pulmonary effort is normal.  ?Musculoskeletal:  ?   Cervical back: Neck supple.  ?   Right lower leg: Edema (2+ to mid calf) present.  ?   Left lower leg: Edema (2+ to mid calf) present.  ?Skin: ?   General: Skin is warm and dry.  ?Neurological:  ?   Mental Status: He is alert. Mental status is at baseline.  ?    Comments: No obvious tremor  ?Psychiatric:     ?   Mood and Affect: Mood normal.  ? ? ? ?  11/27/2021  ? 11:49 AM  ?Montreal Cognitive Assessment   ?Visuospatial/ Executive (0/5) 3  ?Naming (0/3) 2  ?Attention: Read list of digits (0/2) 2  ?Attention: Read list of letters (0/1) 1  ?Attention: Serial 7 subtraction starting at 100 (0/3) 1  ?Language: Repeat phrase (0/2) 2  ?Language : Fluency (0/1) 1  ?Abstraction (0/2) 1  ?Delayed Recall (0/5) 1  ?Orientation (0/6) 3  ?Total 17  ? ? ? ? ?   ?Assessment & Plan:  ? ?Problem List Items Addressed This Visit   ? ?  ? Nervous and Auditory  ? Dementia ([place]) - Primary  ?  Some improvement with Aricept. [place] 17/30 today. Has upcoming neurology appt. Ordered MRI today. Reviewed prior labs with normal TSH and low normal B12.  ? ?  ?  ? Relevant Orders  ? MR Brain Wo Contrast  ?  ? Other  ?  Decreased sense of vibration  ?  Etiology unclear. He notes sensation of vibration w/o any tremor. Discussed trial of B12 supplement to see if this will help. Has neurology f/u in 2 months - advised discussing there.  ? ?  ?  ? Vitamin B12 deficiency  ? Relevant Medications  ? vitamin B-12 (CYANOCOBALAMIN) 1000 MCG tablet  ? ? ? ?Return in about 3 months (around 02/15/2022). ? ?Lesleigh Noe, MD ? ? ? ?

## 2021-11-27 NOTE — Patient Instructions (Addendum)
Liver - appointment with GI - Dr. Vicente Males ? ?Memory - appointment with neurology  ? ?Vibration sensation ?- get a Vit B12 1000 mcg - over the counter ?- start taking this to see if if helps ? ?Leg swelling ?- wear compression socks daily ?

## 2021-11-27 NOTE — Assessment & Plan Note (Signed)
Etiology unclear. He notes sensation of vibration w/o any tremor. Discussed trial of B12 supplement to see if this will help. Has neurology f/u in 2 months - advised discussing there.  ?

## 2021-11-30 ENCOUNTER — Encounter: Payer: Self-pay | Admitting: Family Medicine

## 2022-01-12 ENCOUNTER — Other Ambulatory Visit: Payer: Self-pay | Admitting: Family Medicine

## 2022-01-30 DIAGNOSIS — H353134 Nonexudative age-related macular degeneration, bilateral, advanced atrophic with subfoveal involvement: Secondary | ICD-10-CM | POA: Diagnosis not present

## 2022-01-30 DIAGNOSIS — E538 Deficiency of other specified B group vitamins: Secondary | ICD-10-CM | POA: Diagnosis not present

## 2022-01-30 DIAGNOSIS — H3530 Unspecified macular degeneration: Secondary | ICD-10-CM | POA: Diagnosis not present

## 2022-01-30 DIAGNOSIS — R251 Tremor, unspecified: Secondary | ICD-10-CM | POA: Diagnosis not present

## 2022-01-30 DIAGNOSIS — H02403 Unspecified ptosis of bilateral eyelids: Secondary | ICD-10-CM | POA: Diagnosis not present

## 2022-01-30 DIAGNOSIS — M419 Scoliosis, unspecified: Secondary | ICD-10-CM | POA: Diagnosis not present

## 2022-02-02 ENCOUNTER — Other Ambulatory Visit: Payer: Self-pay | Admitting: Neurology

## 2022-02-02 DIAGNOSIS — R4189 Other symptoms and signs involving cognitive functions and awareness: Secondary | ICD-10-CM

## 2022-02-11 ENCOUNTER — Ambulatory Visit
Admission: RE | Admit: 2022-02-11 | Discharge: 2022-02-11 | Disposition: A | Discharge status: Home / Self Care | Payer: Medicare Other | Source: Ambulatory Visit | Attending: Neurology | Admitting: Neurology

## 2022-02-11 DIAGNOSIS — R3915 Urgency of urination: Secondary | ICD-10-CM | POA: Diagnosis not present

## 2022-02-11 DIAGNOSIS — R4189 Other symptoms and signs involving cognitive functions and awareness: Secondary | ICD-10-CM

## 2022-02-11 DIAGNOSIS — I6782 Cerebral ischemia: Secondary | ICD-10-CM | POA: Diagnosis not present

## 2022-02-11 DIAGNOSIS — R251 Tremor, unspecified: Secondary | ICD-10-CM | POA: Diagnosis not present

## 2022-02-12 ENCOUNTER — Other Ambulatory Visit: Payer: Self-pay

## 2022-02-12 ENCOUNTER — Ambulatory Visit: Payer: Medicare Other | Admitting: Gastroenterology

## 2022-02-12 ENCOUNTER — Encounter: Payer: Self-pay | Admitting: Gastroenterology

## 2022-02-12 VITALS — BP 110/64 | HR 66 | Temp 98.1°F | Wt 173.8 lb

## 2022-02-12 DIAGNOSIS — E538 Deficiency of other specified B group vitamins: Secondary | ICD-10-CM | POA: Diagnosis not present

## 2022-02-12 DIAGNOSIS — K7030 Alcoholic cirrhosis of liver without ascites: Secondary | ICD-10-CM

## 2022-02-12 DIAGNOSIS — I8510 Secondary esophageal varices without bleeding: Secondary | ICD-10-CM

## 2022-02-12 DIAGNOSIS — K7682 Hepatic encephalopathy: Secondary | ICD-10-CM

## 2022-02-12 NOTE — Progress Notes (Signed)
[name] MD, MRCP(U.K) [address]  [place]  [place], [place] [address]  Main: [phone]  Fax: [phone]   Gastroenterology Consultation  Referring Provider:     Lesleigh Noe, MD Primary Care Physician:  Lesleigh Noe, MD Primary Gastroenterologist:  Dr. [name]  Reason for Consultation:    Liver cirrhosis        HPI:   Mark Le is a 87 y.o. y/o male referred for consultation & management  by Dr. Einar Pheasant, Jobe Marker, MD.     He has been referred for liver cirrhosis.  Last seen in 03/05/2019 at [facility].  Previously determined that he has cirrhosis likely due to alcohol use for decades along with marijuana.  Not had a gastroenterology visit for over 3 years.  At that point of time he weighed around 176 pounds.  Last upper endoscopy was in December 2017: Large hiatal hernia was noted otherwise no esophageal varices. No recent right upper quadrant ultrasound. 10/16/2021 INR 1.2, TSH 3.5, hemoglobin 11.7 g, B12 level borderline low at 285.  At that point of time was having some issues of confusion and was referred to see [place].  Creatinine 0.99 total bilirubin 0.6 and albumin 3.5.  Transaminases are normal.   He is here with his sister.  Denies any alcohol use at this point of time.  Has some issues with memory.  Does not sleep very well at night.  Does not have a regular bowel movement.  Takes milk of magnesia to have adequate bowel movements.  No recent endoscopy.  No over-the-counter medications.  Past Medical History:  Diagnosis Date   Aneurysm, thoracic aortic ([place]) 08/2016   4.2cm   CAD (coronary artery disease)    Evidence of previous inferior infarct by Myoview 2018   Cirrhosis (HCC)    Hep A and Hep B labs are negative. pt recieved Hep A and Hep B vaccines at [facility] in [place].    Colon cancer ([place]) 2002   T3 N2 tumor with 2 of 4 positive lymph nodes, s/p resection and colostomy and post-op chemotherapy. reversal of  colostomy 2003.    Essential hypertension    Gout 06/04/2011   Gout 06/04/2011   Hyperlipidemia    Type 2 diabetes mellitus ([place])    Ventral hernia 07/24/2011    Past Surgical History:  Procedure Laterality Date   CATARACT EXTRACTION W/PHACO Left 09/11/2016   Procedure: CATARACT EXTRACTION PHACO AND INTRAOCULAR LENS PLACEMENT ([place]);  Surgeon: Rutherford Guys, MD;  Location: [facility];  Service: Ophthalmology;  Laterality: Left;  CDE: 19.23   CATARACT EXTRACTION W/PHACO Right 10/09/2016   Procedure: CATARACT EXTRACTION PHACO AND INTRAOCULAR LENS PLACEMENT ([facility]);  Surgeon: Rutherford Guys, MD;  Location: [facility];  Service: Ophthalmology;  Laterality: Right;  CDE: 8.08   COLONOSCOPY  11/2002   Dr. Alphonsa Overall: anastomosis at 45cm, otherwise unremarkable.   COLONOSCOPY  05/2006   Dr. Alphonsa Overall: 4 polyps removed.   COLONOSCOPY  07/2009   Dr. Alphonsa Overall: 2 tubular adenomas, next TCS five years.    COLONOSCOPY N/A 06/20/2016   Dr. Gala Romney multiple colonic polyps removed, several in piecemeal fashion, a ascending colon polyp tubular adenoma with focal high-grade dysplasia, cecal polyps tubulovillous adenoma. Next colonoscopy June 2018   COLONOSCOPY N/A 12/27/2016   Dr. Gala Romney: Internal hemorrhoids, grade 1.  3 semi-pedunculated polyps in the ascending colon, removed.  Polyp in the appendiceal orifice (tubulovillous adenoma), sessile, piecemeal removal, not complete.  8 mm  polyp in the ascending colon   COLOSTOMY REVERSAL  01/2002   Dr. Alphonsa Overall   ESOPHAGOGASTRODUODENOSCOPY N/A 06/20/2016   Dr. Gala Romney: Large hiatal hernia, esophagus normal   LAPAROSCOPIC APPENDECTOMY N/A 04/26/2017   Procedure: APPENDECTOMY LAPAROSCOPIC;  Surgeon: Michael Boston, MD;  Location: [facility];  Service: General;  Laterality: N/A;   LYSIS OF ADHESION N/A 04/26/2017   Procedure: LYSIS OF ADHESION;  Surgeon: Michael Boston, MD;  Location: [facility];  Service: General;  Laterality: N/A;   PARTIAL COLECTOMY  04/2001   Dr. Alphonsa Overall:  obstructing colon carcinoma of the left colon with resection and colostomy   VENTRAL HERNIA REPAIR  04/2005   Dr. Alphonsa Overall    Prior to Admission medications   Medication Sig Start Date End Date Taking? Authorizing Provider  aspirin 81 MG tablet Take 81 mg by mouth daily.     [provider]  carvedilol (COREG) 12.5 MG tablet Take 1 tablet (12.5 mg total) by mouth 2 (two) times daily with a meal. 10/16/21   Lesleigh Noe, MD  donepezil (ARICEPT) 10 MG tablet TAKE 1 TABLET BY MOUTH EVERY DAY 01/12/22   Lesleigh Noe, MD  lisinopril (ZESTRIL) 10 MG tablet Take 1 tablet (10 mg total) by mouth daily. 10/16/21   Lesleigh Noe, MD  Multiple Vitamins-Minerals (ONE-A-DAY MENS 50+ ADVANTAGE) TABS Take 1 tablet by mouth daily.    [provider]  simvastatin (ZOCOR) 20 MG tablet Take 1 tablet (20 mg total) by mouth at bedtime. 10/16/21   Lesleigh Noe, MD  vitamin B-12 (CYANOCOBALAMIN) 1000 MCG tablet Take 1 tablet (1,000 mcg total) by mouth daily. 11/27/21   Lesleigh Noe, MD    Family History  Problem Relation Age of Onset   Heart disease Father    Colon cancer Neg Hx      Social History   Tobacco Use   Smoking status: Former    Packs/day: 1.50    Years: 15.00    Total pack years: 22.50    Types: Cigarettes    Quit date: 09/06/1980    Years since quitting: 41.4   Smokeless tobacco: Never  Vaping Use   Vaping Use: Never used  Substance Use Topics   Alcohol use: No   Drug use: No    Allergies as of 02/12/2022 - Review Complete 02/12/2022  Allergen Reaction Noted   Iohexol Hives 05/21/2007    Review of Systems:    All systems reviewed and negative except where noted in HPI.   Physical Exam:  BP 110/64   Pulse 66   Temp 98.1 F (36.7 C) (Oral)   Wt 173 lb 12.8 oz (78.8 kg)   BMI 23.44 kg/m  No LMP for male patient. Psych:  Alert and cooperative. Normal mood and affect.  No liver flap General:   Alert,  Well-developed, well-nourished, pleasant and  cooperative in NAD Head:  Normocephalic and atraumatic. Eyes:  Sclera clear, no icterus.   Conjunctiva pink. Ears:  Normal auditory acuity.  Neurologic:  Alert and oriented x3;  grossly normal neurologically. Psych:  Alert and cooperative. Normal mood and affect.  Imaging Studies: No results found.  Assessment and Plan:   GAUGE WINSKI is a 87 y.o. y/o male who has a prior diagnosis of liver cirrhosis secondary to alcohol use used to be followed by [facility] last seen at their office over 3 years back.  Not known to have esophageal varices.  Overdue for health maintenance.  Recent episodes of confusion.  Could be due to episodes of hepatic encephalopathy.  Plan 1.  Right upper quad ultrasound to screen for [facility] along with AFP 2.  EGD to screen for esophageal varices. 3.  Lactulose titrated to 2 soft bowel movements per day.  Would avoid excessive usage of lactulose to the point of diarrhea as diarrhea can cause dehydration low electrolytes and worsen hepatic encephalopathy.  If continues to have hepatic encephalopathy despite lactulose then will need to add on Xifaxan at next visit. 4.  Epic shows that has not had a recent pneumococcal vaccine would need 1.  Tdap with his primary care physician.  Hepatitis A antibody was negative in 2019 hepatitis B surface antibody was also negative.  Recommended vaccines at that point of time.  I will check labs to determine if he is immune. 5.  He does have B12 deficiency if not at least borderline would recommend to continue B12 at 1000 mcg per oral per day which could also help to a degree if he has deficiency and causing him to have mental status changes.  I will recheck the levels today  I have discussed alternative options, risks & benefits,  which include, but are not limited to, bleeding, infection, perforation,respiratory complication & drug reaction.  The patient agrees with this plan & written consent will be obtained.       Follow up in 6 months  Dr [name] MD,MRCP(U.K)

## 2022-02-13 LAB — B12 AND FOLATE PANEL
Folate: 10.6 ng/mL (ref >3.0)
Vitamin B-12: 1104 pg/mL (ref 232–1245)

## 2022-02-13 LAB — HEPATITIS B SURFACE ANTIBODY,QUALITATIVE: Hep B Surface Ab, Qual: NONREACTIVE

## 2022-02-13 LAB — AFP TUMOR MARKER: AFP, Serum, Tumor Marker: <1.8 ng/mL (ref 0.0–6.4)

## 2022-02-13 LAB — HEPATITIS A ANTIBODY, TOTAL: hep A Total Ab: NEGATIVE

## 2022-02-13 MED ORDER — LACTULOSE 10 GM/15ML PO SOLN: 20.0000 g | Freq: Two times a day (BID) | ORAL | 3 refills | Status: DC

## 2022-02-13 NOTE — Addendum Note (Signed)
Addended by: Wayna Chalet on: 02/13/2022 10:44 AM   Modules accepted: Orders

## 2022-02-21 ENCOUNTER — Ambulatory Visit
Admission: RE | Admit: 2022-02-21 | Discharge: 2022-02-21 | Disposition: A | Discharge status: Home / Self Care | Payer: Medicare Other | Source: Ambulatory Visit | Attending: Gastroenterology | Admitting: Gastroenterology

## 2022-02-21 DIAGNOSIS — K7460 Unspecified cirrhosis of liver: Secondary | ICD-10-CM | POA: Diagnosis not present

## 2022-02-21 DIAGNOSIS — K7030 Alcoholic cirrhosis of liver without ascites: Secondary | ICD-10-CM | POA: Insufficient documentation

## 2022-02-27 ENCOUNTER — Ambulatory Visit (INDEPENDENT_AMBULATORY_CARE_PROVIDER_SITE_OTHER): Payer: Medicare Other | Admitting: *Deleted

## 2022-02-27 DIAGNOSIS — Z0000 Encounter for general adult medical examination without abnormal findings: Secondary | ICD-10-CM | POA: Diagnosis not present

## 2022-02-27 NOTE — Progress Notes (Signed)
Subjective:   Mark Le is a 87 y.o. male who presents for Medicare Annual/Subsequent preventive examination.  I connected with  Dede Query on 02/27/22 by a telephone enabled telemedicine application and verified that I am speaking with the correct person using two identifiers.   I discussed the limitations of evaluation and management by telemedicine. The patient expressed understanding and agreed to proceed.  Patient location: home  Provider location: Tele-Health-home    Review of Systems     Cardiac Risk Factors include: advanced age (>2mn, >>58women);family history of premature cardiovascular disease;hypertension;male gender;diabetes mellitus;sedentary lifestyle     Objective:    Today's Vitals   There is no height or weight on file to calculate BMI.     02/27/2022   10:46 AM 04/26/2017    8:50 AM 04/26/2017    7:42 AM 04/19/2017    8:16 AM 12/27/2016    9:21 AM 10/09/2016    7:56 AM 06/20/2016   10:09 AM  Advanced Directives  Does Patient Have a Medical Advance Directive? Yes Yes Yes Yes No No Yes  Type of [place] will [place] will [place] will     Does patient want to make changes to medical advance directive? No - Patient declined  No - Patient declined No - Patient declined     Copy of [place] Chart? No - copy requested No - copy requested No - copy requested      Would patient like information on creating a medical advance directive?     No - Patient declined No - Patient declined     Current Medications (verified) Outpatient Encounter Medications as of 02/27/2022  Medication Sig   aspirin 81 MG tablet Take 81 mg by mouth daily.    carvedilol (COREG) 12.5 MG tablet Take 1 tablet (12.5 mg total) by mouth 2 (two) times daily with a meal.   donepezil (ARICEPT) 10 MG tablet TAKE 1 TABLET BY MOUTH EVERY DAY    lactulose (CHRONULAC) 10 GM/15ML solution Take 30 mLs (20 g total) by mouth 2 (two) times daily.   lisinopril (ZESTRIL) 10 MG tablet Take 1 tablet (10 mg total) by mouth daily.   Multiple Vitamins-Minerals (PRESERVISION AREDS 2) CAPS Take 1 capsule by mouth daily.   simvastatin (ZOCOR) 20 MG tablet Take 1 tablet (20 mg total) by mouth at bedtime.   vitamin B-12 (CYANOCOBALAMIN) 1000 MCG tablet Take 1 tablet (1,000 mcg total) by mouth daily.   Multiple Vitamins-Minerals (ONE-A-DAY MENS 50+ ADVANTAGE) TABS Take 1 tablet by mouth daily. (Patient not taking: Reported on 02/12/2022)   No facility-administered encounter medications on file as of 02/27/2022.    Allergies (verified) Iohexol   History: Past Medical History:  Diagnosis Date   Aneurysm, thoracic aortic ([place] 08/[place]   4.2cm   CAD (coronary artery disease)    Evidence of previous inferior infarct by Myoview 2018   Cirrhosis (HCC)    Hep A and Hep B labs are negative. 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 reversal of colostomy 2003.    Essential hypertension    Gout 06/04/2011   Gout 06/04/2011   Hyperlipidemia    Type 2 diabetes mellitus (HCC)    Ventral hernia  07/24/2011   Past Surgical History:  Procedure Laterality Date   CATARACT EXTRACTION W/PHACO Left 09/11/2016   Procedure: CATARACT EXTRACTION PHACO AND INTRAOCULAR LENS PLACEMENT (IOC);  Surgeon: Rutherford Guys, MD;  Location: [facility];  Service: Ophthalmology;  Laterality: Left;  CDE: 19.23   CATARACT EXTRACTION W/PHACO Right 10/09/2016   Procedure: CATARACT EXTRACTION PHACO AND INTRAOCULAR LENS PLACEMENT (IOC);  Surgeon: Rutherford Guys, MD;  Location: [facility];  Service: Ophthalmology;  Laterality: Right;  CDE: 8.08   COLONOSCOPY  11/2002   Dr. Alphonsa Overall: anastomosis at 45cm, otherwise unremarkable.   COLONOSCOPY  05/2006    Dr. Alphonsa Overall: 4 polyps removed.   COLONOSCOPY  07/2009   Dr. Alphonsa Overall: 2 tubular adenomas, next TCS five years.    COLONOSCOPY N/A 06/20/2016   Dr. Gala Romney multiple colonic polyps removed, several in piecemeal fashion, a ascending colon polyp tubular adenoma with focal high-grade dysplasia, cecal polyps tubulovillous adenoma. Next colonoscopy June 2018   COLONOSCOPY N/A 12/27/2016   Dr. Gala Romney: Internal hemorrhoids, grade 1.  3 semi-pedunculated polyps in the ascending colon, removed.  Polyp in the appendiceal orifice (tubulovillous adenoma), sessile, piecemeal removal, not complete.  8 mm polyp in the ascending colon   COLOSTOMY REVERSAL  01/2002   Dr. Alphonsa Overall   ESOPHAGOGASTRODUODENOSCOPY N/A 06/20/2016   Dr. Gala Romney: Large hiatal hernia, esophagus normal   LAPAROSCOPIC APPENDECTOMY N/A 04/26/2017   Procedure: APPENDECTOMY LAPAROSCOPIC;  Surgeon: Michael Boston, MD;  Location: [facility];  Service: General;  Laterality: N/A;   LYSIS OF ADHESION N/A 04/26/2017   Procedure: LYSIS OF ADHESION;  Surgeon: Michael Boston, MD;  Location: [facility];  Service: General;  Laterality: N/A;   PARTIAL COLECTOMY  04/2001   Dr. 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 Alphonsa Overall   Family History  Problem Relation Age of Onset   Heart disease Father    Colon cancer Neg Hx    Social History   Socioeconomic History   Marital status: Divorced    Spouse name: Not on file   Number of children: 2   Years of education: high school   Highest education level: Not on file  Occupational History   Not on file  Tobacco Use   Smoking status: Former    Packs/day: 1.50    Years: 15.00    Total pack years: 22.50    Types: Cigarettes    Quit date: 09/06/1980    Years since quitting: 41.5   Smokeless tobacco: Never  Vaping Use   Vaping Use: Never used  Substance and Sexual Activity   Alcohol use: No   Drug use: No    Sexual activity: Never    Birth control/protection: None  Other Topics Concern   Not on file  Social History Narrative   10/16/21   From: the area   Living: with sister since July 2022   Work: retired - [employer] and [place]: 2 children - Shanon Brow ([place]) and [place] ([place]) - has grandchildren - does not have a good relationship with children      Enjoys: watch TV, used to paint      Exercise: not currently   Diet: pretty healthy      Safety   Seat belts: Yes    Guns: No   Safe in relationships: Yes       Social Determinants of Health  Financial Resource Strain: Low Risk  (02/27/2022)   Overall Financial Resource Strain (CARDIA)    Difficulty of Paying Living Expenses: Not hard at all  Food Insecurity: No Food Insecurity (02/27/2022)   Hunger Vital Sign    Worried About Running Out of Food in the Last Year: Never true    Ran Out of Food in the Last Year: Never true  Transportation Needs: No Transportation Needs (02/27/2022)   PRAPARE - Hydrologist (Medical): No    Lack of Transportation (Non-Medical): No  Physical Activity: Unknown (02/27/2022)   Exercise Vital Sign    Days of Exercise per Week: 0 days    Minutes of Exercise per Session: Not on file  Stress: No Stress Concern Present (02/27/2022)   [place]    Feeling of Stress : Not at all  Social Connections: Socially Isolated (02/27/2022)   Social Connection and Isolation Panel [NHANES]    Frequency of Communication with Friends and Family: Never    Frequency of Social Gatherings with Friends and Family: Once a week    Attends Religious Services: Never    Marine scientist or Organizations: No    Attends Music therapist: Never    Marital Status: Divorced    Tobacco Counseling Counseling given: Not Answered   Clinical Intake:  Pre-visit preparation completed: Yes  Pain :  No/denies pain     Nutritional Risks: None Diabetes: No  How often do you need to have someone help you when you read instructions, pamphlets, or other written materials from your doctor or pharmacy?: 2 - Rarely  Diabetic?  no  Interpreter Needed?: No  Information entered by :: Leroy Kennedy LPN   Activities of Daily Living    02/27/2022   10:39 AM  In your present state of health, do you have any difficulty performing the following activities:  Hearing? 0  Vision? 0  Difficulty concentrating or making decisions? 0  Walking or climbing stairs? 0  Dressing or bathing? 0  Doing errands, shopping? 0  Preparing Food and eating ? N  Using the Toilet? N  In the past six months, have you accidently leaked urine? N  Do you have problems with loss of bowel control? N  Managing your Medications? N  Managing your Finances? N  Housekeeping or managing your Housekeeping? N    Patient Care Team: Lesleigh Noe, MD as PCP - General (Family Medicine) Satira Sark, MD as PCP - Cardiology (Cardiology) Gala Romney Cristopher Estimable, MD as Consulting Physician (Gastroenterology) Michael Boston, MD as Consulting Physician (General Surgery) Satira Sark, MD as Consulting Physician (Cardiology)  Indicate any recent Medical Services you may have received from other than [facility] providers in the past year (date may be approximate).     Assessment:   This is a routine wellness examination for [place].  Hearing/Vision screen Hearing Screening - Comments:: No trouble hearing Vision Screening - Comments:: Up to date [facility]  Dietary issues and exercise activities discussed: Current Exercise Habits: The patient does not participate in regular exercise at present   Goals Addressed             This Visit's Progress    Patient Stated       No goals       Depression Screen    02/27/2022   10:38 AM  PHQ 2/9 Scores  PHQ - 2 Score 0    Fall Risk  No data to display           FALL RISK PREVENTION PERTAINING TO THE HOME:  Any stairs in or around the home? Yes  If so, are there any without handrails? No  Home free of loose throw rugs in walkways, pet beds, electrical cords, etc? Yes  Adequate lighting in your home to reduce risk of falls? Yes   ASSISTIVE DEVICES UTILIZED TO PREVENT FALLS:  Life alert? No  Use of a cane, walker or w/c? No  Grab bars in the bathroom? Yes  Shower chair or bench in shower? No  Elevated toilet seat or a handicapped toilet? Yes   TIMED UP AND GO:  Was the test performed? No .    Cognitive Function:      11/27/2021   11:49 AM  Montreal Cognitive Assessment   Visuospatial/ Executive (0/5) 3  Naming (0/3) 2  Attention: Read list of digits (0/2) 2  Attention: Read list of letters (0/1) 1  Attention: Serial 7 subtraction starting at 100 (0/3) 1  Language: Repeat phrase (0/2) 2  Language : Fluency (0/1) 1  Abstraction (0/2) 1  Delayed Recall (0/5) 1  Orientation (0/6) 3  Total 17      02/27/2022   10:33 AM  6CIT Screen  What Year? 0 points  What month? 0 points  What time? 0 points  Count back from 20 0 points  Months in reverse 4 points  Repeat phrase 0 points  Total Score 4 points    Immunizations Immunization History  Administered Date(s) Administered   Hep A / Hep B 01/30/2018, 03/02/2018, 08/02/2018   Influenza Whole 05/26/2007, 04/05/2008   Influenza, High Dose Seasonal PF 03/12/2017, 04/03/2018, 03/13/2019   PFIZER(Purple Top)SARS-COV-2 Vaccination 09/21/2019, 10/13/2019, 05/04/2020   Pfizer Covid-19 Vaccine Bivalent Booster 64yr & up 07/18/2021   Pneumococcal Polysaccharide-23 05/16/2002   Td 09/14/2003    TDAP status: Due, Education has been provided regarding the importance of this vaccine. Advised may receive this vaccine at local pharmacy or Health Dept. Aware to provide a copy of the vaccination record if obtained from local pharmacy or Health Dept. Verbalized acceptance and  understanding.  Flu Vaccine status: Due, Education has been provided regarding the importance of this vaccine. Advised may receive this vaccine at local pharmacy or Health Dept. Aware to provide a copy of the vaccination record if obtained from local pharmacy or Health Dept. Verbalized acceptance and understanding.  Pneumococcal vaccine status: Due, Education has been provided regarding the importance of this vaccine. Advised may receive this vaccine at local pharmacy or Health Dept. Aware to provide a copy of the vaccination record if obtained from local pharmacy or Health Dept. Verbalized acceptance and understanding.  Covid-19 vaccine status: Information provided on how to obtain vaccines.   Qualifies for Shingles Vaccine? Yes   Zostavax completed Yes   Shingrix Completed?: No.    Education has been provided regarding the importance of this vaccine. Patient has been advised to call insurance company to determine out of pocket expense if they have not yet received this vaccine. Advised may also receive vaccine at local pharmacy or Health Dept. Verbalized acceptance and understanding.  Screening Tests Health Maintenance  Topic Date Due   FOOT EXAM  Never done   Pneumonia Vaccine 85 Years old (2 - PCV) 05/17/2003   INFLUENZA VACCINE  02/13/2022   COVID-19 Vaccine (5 - Pfizer series) 03/15/2022 (Originally 11/15/2021)   Zoster Vaccines- Shingrix (1 of 2) 05/30/2022 (Originally 11/02/1986)  TETANUS/TDAP  03/13/2023 (Originally 09/13/2013)   HEMOGLOBIN A1C  04/17/2022   OPHTHALMOLOGY EXAM  08/04/2022   HPV VACCINES  Aged Out    Health Maintenance  Health Maintenance Due  Topic Date Due   FOOT EXAM  Never done   Pneumonia Vaccine 54+ Years old (2 - PCV) 05/17/2003   INFLUENZA VACCINE  02/13/2022    Colorectal cancer screening: No longer required.   Lung Cancer Screening: (Low Dose CT Chest recommended if Age 10-80 years, 30 pack-year currently smoking OR have quit w/in 15years.) does  not qualify.   Lung Cancer Screening Referral:   Additional Screening:  Hepatitis C Screening: does not qualify; Completed 2007  Vision Screening: Recommended annual ophthalmology exams for early detection of glaucoma and other disorders of the eye. Is the patient up to date with their annual eye exam?  Yes  Who is the provider or what is the name of the office in which the patient attends annual eye exams? [facility] If pt is not established with a provider, would they like to be referred to a provider to establish care? No .   Dental Screening: Recommended annual dental exams for proper oral hygiene  Community Resource Referral / Chronic Care Management: CRR required this visit?  No   CCM required this visit?  No      Plan:     I have personally reviewed and noted the following in the patient's chart:   Medical and social history Use of alcohol, tobacco or illicit drugs  Current medications and supplements including opioid prescriptions. Patient is not currently taking opioid prescriptions. Functional ability and status Nutritional status Physical activity Advanced directives List of other physicians Hospitalizations, surgeries, and ER visits in previous 12 months Vitals Screenings to include cognitive, depression, and falls Referrals and appointments  In addition, I have reviewed and discussed with patient certain preventive protocols, quality metrics, and best practice recommendations. A written personalized care plan for preventive services as well as general preventive health recommendations were provided to patient.     Leroy Kennedy, LPN   [id number]   Nurse Notes:

## 2022-02-27 NOTE — Patient Instructions (Signed)
Mark Le , Thank you for taking time to come for your Medicare Wellness Visit. I appreciate your ongoing commitment to your health goals. Please review the following plan we discussed and let me know if I can assist you in the future.   Screening recommendations/referrals: Colonoscopy: no longer required Recommended yearly ophthalmology/optometry visit for glaucoma screening and checkup Recommended yearly dental visit for hygiene and checkup  Vaccinations: Influenza vaccine: Education provided Pneumococcal vaccine: Education provided Tdap vaccine: Education provided Shingles vaccine: Education provided    Advanced directives: not on file  Conditions/risks identified:   Next appointment: 02-28-2022 @ 12:00  Mark Le 65 Years and Older, Male Preventive care refers to lifestyle choices and visits with your health care provider that can promote health and wellness. What does preventive care include? A yearly physical exam. This is also called an annual well check. Dental exams once or twice a year. Routine eye exams. Ask your health care provider how often you should have your eyes checked. Personal lifestyle choices, including: Daily care of your teeth and gums. Regular physical activity. Eating a healthy diet. Avoiding tobacco and drug use. Limiting alcohol use. Practicing safe sex. Taking low doses of aspirin every day. Taking vitamin and mineral supplements as recommended by your health care provider. What happens during an annual well check? The services and screenings done by your health care provider during your annual well check will depend on your age, overall health, lifestyle risk factors, and family history of disease. Counseling  Your health care provider may ask you questions about your: Alcohol use. Tobacco use. Drug use. Emotional well-being. Home and relationship well-being. Sexual activity. Eating habits. History of falls. Memory and ability  to understand (cognition). Work and work Statistician. Screening  You may have the following tests or measurements: Height, weight, and BMI. Blood pressure. Lipid and cholesterol levels. These may be checked every 5 years, or more frequently if you are over 21 years old. Skin check. Lung cancer screening. You may have this screening every year starting at age 51 if you have a 30-pack-year history of smoking and currently smoke or have quit within the past 15 years. Fecal occult blood test (FOBT) of the stool. You may have this test every year starting at age 26. Flexible sigmoidoscopy or colonoscopy. You may have a sigmoidoscopy every 5 years or a colonoscopy every 10 years starting at age 47. Prostate cancer screening. Recommendations will vary depending on your family history and other risks. Hepatitis C blood test. Hepatitis B blood test. Sexually transmitted disease (STD) testing. Diabetes screening. This is done by checking your blood sugar (glucose) after you have not eaten for a while (fasting). You may have this done every 1-3 years. Abdominal aortic aneurysm (AAA) screening. You may need this if you are a current or former smoker. Osteoporosis. You may be screened starting at age 12 if you are at high risk. Talk with your health care provider about your test results, treatment options, and if necessary, the need for more tests. Vaccines  Your health care provider may recommend certain vaccines, such as: Influenza vaccine. This is recommended every year. Tetanus, diphtheria, and acellular pertussis (Tdap, Td) vaccine. You may need a Td booster every 10 years. Zoster vaccine. You may need this after age 50. Pneumococcal 13-valent conjugate (PCV13) vaccine. One dose is recommended after age 56. Pneumococcal polysaccharide (PPSV23) vaccine. One dose is recommended after age 76. Talk to your health care provider about which screenings and vaccines you  need and how often you need  them. This information is not intended to replace advice given to you by your health care provider. Make sure you discuss any questions you have with your health care provider. Document Released: 07/29/2015 Document Revised: 03/21/2016 Document Reviewed: 05/03/2015 Elsevier Interactive Patient Education  2017 [place] Prevention in the Home Falls can cause injuries. They can happen to people of all ages. There are many things you can do to make your home safe and to help prevent falls. What can I do on the outside of my home? Regularly fix the edges of walkways and driveways and fix any cracks. Remove anything that might make you trip as you walk through a door, such as a raised step or threshold. Trim any bushes or trees on the path to your home. Use bright outdoor lighting. Clear any walking paths of anything that might make someone trip, such as rocks or tools. Regularly check to see if handrails are loose or broken. Make sure that both sides of any steps have handrails. Any raised decks and porches should have guardrails on the edges. Have any leaves, snow, or ice cleared regularly. Use sand or salt on walking paths during winter. Clean up any spills in your garage right away. This includes oil or grease spills. What can I do in the bathroom? Use night lights. Install grab bars by the toilet and in the tub and shower. Do not use towel bars as grab bars. Use non-skid mats or decals in the tub or shower. If you need to sit down in the shower, use a plastic, non-slip stool. Keep the floor dry. Clean up any water that spills on the floor as soon as it happens. Remove soap buildup in the tub or shower regularly. Attach bath mats securely with double-sided non-slip rug tape. Do not have throw rugs and other things on the floor that can make you trip. What can I do in the bedroom? Use night lights. Make sure that you have a light by your bed that is easy to reach. Do not use  any sheets or blankets that are too big for your bed. They should not hang down onto the floor. Have a firm chair that has side arms. You can use this for support while you get dressed. Do not have throw rugs and other things on the floor that can make you trip. What can I do in the kitchen? Clean up any spills right away. Avoid walking on wet floors. Keep items that you use a lot in easy-to-reach places. If you need to reach something above you, use a strong step stool that has a grab bar. Keep electrical cords out of the way. Do not use floor polish or wax that makes floors slippery. If you must use wax, use non-skid floor wax. Do not have throw rugs and other things on the floor that can make you trip. What can I do with my stairs? Do not leave any items on the stairs. Make sure that there are handrails on both sides of the stairs and use them. Fix handrails that are broken or loose. Make sure that handrails are as long as the stairways. Check any carpeting to make sure that it is firmly attached to the stairs. Fix any carpet that is loose or worn. Avoid having throw rugs at the top or bottom of the stairs. If you do have throw rugs, attach them to the floor with carpet tape. Make sure that  you have a light switch at the top of the stairs and the bottom of the stairs. If you do not have them, ask someone to add them for you. What else can I do to help prevent falls? Wear shoes that: Do not have high heels. Have rubber bottoms. Are comfortable and fit you well. Are closed at the toe. Do not wear sandals. If you use a stepladder: Make sure that it is fully opened. Do not climb a closed stepladder. Make sure that both sides of the stepladder are locked into place. Ask someone to hold it for you, if possible. Clearly mark and make sure that you can see: Any grab bars or handrails. First and last steps. Where the edge of each step is. Use tools that help you move around (mobility aids)  if they are needed. These include: Canes. Walkers. Scooters. Crutches. Turn on the lights when you go into a dark area. Replace any light bulbs as soon as they burn out. Set up your furniture so you have a clear path. Avoid moving your furniture around. If any of your floors are uneven, fix them. If there are any pets around you, be aware of where they are. Review your medicines with your doctor. Some medicines can make you feel dizzy. This can increase your chance of falling. Ask your doctor what other things that you can do to help prevent falls. This information is not intended to replace advice given to you by your health care provider. Make sure you discuss any questions you have with your health care provider. Document Released: 04/28/2009 Document Revised: 12/08/2015 Document Reviewed: 08/06/2014 Elsevier Interactive Patient Education  2017 [employer].

## 2022-02-28 ENCOUNTER — Encounter: Payer: Self-pay | Admitting: Family Medicine

## 2022-02-28 ENCOUNTER — Ambulatory Visit (INDEPENDENT_AMBULATORY_CARE_PROVIDER_SITE_OTHER): Payer: Medicare Other | Admitting: Family Medicine

## 2022-02-28 VITALS — BP 100/50 | HR 49 | Temp 97.3°F | Ht 72.2 in | Wt 173.2 lb

## 2022-02-28 DIAGNOSIS — I5032 Chronic diastolic (congestive) heart failure: Secondary | ICD-10-CM | POA: Diagnosis not present

## 2022-02-28 DIAGNOSIS — Z0000 Encounter for general adult medical examination without abnormal findings: Secondary | ICD-10-CM

## 2022-02-28 DIAGNOSIS — F03B0 Unspecified dementia, moderate, without behavioral disturbance, psychotic disturbance, mood disturbance, and anxiety: Secondary | ICD-10-CM

## 2022-02-28 DIAGNOSIS — E1169 Type 2 diabetes mellitus with other specified complication: Secondary | ICD-10-CM | POA: Diagnosis not present

## 2022-02-28 NOTE — Progress Notes (Signed)
Annual Exam   Chief Complaint:  Chief Complaint  Patient presents with   Medicare Wellness    Part 2     History of Present Illness:  Mark Le is a 87 y.o. presents today for annual examination.     Nutrition/Lifestyle Diet: good, leftovers Exercise: not currently He is not sexually active.    Social History   Tobacco Use  Smoking Status Former   Packs/day: 1.50   Years: 15.00   Total pack years: 22.50   Types: Cigarettes   Quit date: 09/06/1980   Years since quitting: 41.5  Smokeless Tobacco Never   Social History   Substance and Sexual Activity  Alcohol Use No   Social History   Substance and Sexual Activity  Drug Use No     Safety The patient wears seatbelts: yes.     The patient feels safe at home and in their relationships: yes.  General Health Dentist in the last year: No - dentures Eye doctor: yes  Weight Wt Readings from Last 3 Encounters:  02/28/22 173 lb 4 oz (78.6 kg)  02/12/22 173 lb 12.8 oz (78.8 kg)  11/27/21 175 lb 8 oz (79.6 kg)   Patient has normal BMI  BMI Readings from Last 1 Encounters:  02/28/22 23.37 kg/m     Chronic disease screening Blood pressure monitoring:  BP Readings from Last 3 Encounters:  02/28/22 (!) 100/50  02/12/22 110/64  11/27/21 110/60    Lipid Monitoring: Indication for screening: age >35, obesity, diabetes, family hx, CV risk factors.  Lipid screening: Yes  Lab Results  Component Value Date   CHOL 129 10/16/2021   HDL 33.20 (L) 10/16/2021   LDLCALC 65 10/16/2021   LDLDIRECT 81 01/21/2008   TRIG 156.0 (H) 10/16/2021   CHOLHDL 4 10/16/2021     Diabetes Screening: age >57, overweight, family hx, PCOS, hx of gestational diabetes, at risk ethnicity, elevated blood pressure >135/80.  Diabetes Screening screening: Yes  Lab Results  Component Value Date   HGBA1C 5.2 10/16/2021        Colon Cancer Screening:  Age 49-75 yo - benefits outweigh the risk. Adults 31-85 yo who have never  been screened benefit.  Benefits: 134000 people in 2016 will be diagnosed and 49,000 will die - early detection helps [place]: Complications 2/2 to colonoscopy High Risk (Colonoscopy): genetic disorder (Lynch syndrome or familial adenomatous polyposis), personal hx of IBD, previous adenomatous polyp, or previous colorectal cancer, FamHx start 10 years before the age at diagnosis, increased in males and black race  Options:  FIT - looks for hemoglobin (blood in the stool) - specific and fairly sensitive - must be done annually Cologuard - looks for DNA and blood - more sensitive - therefore can have more false positives, every 3 years Colonoscopy - every 10 years if normal - sedation, bowl prep, must have someone drive you  Shared decision making and the patient had decided to do aged out.   Social History   Tobacco Use  Smoking Status Former   Packs/day: 1.50   Years: 15.00   Total pack years: 22.50   Types: Cigarettes   Quit date: 09/06/1980   Years since quitting: 41.5  Smokeless Tobacco Never        Past Medical History:  Diagnosis Date   Aneurysm, thoracic aortic ([place]) 08/2016   4.2cm   CAD (coronary artery disease)    Evidence of previous inferior infarct by Myoview 2018   Cirrhosis ([place])    Hep  A and Hep B labs are negative. pt recieved Hep A and Hep B vaccines at [facility] in [place].    Colon cancer ([place]) 2002   T3 N2 tumor with 2 of 4 positive lymph nodes, s/p resection and colostomy and post-op chemotherapy. reversal of colostomy 2003.    Essential hypertension    Gout 06/04/2011   Gout 06/04/2011   Hyperlipidemia    Type 2 diabetes mellitus ([place])    Ventral hernia 07/24/2011    Past Surgical History:  Procedure Laterality Date   CATARACT EXTRACTION W/PHACO Left 09/11/2016   Procedure: CATARACT EXTRACTION PHACO AND INTRAOCULAR LENS PLACEMENT ([place]);  Surgeon: Rutherford Guys, MD;  Location: [facility];  Service: Ophthalmology;  Laterality: Left;  CDE: 19.23    CATARACT EXTRACTION W/PHACO Right 10/09/2016   Procedure: CATARACT EXTRACTION PHACO AND INTRAOCULAR LENS PLACEMENT ([facility]);  Surgeon: Rutherford Guys, MD;  Location: [facility];  Service: Ophthalmology;  Laterality: Right;  CDE: 8.08   COLONOSCOPY  11/2002   Dr. Alphonsa Overall: anastomosis at 45cm, otherwise unremarkable.   COLONOSCOPY  05/2006   Dr. Alphonsa Overall: 4 polyps removed.   COLONOSCOPY  07/2009   Dr. Alphonsa Overall: 2 tubular adenomas, next TCS five years.    COLONOSCOPY N/A 06/20/2016   Dr. Gala Romney multiple colonic polyps removed, several in piecemeal fashion, a ascending colon polyp tubular adenoma with focal high-grade dysplasia, cecal polyps tubulovillous adenoma. Next colonoscopy June 2018   COLONOSCOPY N/A 12/27/2016   Dr. Gala Romney: Internal hemorrhoids, grade 1.  3 semi-pedunculated polyps in the ascending colon, removed.  Polyp in the appendiceal orifice (tubulovillous adenoma), sessile, piecemeal removal, not complete.  8 mm polyp in the ascending colon   COLOSTOMY REVERSAL  01/2002   Dr. Alphonsa Overall   ESOPHAGOGASTRODUODENOSCOPY N/A 06/20/2016   Dr. Gala Romney: Large hiatal hernia, esophagus normal   LAPAROSCOPIC APPENDECTOMY N/A 04/26/2017   Procedure: APPENDECTOMY LAPAROSCOPIC;  Surgeon: Michael Boston, MD;  Location: [facility];  Service: General;  Laterality: N/A;   LYSIS OF ADHESION N/A 04/26/2017   Procedure: LYSIS OF ADHESION;  Surgeon: Michael Boston, MD;  Location: [facility];  Service: General;  Laterality: N/A;   PARTIAL COLECTOMY  04/2001   Dr. 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 Alphonsa Overall    Prior to Admission medications   Medication Sig Start Date End Date Taking? Authorizing Provider  aspirin 81 MG tablet Take 81 mg by mouth daily.    Yes [provider]  carvedilol (COREG) 12.5 MG tablet Take 1 tablet (12.5 mg total) by mouth 2 (two) times daily with a meal. 10/16/21  Yes Lesleigh Noe, MD  donepezil (ARICEPT) 10 MG tablet TAKE 1 TABLET BY MOUTH EVERY DAY 01/12/22  Yes Lesleigh Noe, MD  lactulose (CHRONULAC) 10 GM/15ML solution Take 30 mLs (20 g total) by mouth 2 (two) times daily. 02/13/22  Yes [name], MD  lisinopril (ZESTRIL) 10 MG tablet Take 1 tablet (10 mg total) by mouth daily. 10/16/21  Yes Lesleigh Noe, MD  Multiple Vitamins-Minerals (ONE-A-DAY MENS 50+ ADVANTAGE) TABS Take 1 tablet by mouth daily.   Yes [provider]  Multiple Vitamins-Minerals (PRESERVISION AREDS 2) CAPS Take 1 capsule by mouth 2 (two) times daily.   Yes [provider]  simvastatin (ZOCOR) 20 MG tablet Take 1 tablet (20 mg total) by mouth at bedtime. 10/16/21  Yes Lesleigh Noe, MD  vitamin B-12 (CYANOCOBALAMIN)  1000 MCG tablet Take 1 tablet (1,000 mcg total) by mouth daily. 11/27/21  Yes Lesleigh Noe, MD    Allergies  Allergen Reactions   Iohexol Hives     Code: HIVES, Desc: pt developed hives on arms, neck and chest after iv injection for ct scan, Onset Date: [id number]      Social History   Socioeconomic History   Marital status: Divorced    Spouse name: Not on file   Number of children: 2   Years of education: high school   Highest education level: Not on file  Occupational History   Not on file  Tobacco Use   Smoking status: Former    Packs/day: 1.50    Years: 15.00    Total pack years: 22.50    Types: Cigarettes    Quit date: 09/06/1980    Years since quitting: 41.5   Smokeless tobacco: Never  Vaping Use   Vaping Use: Never used  Substance and Sexual Activity   Alcohol use: No   Drug use: No   Sexual activity: Never    Birth control/protection: None  Other Topics Concern   Not on file  Social History Narrative   10/16/21   From: the area   Living: with sister since July 2022   Work: retired - [employer] and [place]: 2 children - Shanon Brow ([place]) and [place] ([place]) - has grandchildren - does not have a good relationship  with children      Enjoys: watch TV, used to paint      Exercise: not currently   Diet: pretty healthy      Safety   Seat belts: Yes    Guns: No   Safe in relationships: Yes       Social Determinants of Health   Financial Resource Strain: Low Risk  (02/27/2022)   Overall Financial Resource Strain (CARDIA)    Difficulty of Paying Living Expenses: Not hard at all  Food Insecurity: No Food Insecurity (02/27/2022)   Hunger Vital Sign    Worried About Running Out of Food in the Last Year: Never true    [place] in the Last Year: Never true  Transportation Needs: No Transportation Needs (02/27/2022)   PRAPARE - Hydrologist (Medical): No    Lack of Transportation (Non-Medical): No  Physical Activity: Unknown (02/27/2022)   Exercise Vital Sign    Days of Exercise per Week: 0 days    Minutes of Exercise per Session: Not on file  Stress: No Stress Concern Present (02/27/2022)   [place]    Feeling of Stress : Not at all  Social Connections: Socially Isolated (02/27/2022)   Social Connection and Isolation Panel [NHANES]    Frequency of Communication with Friends and Family: Never    Frequency of Social Gatherings with Friends and Family: Once a week    Attends Religious Services: Never    Marine scientist or Organizations: No    Attends Archivist Meetings: Never    Marital Status: Divorced  Human resources officer Violence: Not At Risk (02/27/2022)   Humiliation, Afraid, Rape, and Kick questionnaire    Fear of Current or Ex-Partner: No    Emotionally Abused: No    Physically Abused: No    Sexually Abused: No    Family History  Problem Relation Age of Onset   Heart disease Father  Colon cancer Neg Hx     Review of Systems  Constitutional:  Negative for chills and fever.  HENT:  Negative for congestion and sore throat.   Eyes:  Negative for blurred vision and  double vision.  Respiratory:  Negative for shortness of breath.   Cardiovascular:  Negative for chest pain.  Gastrointestinal:  Negative for heartburn, nausea and vomiting.  Genitourinary: Negative.   Musculoskeletal: Negative.  Negative for myalgias.  Skin:  Negative for rash.  Neurological:  Negative for dizziness and headaches.  Endo/Heme/Allergies:  Does not bruise/bleed easily.  Psychiatric/Behavioral:  Negative for depression. The patient is not nervous/anxious.      Physical Exam BP (!) 100/50   Pulse (!) 49   Temp (!) 97.3 F (36.3 C) (Temporal)   Ht 6' 0.2" (1.834 m)   Wt 173 lb 4 oz (78.6 kg)   SpO2 96%   BMI 23.37 kg/m    BP Readings from Last 3 Encounters:  02/28/22 (!) 100/50  02/12/22 110/64  11/27/21 110/60      Physical Exam Constitutional:      General: He is not in acute distress.    Appearance: He is well-developed. He is not diaphoretic.  HENT:     Head: Normocephalic and atraumatic.     Right Ear: Tympanic membrane and ear canal normal.     Left Ear: Tympanic membrane and ear canal normal.     Nose: Nose normal.     Mouth/Throat:     Pharynx: Uvula midline.  Eyes:     General: No scleral icterus.    Conjunctiva/sclera: Conjunctivae normal.     Pupils: Pupils are equal, round, and reactive to light.  Cardiovascular:     Rate and Rhythm: Normal rate and regular rhythm.     Heart sounds: Murmur heard.  Pulmonary:     Effort: Pulmonary effort is normal. No respiratory distress.     Breath sounds: Normal breath sounds. No wheezing.  Abdominal:     General: Bowel sounds are normal. There is no distension.     Palpations: Abdomen is soft. There is no mass.     Tenderness: There is no abdominal tenderness. There is no guarding.  Musculoskeletal:        General: Normal range of motion.     Cervical back: Normal range of motion and neck supple.     Right lower leg: Edema (2+ to mid calf b/l) present.     Left lower leg: Edema present.   Lymphadenopathy:     Cervical: No cervical adenopathy.  Skin:    General: Skin is warm and dry.     Capillary Refill: Capillary refill takes less than 2 seconds.  Neurological:     Mental Status: He is alert and oriented to person, place, and time.        Results:  PHQ-9:     02/27/2022   10:38 AM  Depression screen PHQ 2/9  Decreased Interest 0  Down, Depressed, Hopeless 0  PHQ - 2 Score 0       Assessment: 87 y.o. here for routine annual physical examination.  Plan: Problem List Items Addressed This Visit       Cardiovascular and Mediastinum   Chronic diastolic heart failure (HCC)    Bilateral pedal edema.  Reviewed echo from 2018 with mild diastolic heart failure.  He denies any breathing symptoms.  Discussed warning signs of worsening HF and to consider work-up and treatment. Intolerant of compression socks. Not bothered by  edema so will avoid lasix unless worsening        Endocrine   DM (diabetes mellitus) - diet controlled    Lab Results  Component Value Date   HGBA1C 5.2 10/16/2021  Continue diet. Normal foot exam. Advised getting eye exam        Nervous and Auditory   Dementia ([place])    Stable. Reviewed MRI Brain. Cont simvastatin and asa. Cont neuro support and donepezil.       Other Visit Diagnoses     Annual physical exam    -  Primary       Screening: -- Blood pressure screen  controlled -- cholesterol screening: not due for screening -- Weight screening: overweight: continue to monitor -- Diabetes Screening: not due for screening -- Nutrition: normal - Encouraged healthy diet  The ASCVD Risk score (Arnett DK, et al., 2019) failed to calculate for the following reasons:   The 2019 ASCVD risk score is only valid for ages 75 to 2  -- ASA 81 mg discussed if CVD risk >10% age 67-59 and willing to take for 10 years -- Statin therapy for Age 17-75 with CVD risk >7.5%  Psych -- Depression screening (PHQ-9): negative  Safety --  tobacco screening: not using -- alcohol screening:  low-risk usage. -- no evidence of domestic violence or intimate partner violence.  Cancer Screening - aged out   Immunizations Immunization History  Administered Date(s) Administered   Hep A / Hep B 01/30/2018, 03/02/2018, 08/02/2018   Influenza Whole 05/26/2007, 04/05/2008   Influenza, High Dose Seasonal PF 03/12/2017, 04/03/2018, 03/13/2019   PFIZER(Purple Top)SARS-COV-2 Vaccination 09/21/2019, 10/13/2019, 05/04/2020   Pfizer Covid-19 Vaccine Bivalent Booster 24yr & up 07/18/2021   Pneumococcal Polysaccharide-23 05/16/2002   Td 09/14/2003    -- flu vaccine get in season -- TDAP q10 years up to date -- Shingles (age >>30 not up to date - get at pharmacy -- PPSV-23 (19-64 with chronic disease or smoking) up to date -- PCV-13 (age >>82 - one dose followed by PPSV-23 1 year later not up to date - checking records -- Covid-19 Vaccine up to date  Encouraged regular vision and dental screening. Encouraged healthy exercise and diet.   JLesleigh Noe

## 2022-02-28 NOTE — Assessment & Plan Note (Signed)
Stable. Reviewed MRI Brain. Cont simvastatin and asa. Cont neuro support and donepezil.

## 2022-02-28 NOTE — Assessment & Plan Note (Signed)
Bilateral pedal edema.  Reviewed echo from 2018 with mild diastolic heart failure.  He denies any breathing symptoms.  Discussed warning signs of worsening HF and to consider work-up and treatment. Intolerant of compression socks. Not bothered by edema so will avoid lasix unless worsening

## 2022-02-28 NOTE — Patient Instructions (Addendum)
Check with pharmacy about prevnar    Can check back in 1 month for a physician update  Or Tabitha or Matt in 6 months

## 2022-02-28 NOTE — Assessment & Plan Note (Signed)
Lab Results  Component Value Date   HGBA1C 5.2 10/16/2021   Continue diet. Normal foot exam. Advised getting eye exam

## 2022-03-06 ENCOUNTER — Encounter: Payer: Self-pay | Admitting: Gastroenterology

## 2022-03-07 ENCOUNTER — Ambulatory Visit: Payer: Medicare Other | Admitting: Anesthesiology

## 2022-03-07 ENCOUNTER — Encounter: Payer: Self-pay | Admitting: Gastroenterology

## 2022-03-07 ENCOUNTER — Encounter
Admission: RE | Disposition: A | Discharge status: Home / Self Care | Payer: Self-pay | Source: Home / Self Care | Attending: Gastroenterology

## 2022-03-07 ENCOUNTER — Ambulatory Visit
Admission: RE | Admit: 2022-03-07 | Discharge: 2022-03-07 | Disposition: A | Discharge status: Home / Self Care | Payer: Medicare Other | Attending: Gastroenterology | Admitting: Gastroenterology

## 2022-03-07 DIAGNOSIS — Z79899 Other long term (current) drug therapy: Secondary | ICD-10-CM | POA: Insufficient documentation

## 2022-03-07 DIAGNOSIS — I2510 Atherosclerotic heart disease of native coronary artery without angina pectoris: Secondary | ICD-10-CM | POA: Insufficient documentation

## 2022-03-07 DIAGNOSIS — E1151 Type 2 diabetes mellitus with diabetic peripheral angiopathy without gangrene: Secondary | ICD-10-CM | POA: Diagnosis not present

## 2022-03-07 DIAGNOSIS — I129 Hypertensive chronic kidney disease with stage 1 through stage 4 chronic kidney disease, or unspecified chronic kidney disease: Secondary | ICD-10-CM | POA: Diagnosis not present

## 2022-03-07 DIAGNOSIS — K7030 Alcoholic cirrhosis of liver without ascites: Secondary | ICD-10-CM

## 2022-03-07 DIAGNOSIS — I110 Hypertensive heart disease with heart failure: Secondary | ICD-10-CM | POA: Diagnosis not present

## 2022-03-07 DIAGNOSIS — F0390 Unspecified dementia without behavioral disturbance: Secondary | ICD-10-CM | POA: Insufficient documentation

## 2022-03-07 DIAGNOSIS — I8510 Secondary esophageal varices without bleeding: Secondary | ICD-10-CM

## 2022-03-07 DIAGNOSIS — K7460 Unspecified cirrhosis of liver: Secondary | ICD-10-CM | POA: Insufficient documentation

## 2022-03-07 DIAGNOSIS — Z87891 Personal history of nicotine dependence: Secondary | ICD-10-CM | POA: Insufficient documentation

## 2022-03-07 DIAGNOSIS — I5032 Chronic diastolic (congestive) heart failure: Secondary | ICD-10-CM | POA: Diagnosis not present

## 2022-03-07 DIAGNOSIS — E1122 Type 2 diabetes mellitus with diabetic chronic kidney disease: Secondary | ICD-10-CM | POA: Insufficient documentation

## 2022-03-07 HISTORY — DX: Secondary esophageal varices without bleeding: K74.60

## 2022-03-07 HISTORY — DX: Chronic kidney disease, unspecified: N18.9

## 2022-03-07 HISTORY — DX: Unspecified cirrhosis of liver: I85.10

## 2022-03-07 HISTORY — DX: Deficiency of other specified B group vitamins: E53.8

## 2022-03-07 HISTORY — PX: ESOPHAGOGASTRODUODENOSCOPY (EGD) WITH PROPOFOL: SHX5813

## 2022-03-07 SURGERY — ESOPHAGOGASTRODUODENOSCOPY (EGD) WITH PROPOFOL
Anesthesia: General

## 2022-03-07 MED ORDER — SODIUM CHLORIDE 0.9 % IV SOLN: INTRAVENOUS | Status: DC

## 2022-03-07 MED ORDER — PROPOFOL 500 MG/50ML IV EMUL
INTRAVENOUS | Status: DC | PRN
  Administered 2022-03-07: 125 ug/kg/min via INTRAVENOUS

## 2022-03-07 MED ORDER — LIDOCAINE HCL (CARDIAC) PF 100 MG/5ML IV SOSY
PREFILLED_SYRINGE | INTRAVENOUS | Status: DC | PRN
  Administered 2022-03-07: 100 mg via INTRAVENOUS

## 2022-03-07 MED ORDER — GLYCOPYRROLATE 0.2 MG/ML IJ SOLN
INTRAMUSCULAR | Status: DC | PRN
  Administered 2022-03-07: .2 mg via INTRAVENOUS

## 2022-03-07 MED ORDER — PROPOFOL 10 MG/ML IV BOLUS
INTRAVENOUS | Status: DC | PRN
  Administered 2022-03-07: 10 mg via INTRAVENOUS
  Administered 2022-03-07: 60 mg via INTRAVENOUS

## 2022-03-07 NOTE — Op Note (Signed)
[facility] Gastroenterology Patient Name: Dalen Henn[place] Procedure Date: 03/07/2022 8:09 AM MRN: [id number] Account #: [id number] Date of Birth: 1934/12/17 Admit Type: Outpatient Age: 87 Room: [facility] ENDO ROOM 3 Gender: Male Note Status: Finalized Instrument Name: Upper Endoscope [id number] Procedure:             Upper GI endoscopy Indications:           Cirrhosis rule out esophageal varices Providers:             [name] MD, MD Referring MD:          [name] MD, MD (Referring MD), Jobe Marker. Einar Pheasant                         (Referring MD) Medicines:             Monitored Anesthesia Care Complications:         No immediate complications. Procedure:             Pre-Anesthesia Assessment:                        - Prior to the procedure, a History and Physical was                         performed, and patient medications, allergies and                         sensitivities were reviewed. The patient's tolerance                         of previous anesthesia was reviewed.                        - The risks and benefits of the procedure and the                         sedation options and risks were discussed with the                         patient. All questions were answered and informed                         consent was obtained.                        - ASA Grade Assessment: II - A patient with mild                         systemic disease.                        After obtaining informed consent, the endoscope was                         passed under direct vision. Throughout the procedure,                         the patient's blood pressure, pulse, and oxygen  saturations were monitored continuously. The                         Endosonoscope was introduced through the mouth, and                         advanced to the third part of duodenum. The patient                         tolerated the procedure well. Findings:      The esophagus was  normal.      The stomach was normal.      The examined duodenum was normal. Impression:            - Normal esophagus.                        - Normal stomach.                        - Normal examined duodenum.                        - No specimens collected. Recommendation:        - Discharge patient to home (with escort).                        - Resume previous diet.                        - Continue present medications.                        - Discharge patient to home (ambulatory).                        - Resume previous diet.                        - Continue present medications.                        - Repeat upper endoscopy in 2 years for surveillance. Procedure Code(s):     --- Professional ---                        [phone], Esophagogastroduodenoscopy, flexible,                         transoral; diagnostic, including collection of                         specimen(s) by brushing or washing, when performed                         (separate procedure) Diagnosis Code(s):     --- Professional ---                        K74.60, Unspecified cirrhosis of liver CPT copyright 2019 American Medical Association. All rights reserved. The codes documented in this report are preliminary and upon coder review may  be revised to meet current compliance requirements. [name], MD [name] MD,  MD 03/07/2022 8:24:17 AM This report has been signed electronically. Number of Addenda: 0 Note Initiated On: 03/07/2022 8:09 AM Estimated Blood Loss:  Estimated blood loss: none.      [facility]

## 2022-03-07 NOTE — Transfer of Care (Signed)
Immediate Anesthesia Transfer of Care Note  Patient: [place]  Procedure(s) Performed: ESOPHAGOGASTRODUODENOSCOPY (EGD) WITH PROPOFOL  Patient Location: Endoscopy Unit  Anesthesia Type:General  Level of Consciousness: awake, drowsy and patient cooperative  Airway & Oxygen Therapy: Patient Spontanous Breathing and Patient connected to face mask oxygen  Post-op Assessment: Report given to RN and Post -op Vital signs reviewed and stable  Post vital signs: Reviewed and stable  Last Vitals:  Vitals Value Taken Time  BP 125/68 03/07/22 0824  Temp    Pulse 83 03/07/22 0824  Resp 20 03/07/22 0824  SpO2 100 % 03/07/22 0824  Vitals shown include unvalidated device data.  Last Pain:  Vitals:   03/07/22 0715  TempSrc: Temporal  PainSc: 0-No pain         Complications: No notable events documented.

## 2022-03-07 NOTE — Anesthesia Preprocedure Evaluation (Signed)
Anesthesia Evaluation  Patient identified by MRN, date of birth, ID band Patient awake    Reviewed: Allergy & Precautions, NPO status , Patient's Chart, lab work & pertinent test results, reviewed documented beta blocker date and time   History of Anesthesia Complications Negative for: history of anesthetic complications  Airway Mallampati: I  TM Distance: >3 FB Neck ROM: Full    Dental  (+) Edentulous Upper, Edentulous Lower   Pulmonary neg pulmonary ROS, neg sleep apnea, neg COPD, Patient abstained from smoking.Not current smoker, former smoker,    breath sounds clear to auscultation       Cardiovascular Exercise Tolerance: Poor METShypertension, Pt. on medications + Peripheral Vascular Disease (AAA)  (-) CAD and (-) Past MI (-) dysrhythmias  Rhythm:Regular Rate:Normal  EKG - 1st deg AVB  TTE 02/2017 - Mild LVH. Ejection fraction was in the range of 55% to 60%. Grade 1 diastolic dysfunction. AV sclerosis without stenosis, trivial AI. Nuc showed small inferior wall infarct from apex to base with no ischemia.   Neuro/Psych PSYCHIATRIC DISORDERS Dementia negative neurological ROS  negative psych ROS   GI/Hepatic negative GI ROS, neg GERD  ,(+) Cirrhosis   Esophageal Varices  (-) substance abuse  ,   Endo/Other  diabetes, Type 2  Renal/GU CRFRenal disease  negative genitourinary   Musculoskeletal Gout   Abdominal   Peds  Hematology negative hematology ROS (+)   Anesthesia Other Findings Past Medical History: 08/2016: Aneurysm, thoracic aortic (HCC)     Comment:  4.2cm No date: B12 deficiency No date: CAD (coronary artery disease)     Comment:  Evidence of previous inferior infarct by Myoview 2018 No date: Chronic kidney disease No date: Cirrhosis ([place])     Comment:  Hep A and Hep B labs are negative. pt recieved Hep A and              Hep B vaccines at [facility] in [place].  2002: Colon cancer ([place])      Comment:  T3 N2 tumor with 2 of 4 positive lymph nodes, s/p               resection and colostomy and post-op chemotherapy.               reversal of colostomy 2003.  No date: Esophageal varices in cirrhosis ([place]) No date: Essential hypertension 06/04/2011: Gout 06/04/2011: Gout No date: Hyperlipidemia No date: Type 2 diabetes mellitus ([place]) 07/24/2011: Ventral hernia   Reproductive/Obstetrics                             Anesthesia Physical  Anesthesia Plan  ASA: 3  Anesthesia Plan: General   Post-op Pain Management: Minimal or no pain anticipated   Induction: Intravenous  PONV Risk Score and Plan: 2 and Ondansetron, Propofol infusion and TIVA  Airway Management Planned: Natural Airway  Additional Equipment: None  Intra-op Plan:   Post-operative Plan:   Informed Consent: I have reviewed the patients History and Physical, chart, labs and discussed the procedure including the risks, benefits and alternatives for the proposed anesthesia with the patient or authorized representative who has indicated his/her understanding and acceptance.     Dental advisory given  Plan Discussed with: CRNA  Anesthesia Plan Comments: (Discussed risks of anesthesia with patient, including possibility of difficulty with spontaneous ventilation under anesthesia necessitating airway intervention, PONV, and rare risks such as cardiac or respiratory or neurological events, and allergic reactions. Discussed  the role of CRNA in patient's perioperative care. Patient understands.)        Anesthesia Quick Evaluation

## 2022-03-07 NOTE — Anesthesia Postprocedure Evaluation (Signed)
Anesthesia Post Note  Patient: Mark Le  Procedure(s) Performed: ESOPHAGOGASTRODUODENOSCOPY (EGD) WITH PROPOFOL  Patient location during evaluation: Endoscopy Anesthesia Type: General Level of consciousness: awake and alert Pain management: pain level controlled Vital Signs Assessment: post-procedure vital signs reviewed and stable Respiratory status: spontaneous breathing, nonlabored ventilation, respiratory function stable and patient connected to nasal cannula oxygen Cardiovascular status: blood pressure returned to baseline and stable Postop Assessment: no apparent nausea or vomiting Anesthetic complications: no   No notable events documented.   Last Vitals:  Vitals:   03/07/22 0833 03/07/22 0843  BP: (!) 145/73 (!) 144/74  Pulse: 72 67  Resp: 18 15  Temp:    SpO2: 98% 100%    Last Pain:  Vitals:   03/07/22 0843  TempSrc:   PainSc: 0-No pain                 Arita Miss

## 2022-03-07 NOTE — H&P (Signed)
Mark Bellows, MD [address], [place], [place], [place], [address] [address], [place], [place], [place], [address] Phone: [phone]  Fax: [phone]  Primary Care Physician:  Lesleigh Noe, MD   Pre-Procedure History & Physical: HPI:  Mark Le is a 87 y.o. male is here for an endoscopy    Past Medical History:  Diagnosis Date   Aneurysm, thoracic aortic ([place]) 08/2016   4.2cm   B12 deficiency    CAD (coronary artery disease)    Evidence of previous inferior infarct by Myoview 2018   Chronic kidney disease    Cirrhosis ([place])    Hep A and Hep B labs are negative. pt recieved Hep A and Hep B vaccines at [facility] in [place].    Colon cancer ([place]) 2002   T3 N2 tumor with 2 of 4 positive lymph nodes, s/p resection and colostomy and post-op chemotherapy. reversal of colostomy 2003.    Esophageal varices in cirrhosis [facility])    Essential hypertension    Gout 06/04/2011   Gout 06/04/2011   Hyperlipidemia    Type 2 diabetes mellitus ([place])    Ventral hernia 07/24/2011    Past Surgical History:  Procedure Laterality Date   CATARACT EXTRACTION W/PHACO Left 09/11/2016   Procedure: CATARACT EXTRACTION PHACO AND INTRAOCULAR LENS PLACEMENT ([place]);  Surgeon: Rutherford Guys, MD;  Location: [facility];  Service: Ophthalmology;  Laterality: Left;  CDE: 19.23   CATARACT EXTRACTION W/PHACO Right 10/09/2016   Procedure: CATARACT EXTRACTION PHACO AND INTRAOCULAR LENS PLACEMENT ([facility]);  Surgeon: Rutherford Guys, MD;  Location: [facility];  Service: Ophthalmology;  Laterality: Right;  CDE: 8.08   COLONOSCOPY  11/2002   Dr. Alphonsa Overall: anastomosis at 45cm, otherwise unremarkable.   COLONOSCOPY  05/2006   Dr. Alphonsa Overall: 4 polyps removed.   COLONOSCOPY  07/2009   Dr. Alphonsa Overall: 2 tubular adenomas, next TCS five years.    COLONOSCOPY N/A 06/20/2016   Dr. Gala Romney multiple colonic polyps removed, several in piecemeal fashion, a ascending colon polyp tubular adenoma with focal high-grade  dysplasia, cecal polyps tubulovillous adenoma. Next colonoscopy June 2018   COLONOSCOPY N/A 12/27/2016   Dr. Gala Romney: Internal hemorrhoids, grade 1.  3 semi-pedunculated polyps in the ascending colon, removed.  Polyp in the appendiceal orifice (tubulovillous adenoma), sessile, piecemeal removal, not complete.  8 mm polyp in the ascending colon   COLOSTOMY REVERSAL  01/2002   Dr. Alphonsa Overall   ESOPHAGOGASTRODUODENOSCOPY N/A 06/20/2016   Dr. Gala Romney: Large hiatal hernia, esophagus normal   LAPAROSCOPIC APPENDECTOMY N/A 04/26/2017   Procedure: APPENDECTOMY LAPAROSCOPIC;  Surgeon: Michael Boston, MD;  Location: [facility];  Service: General;  Laterality: N/A;   LYSIS OF ADHESION N/A 04/26/2017   Procedure: LYSIS OF ADHESION;  Surgeon: Michael Boston, MD;  Location: [facility];  Service: General;  Laterality: N/A;   PARTIAL COLECTOMY  04/2001   Dr. 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 Alphonsa Overall    Prior to Admission medications   Medication Sig Start Date End Date Taking? Authorizing Provider  aspirin 81 MG tablet Take 81 mg by mouth daily.    Yes [provider]  carvedilol (COREG) 12.5 MG tablet Take 1 tablet (12.5 mg total) by mouth 2 (two) times daily with a meal. 10/16/21  Yes Lesleigh Noe, MD  donepezil (ARICEPT) 10 MG tablet TAKE 1 TABLET BY MOUTH EVERY DAY 01/12/22  Yes Lesleigh Noe, MD  lactulose (CHRONULAC) 10 GM/15ML solution Take 30 mLs (20 g total) by mouth 2 (two) times daily. 02/13/22  Yes Mark Bellows, MD  lisinopril (ZESTRIL) 10 MG tablet Take 1 tablet (10 mg total) by mouth daily. 10/16/21  Yes Lesleigh Noe, MD  simvastatin (ZOCOR) 20 MG tablet Take 1 tablet (20 mg total) by mouth at bedtime. 10/16/21  Yes Lesleigh Noe, MD  Multiple Vitamins-Minerals (ONE-A-DAY MENS 50+ ADVANTAGE) TABS Take 1 tablet by mouth daily.    [provider]  Multiple Vitamins-Minerals (PRESERVISION AREDS  2) CAPS Take 1 capsule by mouth 2 (two) times daily.    [provider]  vitamin B-12 (CYANOCOBALAMIN) 1000 MCG tablet Take 1 tablet (1,000 mcg total) by mouth daily. 11/27/21   Lesleigh Noe, MD    Allergies as of 02/13/2022 - Review Complete 02/12/2022  Allergen Reaction Noted   Iohexol Hives 05/21/2007    Family History  Problem Relation Age of Onset   Heart disease Father    Colon cancer Neg Hx     Social History   Socioeconomic History   Marital status: Divorced    Spouse name: Not on file   Number of children: 2   Years of education: high school   Highest education level: Not on file  Occupational History   Not on file  Tobacco Use   Smoking status: Former    Packs/day: 1.50    Years: 15.00    Total pack years: 22.50    Types: Cigarettes    Quit date: 09/06/1980    Years since quitting: 41.5   Smokeless tobacco: Never  Vaping Use   Vaping Use: Never used  Substance and Sexual Activity   Alcohol use: No   Drug use: No   Sexual activity: Never    Birth control/protection: None  Other Topics Concern   Not on file  Social History Narrative   10/16/21   From: the area   Living: with sister since July 2022   Work: retired - [employer] and [employer]: 2 children - Shanon Brow ([place]) and Lisle ([place]) - has grandchildren - does not have a good relationship with children      Enjoys: watch TV, used to paint      Exercise: not currently   Diet: pretty healthy      Safety   Seat belts: Yes    Guns: No   Safe in relationships: Yes       Social Determinants of Health   Financial Resource Strain: Low Risk  (02/27/2022)   Overall Financial Resource Strain (CARDIA)    Difficulty of Paying Living Expenses: Not hard at all  Food Insecurity: No Food Insecurity (02/27/2022)   Hunger Vital Sign    Worried About Running Out of Food in the Last Year: Never true    [place] in the Last Year: Never true  Transportation Needs: No  Transportation Needs (02/27/2022)   PRAPARE - Hydrologist (Medical): No    Lack of Transportation (Non-Medical): No  Physical Activity: Unknown (02/27/2022)   Exercise Vital Sign    Days of Exercise per Week: 0 days    Minutes of Exercise per Session: Not on file  Stress: No Stress Concern Present (02/27/2022)   [place]    Feeling of Stress : Not at all  Social Connections: Socially Isolated (  02/27/2022)   Social Connection and Isolation Panel [NHANES]    Frequency of Communication with Friends and Family: Never    Frequency of Social Gatherings with Friends and Family: Once a week    Attends Religious Services: Never    Marine scientist or Organizations: No    Attends Archivist Meetings: Never    Marital Status: Divorced  Human resources officer Violence: Not At Risk (02/27/2022)   Humiliation, Afraid, Rape, and Kick questionnaire    Fear of Current or Ex-Partner: No    Emotionally Abused: No    Physically Abused: No    Sexually Abused: No    Review of Systems: See HPI, otherwise negative ROS  Physical Exam: BP (!) 148/70   Pulse 65   Temp 97.6 F (36.4 C) (Temporal)   Resp 17   Ht '6\' 2"'$  (1.88 m)   Wt 79.8 kg   SpO2 99%   BMI 22.60 kg/m  General:   Alert,  pleasant and cooperative in NAD Head:  Normocephalic and atraumatic. Neck:  Supple; no masses or thyromegaly. Lungs:  Clear throughout to auscultation, normal respiratory effort.    Heart:  +S1, +S2, Regular rate and rhythm, No edema. Abdomen:  Soft, nontender and nondistended. Normal bowel sounds, without guarding, and without rebound.   Neurologic:  Alert and  oriented x4;  grossly normal neurologically.  Impression/Plan: Mark Le is here for an endoscopy  to be performed for  evaluation of esophageal varices in setting of liver cirrhosis    Risks, benefits, limitations, and alternatives regarding  endoscopy have been reviewed with the patient.  Questions have been answered.  All parties agreeable.   Mark Bellows, MD  03/07/2022, 8:04 AM

## 2022-03-07 NOTE — Anesthesia Procedure Notes (Signed)
Procedure Name: General with mask airway Date/Time: 03/07/2022 8:20 AM  Performed by: Kelton Pillar, CRNAPre-anesthesia Checklist: Patient identified, Emergency Drugs available, Suction available and Patient being monitored Patient Re-evaluated:Patient Re-evaluated prior to induction Oxygen Delivery Method: Simple face mask Induction Type: IV induction Placement Confirmation: positive ETCO2, CO2 detector and breath sounds checked- equal and bilateral Dental Injury: Teeth and Oropharynx as per pre-operative assessment

## 2022-03-08 ENCOUNTER — Encounter: Payer: Self-pay | Admitting: Gastroenterology

## 2022-03-13 DIAGNOSIS — E119 Type 2 diabetes mellitus without complications: Secondary | ICD-10-CM | POA: Diagnosis not present

## 2022-03-13 DIAGNOSIS — H353134 Nonexudative age-related macular degeneration, bilateral, advanced atrophic with subfoveal involvement: Secondary | ICD-10-CM | POA: Diagnosis not present

## 2022-03-13 DIAGNOSIS — H43813 Vitreous degeneration, bilateral: Secondary | ICD-10-CM | POA: Diagnosis not present

## 2022-03-20 ENCOUNTER — Ambulatory Visit
Admission: RE | Admit: 2022-03-20 | Discharge: 2022-03-20 | Disposition: A | Discharge status: Home / Self Care | Payer: Medicare Other | Source: Ambulatory Visit | Attending: Family Medicine | Admitting: Family Medicine

## 2022-03-20 ENCOUNTER — Ambulatory Visit (INDEPENDENT_AMBULATORY_CARE_PROVIDER_SITE_OTHER): Payer: Medicare Other | Admitting: Family Medicine

## 2022-03-20 ENCOUNTER — Encounter: Payer: Self-pay | Admitting: Family Medicine

## 2022-03-20 VITALS — BP 120/70 | HR 67 | Temp 98.3°F | Ht 72.2 in | Wt 176.4 lb

## 2022-03-20 DIAGNOSIS — W19XXXA Unspecified fall, initial encounter: Secondary | ICD-10-CM

## 2022-03-20 DIAGNOSIS — S9001XA Contusion of right ankle, initial encounter: Secondary | ICD-10-CM | POA: Diagnosis not present

## 2022-03-20 DIAGNOSIS — S069X1A Unspecified intracranial injury with loss of consciousness of 30 minutes or less, initial encounter: Secondary | ICD-10-CM | POA: Insufficient documentation

## 2022-03-20 DIAGNOSIS — S0003XA Contusion of scalp, initial encounter: Secondary | ICD-10-CM | POA: Diagnosis not present

## 2022-03-20 DIAGNOSIS — T148XXA Other injury of unspecified body region, initial encounter: Secondary | ICD-10-CM | POA: Diagnosis not present

## 2022-03-20 NOTE — Assessment & Plan Note (Signed)
Acute, no clear suggestion of fracture.  Normal gait and no decreased range of motion, mild tenderness over bruising at the calf. Elevate foot above heart.

## 2022-03-20 NOTE — Assessment & Plan Note (Addendum)
Acute, minimal headache and no neurologic changes beyond baseline.  Given age, aspirin 81 mg daily and several minutes of loss of consciousness.   We will send for  Stat head CT to rule out intracranial bleed .  Reviewed signs and symptoms of intracranial bleed with sister including symptoms from slow subdural bleed.

## 2022-03-20 NOTE — Assessment & Plan Note (Signed)
Acute, swelling in right forehead seems to be decreasing per patient, minimally tender and no current sign of associated infection.

## 2022-03-20 NOTE — Progress Notes (Signed)
Patient ID: Mark Le, male    DOB: 1935-02-20, 87 y.o.   MRN: [id number]  This visit was conducted in person.  BP 120/70   Pulse 67   Temp 98.3 F (36.8 C) (Oral)   Ht 6' 0.2" (1.834 m)   Wt 176 lb 6 oz (80 kg)   SpO2 98%   BMI 23.79 kg/m    CC:  Chief Complaint  Patient presents with   Fall    Last Tuesday     Knot on Forehead    Subjective:   HPI: Mark Le is a 87 y.o. male patient of Dr. Verda Cumins with history of diabetes, hypertension, hepatic cirrhosis, dementia and chronic diastolic heart failure presenting on 03/20/2022 for Fall (Last Tuesday//) and Knot on Forehead  He presents to the office visit today with family member: sister.  Family reports a fall 1 week ago.  Tripped on dining room chair.. fell on floor, hit head on floor.. was unconscious for  2 minutes. No preceding issues.Marland Kitchen no palpitations, dizziness, CP, SOB etc.  Hit  and fell on right foot as fell as well.    When he came to he was disoriented, resolved once sisters son arrived.  Swelling  started on right forehead.   Now swelling seems to be decreasing in size overall.  Has change color somewhat in last few days. Bruising on face    He denies headache. No increasing confusion, per sister at baseline mental status.  No new numbness or weakness.  No change in gait.  Right face mildly sore to touch. No fever.  No heat in knot on right face.        Relevant past medical, surgical, family and social history reviewed and updated as indicated. Interim medical history since our last visit reviewed. Allergies and medications reviewed and updated. Outpatient Medications Prior to Visit  Medication Sig Dispense Refill   aspirin 81 MG tablet Take 81 mg by mouth daily.      carvedilol (COREG) 12.5 MG tablet Take 1 tablet (12.5 mg total) by mouth 2 (two) times daily with a meal. 180 tablet 1   donepezil (ARICEPT) 10 MG tablet TAKE 1 TABLET BY MOUTH EVERY DAY 90 tablet 0   lactulose (CHRONULAC)  10 GM/15ML solution Take 30 mLs (20 g total) by mouth 2 (two) times daily. 1800 mL 3   lisinopril (ZESTRIL) 10 MG tablet Take 1 tablet (10 mg total) by mouth daily. 90 tablet 1   Multiple Vitamins-Minerals (ONE-A-DAY MENS 50+ ADVANTAGE) TABS Take 1 tablet by mouth daily.     Multiple Vitamins-Minerals (PRESERVISION AREDS 2) CAPS Take 1 capsule by mouth 2 (two) times daily.     simvastatin (ZOCOR) 20 MG tablet Take 1 tablet (20 mg total) by mouth at bedtime. 90 tablet 1   vitamin B-12 (CYANOCOBALAMIN) 1000 MCG tablet Take 1 tablet (1,000 mcg total) by mouth daily. 90 tablet 1   No facility-administered medications prior to visit.     Per HPI unless specifically indicated in ROS section below Review of Systems  Constitutional:  Negative for fatigue and fever.  HENT:  Negative for ear pain.   Eyes:  Negative for pain.  Respiratory:  Negative for cough and shortness of breath.   Cardiovascular:  Negative for chest pain, palpitations and leg swelling.  Gastrointestinal:  Negative for abdominal pain.  Genitourinary:  Negative for dysuria.  Musculoskeletal:  Negative for arthralgias.  Neurological:  Negative for syncope, light-headedness and headaches.  Psychiatric/Behavioral:  Negative for dysphoric mood.    Objective:  BP 120/70   Pulse 67   Temp 98.3 F (36.8 C) (Oral)   Ht 6' 0.2" (1.834 m)   Wt 176 lb 6 oz (80 kg)   SpO2 98%   BMI 23.79 kg/m   Wt Readings from Last 3 Encounters:  03/20/22 176 lb 6 oz (80 kg)  03/07/22 176 lb (79.8 kg)  02/28/22 173 lb 4 oz (78.6 kg)      Physical Exam Constitutional:      Appearance: He is well-developed.  HENT:     Head: Normocephalic. Contusion present.     Comments:  Hematoma on right forehead, contusion  around eyes and along neck.  No tenderness to palpation.    Right Ear: Hearing normal.     Left Ear: Hearing normal.     Nose: Nose normal.  Neck:     Thyroid: No thyroid mass or thyromegaly.     Vascular: No carotid bruit.      Trachea: Trachea normal.  Cardiovascular:     Rate and Rhythm: Normal rate and regular rhythm.     Pulses: Normal pulses.     Heart sounds: Heart sounds not distant. No murmur heard.    No friction rub. No gallop.     Comments: No peripheral edema Pulmonary:     Effort: Pulmonary effort is normal. No respiratory distress.     Breath sounds: Normal breath sounds.  Musculoskeletal:     Comments: Bilateral swelling, 2 plus with pitting ( chronic per sister0  Contusion on right posterior calf and , ankle, ansand at base of toes  Skin:    General: Skin is warm and dry.     Findings: No rash.  Neurological:     Mental Status: He is alert. Mental status is at baseline.     Cranial Nerves: Cranial nerves 2-12 are intact.     Sensory: Sensation is intact.     Motor: Motor function is intact.     Deep Tendon Reflexes: Reflexes are normal and symmetric.  Psychiatric:        Speech: Speech normal.        Behavior: Behavior normal. Behavior is cooperative.        Thought Content: Thought content normal.        Results for orders placed or performed in visit on 02/12/22  Hepatitis B Surface AntiBODY  Result Value Ref Range   Hep B Surface Ab, Qual Non Reactive   Hepatitis A Ab, Total  Result Value Ref Range   hep A Total Ab Negative Negative  B12 and Folate Panel  Result Value Ref Range   Vitamin B-12 1,104 232 - 1,245 pg/mL   Folate 10.6 >3.0 ng/mL  AFP tumor marker  Result Value Ref Range   AFP, Serum, Tumor Marker <1.8 0.0 - 6.4 ng/mL     COVID 19 screen:  No recent travel or known exposure to COVID19 The patient denies respiratory symptoms of COVID 19 at this time. The importance of social distancing was discussed today.   Assessment and Plan    Problem List Items Addressed This Visit     Contusion of right ankle    Acute, no clear suggestion of fracture.  Normal gait and no decreased range of motion, mild tenderness over bruising at the calf. Elevate foot above  heart.      Head injury, closed, with brief LOC ([place]) - Primary    Acute, minimal headache and no  neurologic changes beyond baseline.  Given age, aspirin 81 mg daily and several minutes of loss of consciousness.   We will send for  Stat head CT to rule out intracranial bleed .  Reviewed signs and symptoms of intracranial bleed with sister including symptoms from slow subdural bleed.       Relevant Orders   CT HEAD WO CONTRAST (5MM)   Hematoma    Acute, swelling in right forehead seems to be decreasing per patient, minimally tender and no current sign of associated infection.        Other Visit Diagnoses     Accidental fall, initial encounter           Eliezer Lofts, MD

## 2022-03-20 NOTE — Patient Instructions (Signed)
Please call or go to ER if signs of mental status changes, severe headache, fever.  Apply ice to right leg, elevate and can use tylenol for pain.

## 2022-03-21 ENCOUNTER — Other Ambulatory Visit: Payer: Self-pay | Admitting: Family Medicine

## 2022-03-21 DIAGNOSIS — S069X1A Unspecified intracranial injury with loss of consciousness of 30 minutes or less, initial encounter: Secondary | ICD-10-CM

## 2022-03-21 DIAGNOSIS — I6201 Nontraumatic acute subdural hemorrhage: Secondary | ICD-10-CM | POA: Insufficient documentation

## 2022-03-21 DIAGNOSIS — W19XXXA Unspecified fall, initial encounter: Secondary | ICD-10-CM | POA: Insufficient documentation

## 2022-03-23 DIAGNOSIS — S060X1D Concussion with loss of consciousness of 30 minutes or less, subsequent encounter: Secondary | ICD-10-CM | POA: Diagnosis not present

## 2022-04-03 DIAGNOSIS — H353134 Nonexudative age-related macular degeneration, bilateral, advanced atrophic with subfoveal involvement: Secondary | ICD-10-CM | POA: Diagnosis not present

## 2022-04-04 NOTE — Progress Notes (Signed)
Needs hep A/ B vaccine

## 2022-04-11 ENCOUNTER — Other Ambulatory Visit: Payer: Self-pay | Admitting: Family Medicine

## 2022-04-12 ENCOUNTER — Ambulatory Visit (INDEPENDENT_AMBULATORY_CARE_PROVIDER_SITE_OTHER): Payer: Medicare Other | Admitting: Family Medicine

## 2022-04-12 ENCOUNTER — Encounter: Payer: Self-pay | Admitting: Family Medicine

## 2022-04-12 ENCOUNTER — Telehealth: Payer: Self-pay | Admitting: Family Medicine

## 2022-04-12 VITALS — BP 130/60 | HR 58 | Temp 97.9°F | Ht 72.2 in | Wt 176.2 lb

## 2022-04-12 DIAGNOSIS — Z85038 Personal history of other malignant neoplasm of large intestine: Secondary | ICD-10-CM

## 2022-04-12 DIAGNOSIS — I10 Essential (primary) hypertension: Secondary | ICD-10-CM | POA: Diagnosis not present

## 2022-04-12 DIAGNOSIS — I712 Thoracic aortic aneurysm, without rupture, unspecified: Secondary | ICD-10-CM

## 2022-04-12 DIAGNOSIS — H3530 Unspecified macular degeneration: Secondary | ICD-10-CM | POA: Diagnosis not present

## 2022-04-12 DIAGNOSIS — I6201 Nontraumatic acute subdural hemorrhage: Secondary | ICD-10-CM

## 2022-04-12 DIAGNOSIS — Z23 Encounter for immunization: Secondary | ICD-10-CM

## 2022-04-12 DIAGNOSIS — E781 Pure hyperglyceridemia: Secondary | ICD-10-CM | POA: Diagnosis not present

## 2022-04-12 DIAGNOSIS — R208 Other disturbances of skin sensation: Secondary | ICD-10-CM | POA: Diagnosis not present

## 2022-04-12 DIAGNOSIS — E1169 Type 2 diabetes mellitus with other specified complication: Secondary | ICD-10-CM

## 2022-04-12 DIAGNOSIS — I5032 Chronic diastolic (congestive) heart failure: Secondary | ICD-10-CM | POA: Diagnosis not present

## 2022-04-12 DIAGNOSIS — F03B0 Unspecified dementia, moderate, without behavioral disturbance, psychotic disturbance, mood disturbance, and anxiety: Secondary | ICD-10-CM

## 2022-04-12 DIAGNOSIS — I700 Atherosclerosis of aorta: Secondary | ICD-10-CM

## 2022-04-12 DIAGNOSIS — K7030 Alcoholic cirrhosis of liver without ascites: Secondary | ICD-10-CM | POA: Diagnosis not present

## 2022-04-12 DIAGNOSIS — I6203 Nontraumatic chronic subdural hemorrhage: Secondary | ICD-10-CM

## 2022-04-12 DIAGNOSIS — E538 Deficiency of other specified B group vitamins: Secondary | ICD-10-CM

## 2022-04-12 NOTE — Assessment & Plan Note (Addendum)
2020 CT: Slight dilatation of the ascending thoracic aorta to a diameter 4.1 cm, unchanged since the prior study in 2018.  Discussed this diagnosis in detail with patient.  He and sister both agree that he does not want to follow this aggressively.  He states he will not likely do cardiovascular thoracic surgery if it was indicated with increase in size of aneurysm.  He understands the risk of aneurysm rupture if the aneurysm increases in size. They have been informed completely and have chosen to no longer follow this with chest CT or referral to cardiology/vascular.

## 2022-04-12 NOTE — Assessment & Plan Note (Signed)
well controlled on simvastatin 20 mg daily.

## 2022-04-12 NOTE — Telephone Encounter (Signed)
Lft pt vm to call [phone] press option 3 and then 2 to sch . thanks 

## 2022-04-12 NOTE — Assessment & Plan Note (Signed)
Compensated cirrhosis, no alcohol use followed by GI. Following for increased risk of esophageal varices with EGD every 2 years. None seen on EGD 03/09/2022

## 2022-04-12 NOTE — Assessment & Plan Note (Signed)
On asa 81 mg daily and statin.

## 2022-04-12 NOTE — Assessment & Plan Note (Signed)
Evaluated by neurosurgeon... no further treatment recommended as pt h=is assymptomatic.

## 2022-04-12 NOTE — Assessment & Plan Note (Addendum)
Diet controlled/ Resolve with weight loss.  Lab Results  Component Value Date   HGBA1C 5.2 10/16/2021

## 2022-04-12 NOTE — Assessment & Plan Note (Signed)
Not too bothersome..neurology was not sure what was causing sense of vibration in feet. B12 may have helped some.

## 2022-04-12 NOTE — Assessment & Plan Note (Signed)
Stable, chronic.  Continue current medication.   Well controlled on coreg 12.5 mg BID, and lisinopril 10 mg daily

## 2022-04-12 NOTE — Addendum Note (Signed)
Addended by: Carter Kitten on: 04/12/2022 12:29 PM   Modules accepted: Orders

## 2022-04-12 NOTE — Assessment & Plan Note (Signed)
On B12 supplement.

## 2022-04-12 NOTE — Assessment & Plan Note (Signed)
Hx of in 2004. Had surgery and chemotherapy.

## 2022-04-12 NOTE — Assessment & Plan Note (Signed)
Followed by [facility]  Getting injections.

## 2022-04-12 NOTE — Assessment & Plan Note (Signed)
Last ECHO 2018. Stable pedal edema, not on lasix and unable to  wear compression hose.   Today 1 plus pitting edema.

## 2022-04-12 NOTE — Assessment & Plan Note (Signed)
Stable, mild... followed by neurology, Dr. Manuella Ghazi.  On aricept 10 mg daily

## 2022-04-12 NOTE — Progress Notes (Signed)
Patient ID: Mark Le, male    DOB: 1934/10/13, 87 y.o.   MRN: [id number]  This visit was conducted in person. Blood pressure 130/60, pulse (!) 58, temperature 97.9 F (36.6 C), temperature source Oral, height 6' 0.2" (1.834 m), weight 176 lb 4 oz (79.9 kg), SpO2 97 %.   CC:  Subjective:   HPI: Mark Le is a 87 y.o. male presenting on 04/12/2022 for [facility] Last CPX with Dr. Einar Pheasant: 02/28/2022 reviewed.  He presents for transfer of care from Dr. Einar Pheasant to myself. He presents with his sister as historian.   I recently saw him on 03/20/2022 following a fall ... Resulting in concern for acute on chronic subdural hematoma versus hematohygroma.  Past MRI per Dr. Manuella Ghazi neurologist did not note these changes so they were felt to be acute. He held aspirin and was referral to neurosurgeon who recommended watchful waiting.  He feels well with no headache, no neuro changes. He has restarted aspirin.   Compensated cirrhosis, no alcohol use followed by GI. Following for increased risk of esophageal varices with EGD every 2 years. None seen on EGD 03/09/2022    Chronic diastolic heart failure: Last ECHO 2018. Stable pedal edema, not on lasix and unable to  wear compression hose.  Dementia: Followed by neurology. Stable, mild per sister  On aricept mg daily He lives with sister now.   Diabetes, improved since weight loss when was not eating.  Good appetite now. Wt Readings from Last 3 Encounters:  04/12/22 176 lb 4 oz (79.9 kg)  03/20/22 176 lb 6 oz (80 kg)  03/07/22 176 lb (79.8 kg)  No foot ulcers eye exam within last year: yes  Hypertension:   Well controlled on coreg 12.5 mg BID, and lisinopril 10 mg daily BP Readings from Last 3 Encounters:  04/12/22 130/60  03/20/22 120/70  03/07/22 (!) 144/74  Using medication without problems or lightheadedness:  none Chest pain with exertion:none Edema: none Short of breath:none Average home BPs: Other issues:  Elevated Cholesterol:  well  controlled on simvastatin 20 mg daily. Lab Results  Component Value Date   CHOL 129 10/16/2021   HDL 33.20 (L) 10/16/2021   LDLCALC 65 10/16/2021   LDLDIRECT 81 01/21/2008   TRIG 156.0 (H) 10/16/2021   CHOLHDL 4 10/16/2021      Relevant past medical, surgical, family and social history reviewed and updated as indicated. Interim medical history since our last visit reviewed. Allergies and medications reviewed and updated. Outpatient Medications Prior to Visit  Medication Sig Dispense Refill   aspirin 81 MG tablet Take 81 mg by mouth daily.      carvedilol (COREG) 12.5 MG tablet Take 1 tablet (12.5 mg total) by mouth 2 (two) times daily with a meal. 180 tablet 1   donepezil (ARICEPT) 10 MG tablet TAKE 1 TABLET BY MOUTH EVERY DAY 90 tablet 0   lactulose (CHRONULAC) 10 GM/15ML solution Take 30 mLs (20 g total) by mouth 2 (two) times daily. 1800 mL 3   lisinopril (ZESTRIL) 10 MG tablet Take 1 tablet (10 mg total) by mouth daily. 90 tablet 1   Multiple Vitamins-Minerals (ONE-A-DAY MENS 50+ ADVANTAGE) TABS Take 1 tablet by mouth daily.     Multiple Vitamins-Minerals (PRESERVISION AREDS 2) CAPS Take 1 capsule by mouth 2 (two) times daily.     simvastatin (ZOCOR) 20 MG tablet Take 1 tablet (20 mg total) by mouth at bedtime. 90 tablet 1   vitamin B-12 (CYANOCOBALAMIN) 1000 MCG  tablet Take 1 tablet (1,000 mcg total) by mouth daily. 90 tablet 1   No facility-administered medications prior to visit.     Per HPI unless specifically indicated in ROS section below Review of Systems  Constitutional:  Negative for fatigue and fever.  HENT:  Negative for ear pain.   Eyes:  Negative for pain.  Respiratory:  Negative for cough and shortness of breath.   Cardiovascular:  Negative for chest pain, palpitations and leg swelling.  Gastrointestinal:  Negative for abdominal pain.  Genitourinary:  Negative for dysuria.  Musculoskeletal:  Negative for arthralgias.  Neurological:  Negative for syncope,  light-headedness and headaches.  Psychiatric/Behavioral:  Negative for dysphoric mood.    Objective:  There were no vitals taken for this visit.  Wt Readings from Last 3 Encounters:  03/20/22 176 lb 6 oz (80 kg)  03/07/22 176 lb (79.8 kg)  02/28/22 173 lb 4 oz (78.6 kg)      Physical Exam Constitutional:      Appearance: He is well-developed and normal weight.  HENT:     Head: Normocephalic.     Right Ear: Hearing normal.     Left Ear: Hearing normal.     Nose: Nose normal.  Neck:     Thyroid: No thyroid mass or thyromegaly.     Vascular: No carotid bruit.     Trachea: Trachea normal.  Cardiovascular:     Rate and Rhythm: Normal rate and regular rhythm.     Pulses: Normal pulses.     Heart sounds: Heart sounds not distant. No murmur heard.    No friction rub. No gallop.     Comments: No peripheral edema Pulmonary:     Effort: Pulmonary effort is normal. No respiratory distress.     Breath sounds: Normal breath sounds.  Skin:    General: Skin is warm and dry.     Findings: No rash.     Comments: Improved contusion on face and decreased size of hematoma on right forehead.  Neurological:     General: No focal deficit present.     Mental Status: He is alert and oriented to person, place, and time.     Cranial Nerves: Cranial nerves 2-12 are intact.     Sensory: Sensation is intact.     Motor: Motor function is intact.  Psychiatric:        Attention and Perception: Attention normal.        Mood and Affect: Affect is flat.        Speech: Speech normal.        Behavior: Behavior normal. Behavior is cooperative.        Thought Content: Thought content normal.        Cognition and Memory: Cognition is not impaired. Memory is not impaired. He exhibits impaired recent memory.       Results for orders placed or performed in visit on 02/12/22  Hepatitis B Surface AntiBODY  Result Value Ref Range   Hep B Surface Ab, Qual Non Reactive   Hepatitis A Ab, Total  Result Value  Ref Range   hep A Total Ab Negative Negative  B12 and Folate Panel  Result Value Ref Range   Vitamin B-12 1,104 232 - 1,245 pg/mL   Folate 10.6 >3.0 ng/mL  AFP tumor marker  Result Value Ref Range   AFP, Serum, Tumor Marker <1.8 0.0 - 6.4 ng/mL     COVID 19 screen:  No recent travel or known exposure to COVID19  The patient denies respiratory symptoms of COVID 19 at this time. The importance of social distancing was discussed today.   Assessment and Plan    Problem List Items Addressed This Visit     Acute on chronic intracranial subdural hematoma ([place])     Evaluated by neurosurgeon... no further treatment recommended as pt h=is assymptomatic.       Aortic atherosclerosis (HCC) - Primary     On asa 81 mg daily and statin.      Chronic diastolic heart failure ([place])    Last ECHO 2018. Stable pedal edema, not on lasix and unable to  wear compression hose.   Today 1 plus pitting edema.      Decreased sense of vibration     Not too bothersome..neurology was not sure what was causing sense of vibration in feet. B12 may have helped some.      Dementia (HCC)     Stable, mild... followed by neurology, Dr. Manuella Ghazi.  On aricept 10 mg daily      DM (diabetes mellitus) - diet controlled     Diet controlled/ Resolve with weight loss.  Lab Results  Component Value Date   HGBA1C 5.2 10/16/2021        Hepatic cirrhosis ([place])    Compensated cirrhosis, no alcohol use followed by GI. Following for increased risk of esophageal varices with EGD every 2 years. None seen on EGD 03/09/2022         History of colon cancer     Hx of in 2004. Had surgery and chemotherapy.       HYPERTENSION, BENIGN SYSTEMIC    Stable, chronic.  Continue current medication.   Well controlled on coreg 12.5 mg BID, and lisinopril 10 mg daily      HYPERTRIGLYCERIDEMIA    well controlled on simvastatin 20 mg daily.      Macular degeneration, bilateral     Followed by [facility]   Getting injections.      Thoracic aortic aneurysm ([place])     2020 CT: Slight dilatation of the ascending thoracic aorta to a diameter 4.1 cm, unchanged since the prior study in 2018.  Discussed this diagnosis in detail with patient.  He and sister both agree that he does not want to follow this aggressively.  He states he will not likely do cardiovascular thoracic surgery if it was indicated with increase in size of aneurysm.  He understands the risk of aneurysm rupture if the aneurysm increases in size. They have been informed completely and have chosen to no longer follow this with chest CT or referral to cardiology/vascular.      Vitamin B12 deficiency     On B12 supplement.       Given Prevnar 20 and HD flu shot today.  Eliezer Lofts, MD

## 2022-04-19 ENCOUNTER — Encounter: Payer: Self-pay | Admitting: *Deleted

## 2022-04-23 ENCOUNTER — Other Ambulatory Visit: Payer: Self-pay | Admitting: Family Medicine

## 2022-04-23 DIAGNOSIS — E781 Pure hyperglyceridemia: Secondary | ICD-10-CM

## 2022-04-26 ENCOUNTER — Telehealth: Payer: Self-pay | Admitting: Family Medicine

## 2022-04-26 NOTE — Telephone Encounter (Signed)
Is this MRI Brain still needed?   Pt has CT Head back in September. From yoour notes patient had an MRI in July and is now seeing a Publishing rights manager.   Patient/Family is stating that he has already had this scan done, I am not sure if they are referring to the CT Head that was done last month or if they have had a repeat MRI with the Neurosurgeon.  I have sent a message back to the patient/family to try and find out.

## 2022-04-26 NOTE — Telephone Encounter (Signed)
I believe since this ordered is dated, the patient already had MRI Brain done in July (by Neurology Dr Manuella Ghazi)  This is an old order. I do not believe a repeat MRI is needed at this time and this order can be cancelled.

## 2022-04-26 NOTE — Telephone Encounter (Signed)
Spoke with Meryl Crutch and schedule Mr. Rihn a nurse visit 04/27/2022 at 2:00 pm for Hepatitis A and B vaccines

## 2022-04-26 NOTE — Telephone Encounter (Signed)
Patient sister Meryl Crutch called in and stated that patient needs a Hep B vaccine. Please advise. Thank you!

## 2022-04-27 ENCOUNTER — Ambulatory Visit (INDEPENDENT_AMBULATORY_CARE_PROVIDER_SITE_OTHER): Payer: Medicare Other

## 2022-04-27 DIAGNOSIS — Z23 Encounter for immunization: Secondary | ICD-10-CM | POA: Diagnosis not present

## 2022-04-27 NOTE — Telephone Encounter (Signed)
Per pts immunization  record pt had Hep A/B on 01/30/2018, 03/02/2018 and 08/02/2018.

## 2022-05-11 ENCOUNTER — Other Ambulatory Visit: Payer: Self-pay

## 2022-05-11 DIAGNOSIS — I10 Essential (primary) hypertension: Secondary | ICD-10-CM

## 2022-05-11 MED ORDER — LISINOPRIL 10 MG PO TABS: 10.0000 mg | ORAL_TABLET | Freq: Every day | ORAL | 1 refills | Status: DC

## 2022-05-15 ENCOUNTER — Other Ambulatory Visit: Payer: Self-pay | Admitting: *Deleted

## 2022-05-15 DIAGNOSIS — E538 Deficiency of other specified B group vitamins: Secondary | ICD-10-CM

## 2022-05-15 DIAGNOSIS — I10 Essential (primary) hypertension: Secondary | ICD-10-CM

## 2022-05-15 NOTE — Telephone Encounter (Signed)
Last office visit 04/12/22 for TOC.  Last refilled Carvedilol 10/16/2021 for #180 with 1 refill.  Vit B12 05/15/6f or #90 with 1 refill.  Last Vit B12 level 02/12/22 which was normal at 1,104 pg/mL.  No future appointments.

## 2022-05-16 MED ORDER — VITAMIN B-12 1000 MCG PO TABS: 1000.0000 ug | ORAL_TABLET | Freq: Every day | ORAL | 3 refills | Status: DC

## 2022-05-16 MED ORDER — CARVEDILOL 12.5 MG PO TABS
12.5000 mg | ORAL_TABLET | Freq: Two times a day (BID) | ORAL | 3 refills | Status: DC
Start: 2022-05-16 — End: 2023-05-02

## 2022-05-31 DIAGNOSIS — H353134 Nonexudative age-related macular degeneration, bilateral, advanced atrophic with subfoveal involvement: Secondary | ICD-10-CM | POA: Diagnosis not present

## 2022-05-31 DIAGNOSIS — H43813 Vitreous degeneration, bilateral: Secondary | ICD-10-CM | POA: Diagnosis not present

## 2022-05-31 DIAGNOSIS — E119 Type 2 diabetes mellitus without complications: Secondary | ICD-10-CM | POA: Diagnosis not present

## 2022-07-13 ENCOUNTER — Other Ambulatory Visit: Payer: Self-pay | Admitting: *Deleted

## 2022-07-13 MED ORDER — DONEPEZIL HCL 10 MG PO TABS: 10.0000 mg | ORAL_TABLET | Freq: Every day | ORAL | 3 refills | Status: DC

## 2022-07-24 DIAGNOSIS — H353124 Nonexudative age-related macular degeneration, left eye, advanced atrophic with subfoveal involvement: Secondary | ICD-10-CM | POA: Diagnosis not present

## 2022-07-24 DIAGNOSIS — H353114 Nonexudative age-related macular degeneration, right eye, advanced atrophic with subfoveal involvement: Secondary | ICD-10-CM | POA: Diagnosis not present

## 2022-07-24 DIAGNOSIS — H43813 Vitreous degeneration, bilateral: Secondary | ICD-10-CM | POA: Diagnosis not present

## 2022-07-24 DIAGNOSIS — E119 Type 2 diabetes mellitus without complications: Secondary | ICD-10-CM | POA: Diagnosis not present

## 2022-07-31 DIAGNOSIS — Z9841 Cataract extraction status, right eye: Secondary | ICD-10-CM | POA: Diagnosis not present

## 2022-07-31 DIAGNOSIS — Z9842 Cataract extraction status, left eye: Secondary | ICD-10-CM | POA: Diagnosis not present

## 2022-07-31 DIAGNOSIS — H52222 Regular astigmatism, left eye: Secondary | ICD-10-CM | POA: Diagnosis not present

## 2022-07-31 DIAGNOSIS — H353134 Nonexudative age-related macular degeneration, bilateral, advanced atrophic with subfoveal involvement: Secondary | ICD-10-CM | POA: Diagnosis not present

## 2022-08-03 ENCOUNTER — Encounter: Payer: Self-pay | Admitting: Nurse Practitioner

## 2022-08-03 ENCOUNTER — Ambulatory Visit (INDEPENDENT_AMBULATORY_CARE_PROVIDER_SITE_OTHER): Payer: Medicare Other | Admitting: Nurse Practitioner

## 2022-08-03 DIAGNOSIS — U071 COVID-19: Secondary | ICD-10-CM | POA: Insufficient documentation

## 2022-08-03 MED ORDER — MOLNUPIRAVIR EUA 200MG CAPSULE: 4.0000 | ORAL_CAPSULE | Freq: Two times a day (BID) | ORAL | 0 refills | Status: AC

## 2022-08-03 NOTE — Patient Instructions (Addendum)
Quarantine through 08/05/2022. If you are fever free for 24 hours without the use of fever reducing medications and feeling better you can go out on Monday 08/06/2022 but need to wear a mask through 08/10/2022 while around other people

## 2022-08-03 NOTE — Progress Notes (Signed)
Patient ID: Mark Le, male    DOB: Oct 17, 1934, 88 y.o.   MRN: [id number]  Virtual visit completed through [employer], a video enabled telemedicine application. Due to national recommendations of social distancing due to COVID-19, a virtual visit is felt to be most appropriate for this patient at this time. Reviewed limitations, risks, security and privacy concerns of performing a virtual visit and the availability of in person appointments. I also reviewed that there may be a patient responsible charge related to this service. The patient agreed to proceed.   Patient location: home Provider location: [place] [facility], office Persons participating in this virtual visit: patient, provider, granddaughter  If any vitals were documented, they were collected by patient at home unless specified below.    There were no vitals taken for this visit.   CC: Covid 19 Subjective:   HPI: Mark Le is a 88 y.o. male presenting on 08/03/2022 for Covid Positive    Symptoms started on 07/31/2022 Tested positive on today  Covid vaccines: pfizer Flu vaccine UTD No sick contacts No over the counter medications for symptom management    Relevant past medical, surgical, family and social history reviewed and updated as indicated. Interim medical history since our last visit reviewed. Allergies and medications reviewed and updated. Outpatient Medications Prior to Visit  Medication Sig Dispense Refill   aspirin 81 MG tablet Take 81 mg by mouth daily.      carvedilol (COREG) 12.5 MG tablet Take 1 tablet (12.5 mg total) by mouth 2 (two) times daily with a meal. 180 tablet 3   cyanocobalamin (VITAMIN B12) 1000 MCG tablet Take 1 tablet (1,000 mcg total) by mouth daily. 90 tablet 3   donepezil (ARICEPT) 10 MG tablet Take 1 tablet (10 mg total) by mouth daily. 90 tablet 3   lisinopril (ZESTRIL) 10 MG tablet Take 1 tablet (10 mg total) by mouth daily. 90 tablet 1   Multiple Vitamins-Minerals  (ONE-A-DAY MENS 50+ ADVANTAGE) TABS Take 1 tablet by mouth daily.     Multiple Vitamins-Minerals (PRESERVISION AREDS 2) CAPS Take 1 capsule by mouth 2 (two) times daily.     simvastatin (ZOCOR) 20 MG tablet TAKE 1 TABLET BY MOUTH EVERYDAY AT BEDTIME 90 tablet 1   No facility-administered medications prior to visit.     Per HPI unless specifically indicated in ROS section below Review of Systems  Constitutional:  Positive for appetite change and fatigue. Negative for chills and fever.       Fluid intake is below normal   HENT:  Positive for sore throat. Negative for ear discharge and ear pain.   Respiratory:  Positive for cough. Negative for shortness of breath.   Gastrointestinal:  Positive for diarrhea. Negative for abdominal pain and vomiting.  Musculoskeletal:  Negative for arthralgias and myalgias.  Neurological:  Negative for headaches.   Objective:  There were no vitals taken for this visit.  Wt Readings from Last 3 Encounters:  04/12/22 176 lb 4 oz (79.9 kg)  03/20/22 176 lb 6 oz (80 kg)  03/07/22 176 lb (79.8 kg)       Physical exam: Gen: alert, NAD, not ill appearing Pulm: speaks in complete sentences without increased work of breathing Psych: normal mood, normal thought content      Results for orders placed or performed in visit on 02/12/22  Hepatitis B Surface AntiBODY  Result Value Ref Range   Hep B Surface Ab, Qual Non Reactive   Hepatitis A Ab,  Total  Result Value Ref Range   hep A Total Ab Negative Negative  B12 and Folate Panel  Result Value Ref Range   Vitamin B-12 1,104 232 - 1,245 pg/mL   Folate 10.6 >3.0 ng/mL  AFP tumor marker  Result Value Ref Range   AFP, Serum, Tumor Marker <1.8 0.0 - 6.4 ng/mL   Assessment & Plan:   COVID-19 Assessment & Plan: Patient tested positive for COVID-19 at home.  Did discuss antiviral treatments that they are EUA only.  Given no recent renal function within 6 months elected to use molnupiravir antiviral.  Did  discuss with patient CDC guidelines/recommendations in regards to self-isolation/quarantine.  Did discuss signs and symptoms when to be seen urgent or emergently.  Did discuss common side effects of molnupiravir use.  Patient's granddaughter was on the video call and all this information was discussed with her also.  Orders: -     molnupiravir EUA; Take 4 capsules (800 mg total) by mouth 2 (two) times daily for 5 days.  Dispense: 40 capsule; Refill: 0     I discussed the assessment and treatment plan with the patient. The patient was provided an opportunity to ask questions and all were answered. The patient agreed with the plan and demonstrated an understanding of the instructions. The patient was advised to call back or seek an in-person evaluation if the symptoms worsen or if the condition fails to improve as anticipated.  Follow up plan: Return if symptoms worsen or fail to improve.  [name], NP

## 2022-08-03 NOTE — Assessment & Plan Note (Signed)
Patient tested positive for COVID-19 at home.  Did discuss antiviral treatments that they are EUA only.  Given no recent renal function within 6 months elected to use molnupiravir antiviral.  Did discuss with patient CDC guidelines/recommendations in regards to self-isolation/quarantine.  Did discuss signs and symptoms when to be seen urgent or emergently.  Did discuss common side effects of molnupiravir use.  Patient's granddaughter was on the video call and all this information was discussed with her also.

## 2022-08-15 ENCOUNTER — Telehealth: Payer: Self-pay | Admitting: Cardiology

## 2022-08-15 NOTE — Telephone Encounter (Signed)
FYI.  °Contacted patient regarding recall appointment, patient notified our office they did not wish to keep this appointment at this time.  Deleted recall from system. °

## 2022-09-18 DIAGNOSIS — H43813 Vitreous degeneration, bilateral: Secondary | ICD-10-CM | POA: Diagnosis not present

## 2022-09-18 DIAGNOSIS — H353134 Nonexudative age-related macular degeneration, bilateral, advanced atrophic with subfoveal involvement: Secondary | ICD-10-CM | POA: Diagnosis not present

## 2022-09-18 DIAGNOSIS — E119 Type 2 diabetes mellitus without complications: Secondary | ICD-10-CM | POA: Diagnosis not present

## 2022-10-11 ENCOUNTER — Other Ambulatory Visit: Payer: Self-pay | Admitting: Family Medicine

## 2022-10-11 DIAGNOSIS — E781 Pure hyperglyceridemia: Secondary | ICD-10-CM

## 2022-11-02 ENCOUNTER — Other Ambulatory Visit: Payer: Self-pay | Admitting: Family Medicine

## 2022-11-02 DIAGNOSIS — I10 Essential (primary) hypertension: Secondary | ICD-10-CM

## 2022-11-13 DIAGNOSIS — H43813 Vitreous degeneration, bilateral: Secondary | ICD-10-CM | POA: Diagnosis not present

## 2022-11-13 DIAGNOSIS — E119 Type 2 diabetes mellitus without complications: Secondary | ICD-10-CM | POA: Diagnosis not present

## 2022-11-13 DIAGNOSIS — H353134 Nonexudative age-related macular degeneration, bilateral, advanced atrophic with subfoveal involvement: Secondary | ICD-10-CM | POA: Diagnosis not present

## 2022-11-14 ENCOUNTER — Encounter: Payer: Self-pay | Admitting: Family Medicine

## 2022-11-15 ENCOUNTER — Other Ambulatory Visit: Payer: Self-pay | Admitting: Gastroenterology

## 2022-11-15 ENCOUNTER — Other Ambulatory Visit: Payer: Self-pay | Admitting: Family Medicine

## 2022-11-15 DIAGNOSIS — E781 Pure hyperglyceridemia: Secondary | ICD-10-CM

## 2022-11-15 MED ORDER — ACCU-CHEK GUIDE CONTROL VI LIQD: 1.0000 | Freq: Once | 2 refills | Status: AC

## 2022-11-15 MED ORDER — ACCU-CHEK GUIDE VI STRP: ORAL_STRIP | 4 refills | Status: AC

## 2022-11-15 MED ORDER — ACCU-CHEK GUIDE W/DEVICE KIT
1.0000 | PACK | Freq: Once | 0 refills | Status: AC
Start: 2022-11-15 — End: 2022-11-15

## 2022-11-15 MED ORDER — ACCU-CHEK SOFTCLIX LANCETS MISC: 4 refills | Status: AC

## 2022-11-15 MED ORDER — ACCU-CHEK SOFTCLIX LANCET DEV KIT: 1.0000 | PACK | Freq: Once | 0 refills | Status: AC

## 2022-11-15 NOTE — Telephone Encounter (Signed)
Yes.. okay to send rx as requested for patient.

## 2022-11-15 NOTE — Addendum Note (Signed)
Addended by: Eual Fines on: 11/15/2022 09:56 AM   Modules accepted: Orders

## 2022-11-15 NOTE — Telephone Encounter (Signed)
Ok to send in script for patient?

## 2022-11-16 NOTE — Telephone Encounter (Signed)
Refill request Simvastatin Last lipid lab work 10/17/22 checked by Dr. Selena Batten Last refill 10/11/19 #90 Last office visit 07/1922 acute No upcoming appointment scheduled.

## 2022-11-16 NOTE — Telephone Encounter (Signed)
Dr. Ermalene Searing aware when last labs were done and she will check the next time that he comes in.

## 2022-11-16 NOTE — Telephone Encounter (Signed)
Sorry, just realized that wrong date was put on note regarding last labs which was actually 10/16/21.  Should patient be scheduled a lab appointment?

## 2023-01-09 DIAGNOSIS — H353113 Nonexudative age-related macular degeneration, right eye, advanced atrophic without subfoveal involvement: Secondary | ICD-10-CM | POA: Diagnosis not present

## 2023-01-09 DIAGNOSIS — E119 Type 2 diabetes mellitus without complications: Secondary | ICD-10-CM | POA: Diagnosis not present

## 2023-01-09 DIAGNOSIS — H353124 Nonexudative age-related macular degeneration, left eye, advanced atrophic with subfoveal involvement: Secondary | ICD-10-CM | POA: Diagnosis not present

## 2023-01-09 DIAGNOSIS — E113292 Type 2 diabetes mellitus with mild nonproliferative diabetic retinopathy without macular edema, left eye: Secondary | ICD-10-CM | POA: Diagnosis not present

## 2023-01-09 DIAGNOSIS — H43813 Vitreous degeneration, bilateral: Secondary | ICD-10-CM | POA: Diagnosis not present

## 2023-01-30 ENCOUNTER — Ambulatory Visit: Payer: Medicare Other

## 2023-01-30 ENCOUNTER — Telehealth: Payer: Self-pay | Admitting: *Deleted

## 2023-01-30 VITALS — BP 132/60 | HR 58 | Ht 74.0 in | Wt 178.0 lb

## 2023-01-30 DIAGNOSIS — E781 Pure hyperglyceridemia: Secondary | ICD-10-CM

## 2023-01-30 DIAGNOSIS — Z0000 Encounter for general adult medical examination without abnormal findings: Secondary | ICD-10-CM | POA: Diagnosis not present

## 2023-01-30 DIAGNOSIS — K7030 Alcoholic cirrhosis of liver without ascites: Secondary | ICD-10-CM

## 2023-01-30 DIAGNOSIS — E1169 Type 2 diabetes mellitus with other specified complication: Secondary | ICD-10-CM

## 2023-01-30 DIAGNOSIS — E538 Deficiency of other specified B group vitamins: Secondary | ICD-10-CM

## 2023-01-30 NOTE — Telephone Encounter (Signed)
-----   Message from Alvina Chou sent at 01/30/2023  3:02 PM EDT ----- Regarding: Lab orders for Wednesday, 7.24.24 Patient is scheduled for CPX labs, please order future labs, Thanks , Camelia Eng

## 2023-01-30 NOTE — Progress Notes (Signed)
Subjective:   Mark Le is a 88 y.o. male who presents for Medicare Annual/Subsequent preventive examination.  Visit Complete: In person   Review of Systems      Cardiac Risk Factors include: advanced age (>50men, >69 women);diabetes mellitus;sedentary lifestyle;dyslipidemia;male gender     Objective:    Today's Vitals   01/30/23 1119  BP: 132/60  Pulse: (!) 58  SpO2: 97%  Weight: 178 lb (80.7 kg)  Height: 6\' 2"  (1.88 m)   Body mass index is 22.85 kg/m.     01/30/2023   11:34 AM 03/07/2022    7:13 AM 02/27/2022   10:46 AM 04/26/2017    8:50 AM 04/26/2017    7:42 AM 04/19/2017    8:16 AM 12/27/2016    9:21 AM  Advanced Directives  Does Patient Have a Medical Advance Directive? Yes Yes Yes Yes Yes Yes No  Type of Estate agent of [place];Living will Healthcare Power of [employer] Power of [employer] Power of [place];Living will Healthcare Power of [place];Living will Healthcare Power of [place];Living will   Does patient want to make changes to medical advance directive?   No - Patient declined  No - Patient declined No - Patient declined   Copy of Healthcare Power of Attorney in Chart? No - copy requested  No - copy requested No - copy requested No - copy requested    Would patient like information on creating a medical advance directive?       No - Patient declined    Current Medications (verified) Outpatient Encounter Medications as of 01/30/2023  Medication Sig   Accu-Chek Softclix Lancets lancets Use to obtain blood sugar sample once daily Dx Code E11.9   aspirin 81 MG tablet Take 81 mg by mouth daily.    carvedilol (COREG) 12.5 MG tablet Take 1 tablet (12.5 mg total) by mouth 2 (two) times daily with a meal.   cyanocobalamin (VITAMIN B12) 1000 MCG tablet Take 1 tablet (1,000 mcg total) by mouth daily.   donepezil (ARICEPT) 10 MG tablet Take 1 tablet (10 mg total) by mouth daily.   glucose blood (ACCU-CHEK GUIDE) test  strip Use to check blood sugar once daily. Dx Code E11.9   lisinopril (ZESTRIL) 10 MG tablet TAKE 1 TABLET BY MOUTH EVERY DAY   Multiple Vitamins-Minerals (ONE-A-DAY MENS 50+ ADVANTAGE) TABS Take 1 tablet by mouth daily.   Multiple Vitamins-Minerals (PRESERVISION AREDS 2) CAPS Take 1 capsule by mouth 2 (two) times daily.   simvastatin (ZOCOR) 20 MG tablet TAKE 1 TABLET BY MOUTH EVERYDAY AT BEDTIME   lactulose (CHRONULAC) 10 GM/15ML solution TAKE 30 MLS (20 G TOTAL) BY MOUTH 2 (TWO) TIMES DAILY. (Patient not taking: Reported on 01/30/2023)   No facility-administered encounter medications on file as of 01/30/2023.    Allergies (verified) Iohexol   History: Past Medical History:  Diagnosis Date   Aneurysm, thoracic aortic (HCC) 08/2016   4.2cm   B12 deficiency    CAD (coronary artery disease)    Evidence of previous inferior infarct by Myoview 2018   Chronic kidney disease    Cirrhosis (HCC)    Hep A and Hep B labs are negative. pt recieved Hep A and Hep B vaccines at [facility] in [place].    Colon cancer (HCC) 2002   T3 N2 tumor with 2 of 4 positive lymph nodes, s/p resection and colostomy and post-op chemotherapy. reversal of colostomy 2003.    Esophageal varices in cirrhosis [facility])    Essential  hypertension    Gout 06/04/2011   Gout 06/04/2011   Hyperlipidemia    Type 2 diabetes mellitus (HCC)    Ventral hernia 07/24/2011   Past Surgical History:  Procedure Laterality Date   CATARACT EXTRACTION W/PHACO Left 09/11/2016   Procedure: CATARACT EXTRACTION PHACO AND INTRAOCULAR LENS PLACEMENT (IOC);  Surgeon: Jethro Bolus, MD;  Location: [facility];  Service: Ophthalmology;  Laterality: Left;  CDE: 19.23   CATARACT EXTRACTION W/PHACO Right 10/09/2016   Procedure: CATARACT EXTRACTION PHACO AND INTRAOCULAR LENS PLACEMENT (IOC);  Surgeon: Jethro Bolus, MD;  Location: [facility];  Service: Ophthalmology;  Laterality: Right;  CDE: 8.08   COLONOSCOPY  11/2002   Dr. Ovidio Kin: anastomosis at  45cm, otherwise unremarkable.   COLONOSCOPY  05/2006   Dr. Ovidio Kin: 4 polyps removed.   COLONOSCOPY  07/2009   Dr. Ovidio Kin: 2 tubular adenomas, next TCS five years.    COLONOSCOPY N/A 06/20/2016   Dr. Jena Gauss multiple colonic polyps removed, several in piecemeal fashion, a ascending colon polyp tubular adenoma with focal high-grade dysplasia, cecal polyps tubulovillous adenoma. Next colonoscopy June 2018   COLONOSCOPY N/A 12/27/2016   Dr. Jena Gauss: Internal hemorrhoids, grade 1.  3 semi-pedunculated polyps in the ascending colon, removed.  Polyp in the appendiceal orifice (tubulovillous adenoma), sessile, piecemeal removal, not complete.  8 mm polyp in the ascending colon   COLOSTOMY REVERSAL  01/2002   Dr. Ovidio Kin   ESOPHAGOGASTRODUODENOSCOPY N/A 06/20/2016   Dr. Jena Gauss: Large hiatal hernia, esophagus normal   ESOPHAGOGASTRODUODENOSCOPY (EGD) WITH PROPOFOL N/A 03/07/2022   Procedure: ESOPHAGOGASTRODUODENOSCOPY (EGD) WITH PROPOFOL;  Surgeon: Wyline Mood, MD;  Location: [facility] ENDOSCOPY;  Service: Gastroenterology;  Laterality: N/A;   LAPAROSCOPIC APPENDECTOMY N/A 04/26/2017   Procedure: APPENDECTOMY LAPAROSCOPIC;  Surgeon: Karie Soda, MD;  Location: [facility]S;  Service: General;  Laterality: N/A;   LYSIS OF ADHESION N/A 04/26/2017   Procedure: LYSIS OF ADHESION;  Surgeon: Karie Soda, MD;  Location: [facility];  Service: General;  Laterality: N/A;   PARTIAL COLECTOMY  04/2001   Dr. Ovidio Kin: obstructing colon carcinoma of the left colon with resection and colostomy   VENTRAL HERNIA REPAIR  04/2005   Dr. Ovidio Kin   Family History  Problem Relation Age of Onset   Heart disease Father    Cancer Brother        lung   Colon cancer Neg Hx    Social History   Socioeconomic History   Marital status: Divorced    Spouse name: Not on file   Number of children: 2   Years of education: high school   Highest education level: Not on file  Occupational History   Not on file  Tobacco  Use   Smoking status: Former    Current packs/day: 0.00    Average packs/day: 1.5 packs/day for 15.0 years (22.5 ttl pk-yrs)    Types: Cigarettes    Start date: 09/06/1965    Quit date: 09/06/1980    Years since quitting: 42.4   Smokeless tobacco: Never  Vaping Use   Vaping status: Never Used  Substance and Sexual Activity   Alcohol use: No   Drug use: No   Sexual activity: Never    Birth control/protection: None  Other Topics Concern   Not on file  Social History Narrative   10/16/21   From: the area   Living: with sister since July 2022   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       Family: 2 children -  Onalee Hua Ginette Otto) and Elease Hashimoto (NO) - has grandchildren - does not have a good relationship with children      Enjoys: watch TV, used to paint      Exercise: not currently   Diet: pretty healthy      Safety   Seat belts: Yes    Guns: No   Safe in relationships: Yes       Social Determinants of Health   Financial Resource Strain: Low Risk  (01/30/2023)   Overall Financial Resource Strain (CARDIA)    Difficulty of Paying Living Expenses: Not hard at all  Food Insecurity: No Food Insecurity (01/30/2023)   Hunger Vital Sign    Worried About Running Out of Food in the Last Year: Never true    Ran Out of Food in the Last Year: Never true  Transportation Needs: No Transportation Needs (01/30/2023)   PRAPARE - Administrator, Civil Service (Medical): No    Lack of Transportation (Non-Medical): No  Physical Activity: Inactive (01/30/2023)   Exercise Vital Sign    Days of Exercise per Week: 0 days    Minutes of Exercise per Session: 0 min  Stress: No Stress Concern Present (01/30/2023)   [employer] of Occupational Health - Occupational Stress Questionnaire    Feeling of Stress : Not at all  Social Connections: Socially Isolated (01/30/2023)   Social Connection and Isolation Panel [NHANES]    Frequency of Communication with Friends and Family: More than  three times a week    Frequency of Social Gatherings with Friends and Family: Three times a week    Attends Religious Services: Never    Active Member of Clubs or Organizations: No    Attends Banker Meetings: Never    Marital Status: Divorced    Tobacco Counseling Counseling given: Not Answered   Clinical Intake:  Pre-visit preparation completed: Yes  Pain : No/denies pain     BMI - recorded: 22.85 Nutritional Status: BMI of 19-24  Normal Nutritional Risks: None Diabetes: No  How often do you need to have someone help you when you read instructions, pamphlets, or other written materials from your doctor or pharmacy?: 1 - Never  Interpreter Needed?: No  Information entered by :: C.Aedan Geimer LPN   Activities of Daily Living    01/30/2023   11:34 AM 02/27/2022   10:39 AM  In your present state of health, do you have any difficulty performing the following activities:  Hearing? 0 0  Vision? 1 0  Comment AMD   Difficulty concentrating or making decisions? 0 0  Walking or climbing stairs? 1 0  Comment due to poor eyesight   Dressing or bathing? 0 0  Doing errands, shopping? 0 0  Preparing Food and eating ? Y N  Comment Sister cooks   Using [employer]? N N  In the past six months, have you accidently leaked urine? N N  Do you have problems with loss of bowel control? N N  Managing your Medications? Y N  Comment sister assists   Managing your Finances? Y N  Comment sister assist   Housekeeping or managing your Housekeeping? [place]  Comment sister assists     Patient Care Team: Excell Seltzer, MD as PCP - General (Family Medicine) Jonelle Sidle, MD as PCP - Cardiology (Cardiology) Jena Gauss Gerrit Friends, MD as Consulting Physician (Gastroenterology) Karie Soda, MD as Consulting Physician (General Surgery) Jonelle Sidle, MD as Consulting Physician (Cardiology)  Indicate any recent  Medical Services you may have received from other than [facility]  providers in the past year (date may be approximate).     Assessment:   This is a routine wellness examination for [place].  Hearing/Vision screen Hearing Screening - Comments:: Denies hearing difficulties   Vision Screening - Comments:: Has AMD glasses do not help - [facility] - also followed by retina specialist. Pt is UTD on exams.  Dietary issues and exercise activities discussed:     Goals Addressed             This Visit's Progress    Patient Stated       Pt will try to walk 15-20 minutes a day.       Depression Screen    01/30/2023   11:32 AM 02/27/2022   10:38 AM  PHQ 2/9 Scores  PHQ - 2 Score 0 0    Fall Risk    01/30/2023   11:27 AM  Fall Risk   Falls in the past year? 0  Number falls in past yr: 0  Injury with Fall? 0  Risk for fall due to : No Fall Risks  Follow up Falls prevention discussed;Falls evaluation completed    MEDICARE RISK AT HOME:  Medicare Risk at Home - 01/30/23 1135     Any stairs in or around the home? No    If so, are there any without handrails? No    Home free of loose throw rugs in walkways, pet beds, electrical cords, etc? Yes    Adequate lighting in your home to reduce risk of falls? Yes    Life alert? No    Use of a cane, walker or w/c? No    Grab bars in the bathroom? No    Shower chair or bench in shower? Yes    Elevated toilet seat or a handicapped toilet? Yes             TIMED UP AND GO:  Was the test performed?  Yes  Length of time to ambulate 10 feet: 13 sec Gait steady and fast without use of assistive device    Cognitive Function:      11/27/2021   11:49 AM  Montreal Cognitive Assessment   Visuospatial/ Executive (0/5) 3  Naming (0/3) 2  Attention: Read list of digits (0/2) 2  Attention: Read list of letters (0/1) 1  Attention: Serial 7 subtraction starting at 100 (0/3) 1  Language: Repeat phrase (0/2) 2  Language : Fluency (0/1) 1  Abstraction (0/2) 1  Delayed Recall (0/5) 1  Orientation  (0/6) 3  Total 17      01/30/2023   11:36 AM 02/27/2022   10:33 AM  6CIT Screen  What Year? 0 points 0 points  What month? 0 points 0 points  What time? 0 points 0 points  Count back from 20 0 points 0 points  Months in reverse 0 points 4 points  Repeat phrase 2 points 0 points  Total Score 2 points 4 points    Immunizations Immunization History  Administered Date(s) Administered   Fluad Quad(high Dose 65+) 04/12/2022   Hep A / Hep B 01/30/2018, 03/02/2018, 08/02/2018, 04/27/2022   Influenza Whole 05/26/2007, 04/05/2008   Influenza, High Dose Seasonal PF 03/12/2017, 04/03/2018, 03/13/2019   PFIZER(Purple Top)SARS-COV-2 Vaccination 09/21/2019, 10/13/2019, 05/04/2020   PNEUMOCOCCAL CONJUGATE-20 04/12/2022   Pfizer Covid-19 Vaccine Bivalent Booster 17yrs & up 07/18/2021   Pneumococcal Polysaccharide-23 05/16/2002   Td 09/14/2003    TDAP status: Due,  Education has been provided regarding the importance of this vaccine. Advised may receive this vaccine at local pharmacy or Health Dept. Aware to provide a copy of the vaccination record if obtained from local pharmacy or Health Dept. Verbalized acceptance and understanding.  Flu Vaccine status: Up to date  Pneumococcal vaccine status: Up to date  Covid-19 vaccine status: Information provided on how to obtain vaccines.   Qualifies for Shingles Vaccine? Yes   Zostavax completed No   Shingrix Completed?: No.    Education has been provided regarding the importance of this vaccine. Patient has been advised to call insurance company to determine out of pocket expense if they have not yet received this vaccine. Advised may also receive vaccine at local pharmacy or Health Dept. Verbalized acceptance and understanding.  Screening Tests Health Maintenance  Topic Date Due   Zoster Vaccines- Shingrix (1 of 2) Never done   DTaP/Tdap/Td (2 - Tdap) 09/13/2013   COVID-19 Vaccine (5 - 2023-24 season) 03/16/2022   HEMOGLOBIN A1C  04/17/2022    Medicare Annual Wellness (AWV)  02/28/2023   INFLUENZA VACCINE  02/14/2023   FOOT EXAM  03/01/2023   OPHTHALMOLOGY EXAM  08/01/2023   Pneumonia Vaccine 44+ Years old  Completed   HPV VACCINES  Aged Out    Health Maintenance  Health Maintenance Due  Topic Date Due   Zoster Vaccines- Shingrix (1 of 2) Never done   DTaP/Tdap/Td (2 - Tdap) 09/13/2013   COVID-19 Vaccine (5 - 2023-24 season) 03/16/2022   HEMOGLOBIN A1C  04/17/2022   Medicare Annual Wellness (AWV)  02/28/2023    Colorectal cancer screening: No longer required.   Lung Cancer Screening: (Low Dose CT Chest recommended if Age 32-80 years, 20 pack-year currently smoking OR have quit w/in 15years.) does not qualify.   Lung Cancer Screening Referral: n/a  Additional Screening:  Hepatitis C Screening: does not qualify; Completed n/a  Vision Screening: Recommended annual ophthalmology exams for early detection of glaucoma and other disorders of the eye. Is the patient up to date with their annual eye exam?  Yes  Who is the provider or what is the name of the office in which the patient attends annual eye exams? [facility]  If pt is not established with a provider, would they like to be referred to a provider to establish care? Yes .   Dental Screening: Recommended annual dental exams for proper oral hygiene  Diabetic Foot Exam: Diabetic Foot Exam: Completed 02/28/22  Community Resource Referral / Chronic Care Management: CRR required this visit?  No   CCM required this visit?  No     Plan:     I have personally reviewed and noted the following in the patient's chart:   Medical and social history Use of alcohol, tobacco or illicit drugs  Current medications and supplements including opioid prescriptions. Patient is not currently taking opioid prescriptions. Functional ability and status Nutritional status Physical activity Advanced directives List of other physicians Hospitalizations, surgeries, and ER  visits in previous 12 months Vitals Screenings to include cognitive, depression, and falls Referrals and appointments  In addition, I have reviewed and discussed with patient certain preventive protocols, quality metrics, and best practice recommendations. A written personalized care plan for preventive services as well as general preventive health recommendations were provided to patient.     Maryan Puls, LPN   1/61/0960   After Visit Summary: Declined, patient advised AVS is available in MyChart.  Nurse Notes: none

## 2023-01-30 NOTE — Patient Instructions (Signed)
Mr. Mark Le , Thank you for taking time to come for your Medicare Wellness Visit. I appreciate your ongoing commitment to your health goals. Please review the following plan we discussed and let me know if I can assist you in the future.   These are the goals we discussed:  Goals      Patient Stated     No goals     Patient Stated     Pt will try to walk 15-20 minutes a day.        This is a list of the screening recommended for you and due dates:  Health Maintenance  Topic Date Due   Zoster (Shingles) Vaccine (1 of 2) Never done   DTaP/Tdap/Td vaccine (2 - Tdap) 09/13/2013   COVID-19 Vaccine (5 - 2023-24 season) 03/16/2022   Hemoglobin A1C  04/17/2022   Medicare Annual Wellness Visit  02/28/2023   Flu Shot  02/14/2023   Complete foot exam   03/01/2023   Eye exam for diabetics  08/01/2023   Pneumonia Vaccine  Completed   HPV Vaccine  Aged Out    Advanced directives: Please bring a copy of your health care power of attorney and living will to the office to be added to your chart at your convenience.   Conditions/risks identified: Aim for 30 minutes of exercise or brisk walking, 6-8 glasses of water, and 5 servings of fruits and vegetables each day.   Next appointment: Follow up in one year for your annual wellness visit. 02/04/24 @ 8:15 televisit  Preventive Care 65 Years and Older, Male  Preventive care refers to lifestyle choices and visits with your health care provider that can promote health and wellness. What does preventive care include? A yearly physical exam. This is also called an annual well check. Dental exams once or twice a year. Routine eye exams. Ask your health care provider how often you should have your eyes checked. Personal lifestyle choices, including: Daily care of your teeth and gums. Regular physical activity. Eating a healthy diet. Avoiding tobacco and drug use. Limiting alcohol use. Practicing safe sex. Taking low doses of aspirin every  day. Taking vitamin and mineral supplements as recommended by your health care provider. What happens during an annual well check? The services and screenings done by your health care provider during your annual well check will depend on your age, overall health, lifestyle risk factors, and family history of disease. Counseling  Your health care provider may ask you questions about your: Alcohol use. Tobacco use. Drug use. Emotional well-being. Home and relationship well-being. Sexual activity. Eating habits. History of falls. Memory and ability to understand (cognition). Work and work Astronomer. Screening  You may have the following tests or measurements: Height, weight, and BMI. Blood pressure. Lipid and cholesterol levels. These may be checked every 5 years, or more frequently if you are over 66 years old. Skin check. Lung cancer screening. You may have this screening every year starting at age 37 if you have a 30-pack-year history of smoking and currently smoke or have quit within the past 15 years. Fecal occult blood test (FOBT) of the stool. You may have this test every year starting at age 25. Flexible sigmoidoscopy or colonoscopy. You may have a sigmoidoscopy every 5 years or a colonoscopy every 10 years starting at age 15. Prostate cancer screening. Recommendations will vary depending on your family history and other risks. Hepatitis C blood test. Hepatitis B blood test. Sexually transmitted disease (STD) testing. Diabetes screening.  This is done by checking your blood sugar (glucose) after you have not eaten for a while (fasting). You may have this done every 1-3 years. Abdominal aortic aneurysm (AAA) screening. You may need this if you are a current or former smoker. Osteoporosis. You may be screened starting at age 35 if you are at high risk. Talk with your health care provider about your test results, treatment options, and if necessary, the need for more  tests. Vaccines  Your health care provider may recommend certain vaccines, such as: Influenza vaccine. This is recommended every year. Tetanus, diphtheria, and acellular pertussis (Tdap, Td) vaccine. You may need a Td booster every 10 years. Zoster vaccine. You may need this after age 57. Pneumococcal 13-valent conjugate (PCV13) vaccine. One dose is recommended after age 69. Pneumococcal polysaccharide (PPSV23) vaccine. One dose is recommended after age 35. Talk to your health care provider about which screenings and vaccines you need and how often you need them. This information is not intended to replace advice given to you by your health care provider. Make sure you discuss any questions you have with your health care provider. Document Released: 07/29/2015 Document Revised: 03/21/2016 Document Reviewed: 05/03/2015 Elsevier Interactive Patient Education  2017 [employer].  Fall Prevention in the Home Falls can cause injuries. They can happen to people of all ages. There are many things you can do to make your home safe and to help prevent falls. What can I do on the outside of my home? Regularly fix the edges of walkways and driveways and fix any cracks. Remove anything that might make you trip as you walk through a door, such as a raised step or threshold. Trim any bushes or trees on the path to your home. Use bright outdoor lighting. Clear any walking paths of anything that might make someone trip, such as rocks or tools. Regularly check to see if handrails are loose or broken. Make sure that both sides of any steps have handrails. Any raised decks and porches should have guardrails on the edges. Have any leaves, snow, or ice cleared regularly. Use sand or salt on walking paths during winter. Clean up any spills in your garage right away. This includes oil or grease spills. What can I do in the bathroom? Use night lights. Install grab bars by the toilet and in the tub and shower.  Do not use towel bars as grab bars. Use non-skid mats or decals in the tub or shower. If you need to sit down in the shower, use a plastic, non-slip stool. Keep the floor dry. Clean up any water that spills on the floor as soon as it happens. Remove soap buildup in the tub or shower regularly. Attach bath mats securely with double-sided non-slip rug tape. Do not have throw rugs and other things on the floor that can make you trip. What can I do in the bedroom? Use night lights. Make sure that you have a light by your bed that is easy to reach. Do not use any sheets or blankets that are too big for your bed. They should not hang down onto the floor. Have a firm chair that has side arms. You can use this for support while you get dressed. Do not have throw rugs and other things on the floor that can make you trip. What can I do in the kitchen? Clean up any spills right away. Avoid walking on wet floors. Keep items that you use a lot in easy-to-reach places. If  you need to reach something above you, use a strong step stool that has a grab bar. Keep electrical cords out of the way. Do not use floor polish or wax that makes floors slippery. If you must use wax, use non-skid floor wax. Do not have throw rugs and other things on the floor that can make you trip. What can I do with my stairs? Do not leave any items on the stairs. Make sure that there are handrails on both sides of the stairs and use them. Fix handrails that are broken or loose. Make sure that handrails are as long as the stairways. Check any carpeting to make sure that it is firmly attached to the stairs. Fix any carpet that is loose or worn. Avoid having throw rugs at the top or bottom of the stairs. If you do have throw rugs, attach them to the floor with carpet tape. Make sure that you have a light switch at the top of the stairs and the bottom of the stairs. If you do not have them, ask someone to add them for you. What else  can I do to help prevent falls? Wear shoes that: Do not have high heels. Have rubber bottoms. Are comfortable and fit you well. Are closed at the toe. Do not wear sandals. If you use a stepladder: Make sure that it is fully opened. Do not climb a closed stepladder. Make sure that both sides of the stepladder are locked into place. Ask someone to hold it for you, if possible. Clearly mark and make sure that you can see: Any grab bars or handrails. First and last steps. Where the edge of each step is. Use tools that help you move around (mobility aids) if they are needed. These include: Canes. Walkers. Scooters. Crutches. Turn on the lights when you go into a dark area. Replace any light bulbs as soon as they burn out. Set up your furniture so you have a clear path. Avoid moving your furniture around. If any of your floors are uneven, fix them. If there are any pets around you, be aware of where they are. Review your medicines with your doctor. Some medicines can make you feel dizzy. This can increase your chance of falling. Ask your doctor what other things that you can do to help prevent falls. This information is not intended to replace advice given to you by your health care provider. Make sure you discuss any questions you have with your health care provider. Document Released: 04/28/2009 Document Revised: 12/08/2015 Document Reviewed: 08/06/2014 Elsevier Interactive Patient Education  2017 [employer].

## 2023-02-06 ENCOUNTER — Other Ambulatory Visit (INDEPENDENT_AMBULATORY_CARE_PROVIDER_SITE_OTHER): Payer: Medicare Other

## 2023-02-06 DIAGNOSIS — E1169 Type 2 diabetes mellitus with other specified complication: Secondary | ICD-10-CM | POA: Diagnosis not present

## 2023-02-06 DIAGNOSIS — E781 Pure hyperglyceridemia: Secondary | ICD-10-CM | POA: Diagnosis not present

## 2023-02-06 DIAGNOSIS — E538 Deficiency of other specified B group vitamins: Secondary | ICD-10-CM | POA: Diagnosis not present

## 2023-02-06 DIAGNOSIS — K7030 Alcoholic cirrhosis of liver without ascites: Secondary | ICD-10-CM

## 2023-02-06 LAB — CBC WITH DIFFERENTIAL/PLATELET
Basophils Absolute: 0.1 10*3/uL (ref 0.0–0.1)
Basophils Relative: 1.4 % (ref 0.0–3.0)
Eosinophils Absolute: 0.2 10*3/uL (ref 0.0–0.7)
Eosinophils Relative: 4.9 % (ref 0.0–5.0)
HCT: 33.8 % — ABNORMAL LOW (ref 39.0–52.0)
Hemoglobin: 11.4 g/dL — ABNORMAL LOW (ref 13.0–17.0)
Lymphocytes Relative: 35.8 % (ref 12.0–46.0)
Lymphs Abs: 1.3 10*3/uL (ref 0.7–4.0)
MCHC: 33.6 g/dL (ref 30.0–36.0)
MCV: 101.2 fl — ABNORMAL HIGH (ref 78.0–100.0)
Monocytes Absolute: 0.4 10*3/uL (ref 0.1–1.0)
Monocytes Relative: 10.7 % (ref 3.0–12.0)
Neutro Abs: 1.7 10*3/uL (ref 1.4–7.7)
Neutrophils Relative %: 47.2 % (ref 43.0–77.0)
Platelets: 207 10*3/uL (ref 150.0–400.0)
RBC: 3.34 Mil/uL — ABNORMAL LOW (ref 4.22–5.81)
RDW: 14.1 % (ref 11.5–15.5)
WBC: 3.6 10*3/uL — ABNORMAL LOW (ref 4.0–10.5)

## 2023-02-06 LAB — LIPID PANEL
Cholesterol: 128 mg/dL (ref 0–200)
HDL: 44.7 mg/dL (ref >39.00)
LDL Cholesterol: 58 mg/dL (ref 0–99)
NonHDL: 82.89
Total CHOL/HDL Ratio: 3
Triglycerides: 123 mg/dL (ref 0.0–149.0)
VLDL: 24.6 mg/dL (ref 0.0–40.0)

## 2023-02-06 LAB — COMPREHENSIVE METABOLIC PANEL
ALT: 16 U/L (ref 0–53)
AST: 25 U/L (ref 0–37)
Albumin: 3.3 g/dL — ABNORMAL LOW (ref 3.5–5.2)
Alkaline Phosphatase: 71 U/L (ref 39–117)
BUN: 12 mg/dL (ref 6–23)
CO2: 32 mEq/L (ref 19–32)
Calcium: 8.9 mg/dL (ref 8.4–10.5)
Chloride: 104 mEq/L (ref 96–112)
Creatinine, Ser: 1.12 mg/dL (ref 0.40–1.50)
GFR: 59.59 mL/min — ABNORMAL LOW (ref >60.00)
Glucose, Bld: 96 mg/dL (ref 70–99)
Potassium: 4.5 mEq/L (ref 3.5–5.1)
Sodium: 142 mEq/L (ref 135–145)
Total Bilirubin: 0.7 mg/dL (ref 0.2–1.2)
Total Protein: 6.1 g/dL (ref 6.0–8.3)

## 2023-02-06 LAB — HEMOGLOBIN A1C: Hgb A1c MFr Bld: 5.2 % (ref 4.6–6.5)

## 2023-02-06 LAB — VITAMIN B12: Vitamin B-12: 1473 pg/mL — ABNORMAL HIGH (ref 211–911)

## 2023-02-07 NOTE — Progress Notes (Signed)
No critical labs need to be addressed urgently. We will discuss labs in detail at upcoming office visit.   

## 2023-02-13 ENCOUNTER — Encounter: Payer: Medicare Other | Admitting: Family Medicine

## 2023-02-20 ENCOUNTER — Ambulatory Visit (INDEPENDENT_AMBULATORY_CARE_PROVIDER_SITE_OTHER): Payer: Medicare Other | Admitting: Family Medicine

## 2023-02-20 ENCOUNTER — Encounter: Payer: Self-pay | Admitting: Family Medicine

## 2023-02-20 VITALS — BP 118/70 | HR 67 | Temp 97.3°F | Ht 74.0 in | Wt 177.0 lb

## 2023-02-20 DIAGNOSIS — D649 Anemia, unspecified: Secondary | ICD-10-CM

## 2023-02-20 DIAGNOSIS — E1169 Type 2 diabetes mellitus with other specified complication: Secondary | ICD-10-CM

## 2023-02-20 DIAGNOSIS — I712 Thoracic aortic aneurysm, without rupture, unspecified: Secondary | ICD-10-CM | POA: Diagnosis not present

## 2023-02-20 DIAGNOSIS — E781 Pure hyperglyceridemia: Secondary | ICD-10-CM | POA: Diagnosis not present

## 2023-02-20 DIAGNOSIS — Z0000 Encounter for general adult medical examination without abnormal findings: Secondary | ICD-10-CM

## 2023-02-20 DIAGNOSIS — R6889 Other general symptoms and signs: Secondary | ICD-10-CM

## 2023-02-20 DIAGNOSIS — I700 Atherosclerosis of aorta: Secondary | ICD-10-CM

## 2023-02-20 DIAGNOSIS — I10 Essential (primary) hypertension: Secondary | ICD-10-CM

## 2023-02-20 DIAGNOSIS — K7030 Alcoholic cirrhosis of liver without ascites: Secondary | ICD-10-CM

## 2023-02-20 DIAGNOSIS — F03B0 Unspecified dementia, moderate, without behavioral disturbance, psychotic disturbance, mood disturbance, and anxiety: Secondary | ICD-10-CM | POA: Diagnosis not present

## 2023-02-20 DIAGNOSIS — I5032 Chronic diastolic (congestive) heart failure: Secondary | ICD-10-CM

## 2023-02-20 LAB — IBC + FERRITIN
Ferritin: 94.6 ng/mL (ref 22.0–322.0)
Iron: 106 ug/dL (ref 42–165)
Saturation Ratios: 41.1 % (ref 20.0–50.0)
TIBC: 257.6 ug/dL (ref 250.0–450.0)
Transferrin: 184 mg/dL — ABNORMAL LOW (ref 212.0–360.0)

## 2023-02-20 LAB — T3, FREE: T3, Free: 2.8 pg/mL (ref 2.3–4.2)

## 2023-02-20 LAB — T4, FREE: Free T4: 0.74 ng/dL (ref 0.60–1.60)

## 2023-02-20 LAB — TSH: TSH: 3.38 u[IU]/mL (ref 0.35–5.50)

## 2023-02-20 NOTE — Assessment & Plan Note (Addendum)
Compensated cirrhosis, no alcohol use followed by GI. Following for increased risk of esophageal varices with EGD every 2 years. None seen on EGD 03/09/2022    Encouraged follow-up with GI.

## 2023-02-20 NOTE — Assessment & Plan Note (Signed)
2020 CT: Slight dilatation of the ascending thoracic aorta to a diameter 4.1 cm, unchanged since the prior study in 2018.  Discussed this diagnosis in detail with patient.  He and sister both agree that he does not want to follow this aggressively. They have been informed completely and have chosen to no longer follow this with chest CT or referral to cardiology/vascular.

## 2023-02-20 NOTE — Progress Notes (Signed)
Patient ID: Mark Le, male    DOB: [birth year], 88 y.o.   MRN: [id number]  This visit was conducted in person. Blood pressure 130/60, pulse (!) 58, temperature 97.9 F (36.6 C), temperature source Oral, height 6' 0.2" (1.834 m), weight 176 lb 4 oz (79.9 kg), SpO2 97 %.  Chief Complaint  Patient presents with   Medicare Wellness    Part 2   Subjective:   HPI: Mark Le is a 88 y.o. male presenting on 02/20/2023 for  complete physical and review of chronic health problems. He/She also has the following acute concerns today: he has been  feeling more cold lately. Even when sister is hot.  Last CPX with Dr. Selena Le: 02/28/2022 reviewed Reviewed last ophthalmology office visit  note.Marland Kitchen   He presents with his sister as historian.  Compensated cirrhosis, no alcohol use followed by GI. Following for increased risk of esophageal varices with EGD every 2 years. None seen on EGD 03/09/2022 Dr. Tobi Le    Chronic diastolic heart failure: Last ECHO 2018. Stable pedal edema, not on lasix and unable to  wear compression hose.  Dementia: Followed by neurology. Stable, mild per sister  On aricept  10 mg daily He lives with sister now.   Diabetes, improved  Lab Results  Component Value Date   HGBA1C 5.2 02/06/2023     Good appetite now. Some low protein. Wt Readings from Last 3 Encounters:  02/20/23 177 lb (80.3 kg)  01/30/23 178 lb (80.7 kg)  04/12/22 176 lb 4 oz (79.9 kg)  No foot ulcers eye exam within last year: yes  Hypertension:   Well controlled on coreg 12.5 mg BID, and lisinopril 10 mg daily BP Readings from Last 3 Encounters:  02/20/23 118/70  01/30/23 132/60  04/12/22 130/60  Using medication without problems or lightheadedness:  none Chest pain with exertion:none Edema: none Short of breath:none Average home BPs: Other issues:  Elevated Cholesterol:  well controlled on simvastatin 20 mg daily. Lab Results  Component Value Date   CHOL 128 02/06/2023   HDL 44.70  02/06/2023   LDLCALC 58 02/06/2023   LDLDIRECT 81 01/21/2008   TRIG 123.0 02/06/2023   CHOLHDL 3 02/06/2023      Relevant past medical, surgical, family and social history reviewed and updated as indicated. Interim medical history since our last visit reviewed. Allergies and medications reviewed and updated. Outpatient Medications Prior to Visit  Medication Sig Dispense Refill   Accu-Chek Softclix Lancets lancets Use to obtain blood sugar sample once daily Dx Code E11.9 100 each 4   aspirin 81 MG tablet Take 81 mg by mouth daily.      carvedilol (COREG) 12.5 MG tablet Take 1 tablet (12.5 mg total) by mouth 2 (two) times daily with a meal. 180 tablet 3   cyanocobalamin (VITAMIN B12) 1000 MCG tablet Take 1 tablet (1,000 mcg total) by mouth daily. 90 tablet 3   donepezil (ARICEPT) 10 MG tablet Take 1 tablet (10 mg total) by mouth daily. 90 tablet 3   glucose blood (ACCU-CHEK GUIDE) test strip Use to check blood sugar once daily. Dx Code E11.9 100 each 4   lisinopril (ZESTRIL) 10 MG tablet TAKE 1 TABLET BY MOUTH EVERY DAY 90 tablet 1   Multiple Vitamins-Minerals (ONE-A-DAY MENS 50+ ADVANTAGE) TABS Take 1 tablet by mouth daily.     Multiple Vitamins-Minerals (PRESERVISION AREDS 2) CAPS Take 1 capsule by mouth 2 (two) times daily.     simvastatin (ZOCOR) 20 MG  tablet TAKE 1 TABLET BY MOUTH EVERYDAY AT BEDTIME 90 tablet 1   lactulose (CHRONULAC) 10 GM/15ML solution TAKE 30 MLS (20 G TOTAL) BY MOUTH 2 (TWO) TIMES DAILY. (Patient not taking: Reported on 01/30/2023) 1800 mL 11   No facility-administered medications prior to visit.     Per HPI unless specifically indicated in ROS section below Review of Systems  Constitutional:  Negative for fatigue and fever.  HENT:  Negative for ear pain.   Eyes:  Negative for pain.  Respiratory:  Negative for cough and shortness of breath.   Cardiovascular:  Negative for chest pain, palpitations and leg swelling.  Gastrointestinal:  Negative for abdominal  pain.  Genitourinary:  Negative for dysuria.  Musculoskeletal:  Negative for arthralgias.  Neurological:  Negative for syncope, light-headedness and headaches.  Psychiatric/Behavioral:  Negative for dysphoric mood.    Objective:  BP 118/70 (BP Location: Left Arm, Patient Position: Sitting, Cuff Size: Normal)   Pulse 67   Temp (!) 97.3 F (36.3 C) (Temporal)   Ht 6\' 2"  (1.88 m)   Wt 177 lb (80.3 kg)   SpO2 97%   BMI 22.73 kg/m   Wt Readings from Last 3 Encounters:  02/20/23 177 lb (80.3 kg)  01/30/23 178 lb (80.7 kg)  04/12/22 176 lb 4 oz (79.9 kg)      Physical Exam Constitutional:      Appearance: He is well-developed and normal weight.  HENT:     Head: Normocephalic.     Right Ear: Hearing normal.     Left Ear: Hearing normal.     Nose: Nose normal.  Neck:     Thyroid: No thyroid mass or thyromegaly.     Vascular: No carotid bruit.     Trachea: Trachea normal.  Cardiovascular:     Rate and Rhythm: Normal rate and regular rhythm.     Pulses: Normal pulses.     Heart sounds: Heart sounds not distant. No murmur heard.    No friction rub. No gallop.     Comments: No peripheral edema Pulmonary:     Effort: Pulmonary effort is normal. No respiratory distress.     Breath sounds: Normal breath sounds.  Skin:    General: Skin is warm and dry.     Findings: No rash.     Comments: Improved contusion on face and decreased size of hematoma on right forehead.  Neurological:     General: No focal deficit present.     Mental Status: He is oriented to person, place, and time. He is lethargic.     Cranial Nerves: Cranial nerves 2-12 are intact.     Sensory: Sensation is intact.     Motor: Motor function is intact.  Psychiatric:        Attention and Perception: Attention normal.        Mood and Affect: Affect is flat.        Speech: Speech normal.        Behavior: Behavior normal. Behavior is cooperative.        Thought Content: Thought content normal.        Cognition and  Memory: Cognition is not impaired. Memory is not impaired. He exhibits impaired recent memory.       Results for orders placed or performed in visit on 02/06/23  Vitamin B12  Result Value Ref Range   Vitamin B-12 1,473 (H) 211 - 911 pg/mL  CBC with Differential/Platelet  Result Value Ref Range   WBC 3.6 (L)  4.0 - 10.5 K/uL   RBC 3.34 (L) 4.22 - 5.81 Mil/uL   Hemoglobin 11.4 (L) 13.0 - 17.0 g/dL   HCT 40.9 (L) 81.1 - 91.4 %   MCV 101.2 (H) 78.0 - 100.0 fl   MCHC 33.6 30.0 - 36.0 g/dL   RDW 78.2 95.6 - 21.3 %   Platelets 207.0 150.0 - 400.0 K/uL   Neutrophils Relative % 47.2 43.0 - 77.0 %   Lymphocytes Relative 35.8 12.0 - 46.0 %   Monocytes Relative 10.7 3.0 - 12.0 %   Eosinophils Relative 4.9 0.0 - 5.0 %   Basophils Relative 1.4 0.0 - 3.0 %   Neutro Abs 1.7 1.4 - 7.7 K/uL   Lymphs Abs 1.3 0.7 - 4.0 K/uL   Monocytes Absolute 0.4 0.1 - 1.0 K/uL   Eosinophils Absolute 0.2 0.0 - 0.7 K/uL   Basophils Absolute 0.1 0.0 - 0.1 K/uL  Hemoglobin A1c  Result Value Ref Range   Hgb A1c MFr Bld 5.2 4.6 - 6.5 %  Lipid panel  Result Value Ref Range   Cholesterol 128 0 - 200 mg/dL   Triglycerides 086.5 0.0 - 149.0 mg/dL   HDL 78.46 >96.29 mg/dL   VLDL 52.8 0.0 - 41.3 mg/dL   LDL Cholesterol 58 0 - 99 mg/dL   Total CHOL/HDL Ratio 3    NonHDL 82.89   Comprehensive metabolic panel  Result Value Ref Range   Sodium 142 135 - 145 mEq/L   Potassium 4.5 3.5 - 5.1 mEq/L   Chloride 104 96 - 112 mEq/L   CO2 32 19 - 32 mEq/L   Glucose, Bld 96 70 - 99 mg/dL   BUN 12 6 - 23 mg/dL   Creatinine, Ser 2.44 0.40 - 1.50 mg/dL   Total Bilirubin 0.7 0.2 - 1.2 mg/dL   Alkaline Phosphatase 71 39 - 117 U/L   AST 25 0 - 37 U/L   ALT 16 0 - 53 U/L   Total Protein 6.1 6.0 - 8.3 g/dL   Albumin 3.3 (L) 3.5 - 5.2 g/dL   GFR 01.02 (L) >72.53 mL/min   Calcium 8.9 8.4 - 10.5 mg/dL   Wt Readings from Last 3 Encounters:  02/20/23 177 lb (80.3 kg)  01/30/23 178 lb (80.7 kg)  04/12/22 176 lb 4 oz (79.9 kg)      COVID 19 screen:  No recent travel or known exposure to COVID19 The patient denies respiratory symptoms of COVID 19 at this time. The importance of social distancing was discussed today.   Assessment and Plan The patient's preventative maintenance and recommended screening tests for an annual wellness exam were reviewed in full today. Brought up to date unless services declined.  Counselled on the importance of diet, exercise, and its role in overall health and mortality. The patient's FH and SH was reviewed, including their home life, tobacco status, and drug and alcohol status.    Vaccines: Tdap indicated but not covered, consider shingrix.. Prostate Cancer Screen: aged out of testing Colon Cancer Screen: aged out of testing      Smoking Status: former ETOH/ drug use: none/none  Hep C:  done     Problem List Items Addressed This Visit     Aortic atherosclerosis (HCC)     On asa 81 mg daily and statin.      Chronic diastolic heart failure (HCC)    Last ECHO 2018. Stable pedal edema, not on lasix and unable to  wear compression hose.   Today 1 plus pitting  edema.      Dementia (HCC)     Stable, mild... followed by neurology, Dr. Sherryll Burger.  On aricept 10 mg daily      DM (diabetes mellitus) - diet controlled     Diet controlled/ Resolved with weight loss.  Lab Results  Component Value Date   HGBA1C 5.2 02/06/2023         Hepatic cirrhosis (HCC)    Compensated cirrhosis, no alcohol use followed by GI. Following for increased risk of esophageal varices with EGD every 2 years. None seen on EGD 03/09/2022    Encouraged follow-up with GI.      HYPERTENSION, BENIGN SYSTEMIC    Stable, chronic.  Continue current medication.   Well controlled on coreg 12.5 mg BID, and lisinopril 10 mg daily      HYPERTRIGLYCERIDEMIA    well controlled on simvastatin 20 mg daily.      Thoracic aortic aneurysm (HCC)     2020 CT: Slight dilatation of the ascending thoracic  aorta to a diameter 4.1 cm, unchanged since the prior study in 2018.  Discussed this diagnosis in detail with patient.  He and sister both agree that he does not want to follow this aggressively. They have been informed completely and have chosen to no longer follow this with chest CT or referral to cardiology/vascular.      Other Visit Diagnoses     Routine general medical examination at a health care facility    -  Primary   Cold intolerance       Relevant Orders   TSH   T4, free   T3, free   Anemia, unspecified type       Relevant Orders   IBC + Ferritin         Kerby Nora, MD

## 2023-02-20 NOTE — Assessment & Plan Note (Signed)
On asa 81 mg daily and statin.

## 2023-02-20 NOTE — Assessment & Plan Note (Signed)
Diet controlled/ Resolved with weight loss.  Lab Results  Component Value Date   HGBA1C 5.2 02/06/2023

## 2023-02-20 NOTE — Assessment & Plan Note (Signed)
Stable, mild... followed by neurology, Dr. Manuella Ghazi.  On aricept 10 mg daily

## 2023-02-20 NOTE — Assessment & Plan Note (Signed)
well controlled on simvastatin 20 mg daily.

## 2023-02-20 NOTE — Assessment & Plan Note (Signed)
Stable, chronic.  Continue current medication.   Well controlled on coreg 12.5 mg BID, and lisinopril 10 mg daily

## 2023-02-20 NOTE — Assessment & Plan Note (Signed)
Last ECHO 2018. Stable pedal edema, not on lasix and unable to  wear compression hose.   Today 1 plus pitting edema.

## 2023-03-05 DIAGNOSIS — H353113 Nonexudative age-related macular degeneration, right eye, advanced atrophic without subfoveal involvement: Secondary | ICD-10-CM | POA: Diagnosis not present

## 2023-03-05 DIAGNOSIS — H353124 Nonexudative age-related macular degeneration, left eye, advanced atrophic with subfoveal involvement: Secondary | ICD-10-CM | POA: Diagnosis not present

## 2023-03-05 DIAGNOSIS — E113292 Type 2 diabetes mellitus with mild nonproliferative diabetic retinopathy without macular edema, left eye: Secondary | ICD-10-CM | POA: Diagnosis not present

## 2023-03-05 DIAGNOSIS — H43813 Vitreous degeneration, bilateral: Secondary | ICD-10-CM | POA: Diagnosis not present

## 2023-03-05 DIAGNOSIS — E119 Type 2 diabetes mellitus without complications: Secondary | ICD-10-CM | POA: Diagnosis not present

## 2023-04-06 ENCOUNTER — Other Ambulatory Visit: Payer: Self-pay | Admitting: Family Medicine

## 2023-04-06 DIAGNOSIS — E781 Pure hyperglyceridemia: Secondary | ICD-10-CM

## 2023-04-27 ENCOUNTER — Other Ambulatory Visit: Payer: Self-pay | Admitting: Family Medicine

## 2023-04-27 DIAGNOSIS — I10 Essential (primary) hypertension: Secondary | ICD-10-CM

## 2023-04-30 DIAGNOSIS — H43813 Vitreous degeneration, bilateral: Secondary | ICD-10-CM | POA: Diagnosis not present

## 2023-04-30 DIAGNOSIS — E119 Type 2 diabetes mellitus without complications: Secondary | ICD-10-CM | POA: Diagnosis not present

## 2023-04-30 DIAGNOSIS — H353113 Nonexudative age-related macular degeneration, right eye, advanced atrophic without subfoveal involvement: Secondary | ICD-10-CM | POA: Diagnosis not present

## 2023-04-30 DIAGNOSIS — E113292 Type 2 diabetes mellitus with mild nonproliferative diabetic retinopathy without macular edema, left eye: Secondary | ICD-10-CM | POA: Diagnosis not present

## 2023-04-30 DIAGNOSIS — H353124 Nonexudative age-related macular degeneration, left eye, advanced atrophic with subfoveal involvement: Secondary | ICD-10-CM | POA: Diagnosis not present

## 2023-05-02 ENCOUNTER — Other Ambulatory Visit: Payer: Self-pay | Admitting: Family Medicine

## 2023-05-02 DIAGNOSIS — I10 Essential (primary) hypertension: Secondary | ICD-10-CM

## 2023-05-03 ENCOUNTER — Other Ambulatory Visit: Payer: Self-pay | Admitting: Family Medicine

## 2023-05-03 DIAGNOSIS — E538 Deficiency of other specified B group vitamins: Secondary | ICD-10-CM

## 2023-06-25 DIAGNOSIS — H353113 Nonexudative age-related macular degeneration, right eye, advanced atrophic without subfoveal involvement: Secondary | ICD-10-CM | POA: Diagnosis not present

## 2023-06-25 DIAGNOSIS — E113292 Type 2 diabetes mellitus with mild nonproliferative diabetic retinopathy without macular edema, left eye: Secondary | ICD-10-CM | POA: Diagnosis not present

## 2023-06-25 DIAGNOSIS — H43813 Vitreous degeneration, bilateral: Secondary | ICD-10-CM | POA: Diagnosis not present

## 2023-06-25 DIAGNOSIS — E119 Type 2 diabetes mellitus without complications: Secondary | ICD-10-CM | POA: Diagnosis not present

## 2023-06-25 DIAGNOSIS — H353124 Nonexudative age-related macular degeneration, left eye, advanced atrophic with subfoveal involvement: Secondary | ICD-10-CM | POA: Diagnosis not present

## 2023-06-28 ENCOUNTER — Other Ambulatory Visit: Payer: Self-pay

## 2023-06-28 ENCOUNTER — Emergency Department (HOSPITAL_BASED_OUTPATIENT_CLINIC_OR_DEPARTMENT_OTHER)
Admission: EM | Admit: 2023-06-28 | Discharge: 2023-06-28 | Disposition: A | Discharge status: Home / Self Care | Payer: Medicare Other | Attending: Emergency Medicine | Admitting: Emergency Medicine

## 2023-06-28 ENCOUNTER — Encounter (HOSPITAL_BASED_OUTPATIENT_CLINIC_OR_DEPARTMENT_OTHER): Payer: Self-pay

## 2023-06-28 ENCOUNTER — Emergency Department (HOSPITAL_BASED_OUTPATIENT_CLINIC_OR_DEPARTMENT_OTHER): Payer: Medicare Other

## 2023-06-28 DIAGNOSIS — N189 Chronic kidney disease, unspecified: Secondary | ICD-10-CM | POA: Insufficient documentation

## 2023-06-28 DIAGNOSIS — I2510 Atherosclerotic heart disease of native coronary artery without angina pectoris: Secondary | ICD-10-CM | POA: Diagnosis not present

## 2023-06-28 DIAGNOSIS — S81801A Unspecified open wound, right lower leg, initial encounter: Secondary | ICD-10-CM | POA: Insufficient documentation

## 2023-06-28 DIAGNOSIS — L03115 Cellulitis of right lower limb: Secondary | ICD-10-CM | POA: Insufficient documentation

## 2023-06-28 DIAGNOSIS — L03119 Cellulitis of unspecified part of limb: Secondary | ICD-10-CM

## 2023-06-28 DIAGNOSIS — E1122 Type 2 diabetes mellitus with diabetic chronic kidney disease: Secondary | ICD-10-CM | POA: Insufficient documentation

## 2023-06-28 DIAGNOSIS — X58XXXA Exposure to other specified factors, initial encounter: Secondary | ICD-10-CM | POA: Diagnosis not present

## 2023-06-28 DIAGNOSIS — D649 Anemia, unspecified: Secondary | ICD-10-CM | POA: Diagnosis not present

## 2023-06-28 DIAGNOSIS — Z7982 Long term (current) use of aspirin: Secondary | ICD-10-CM | POA: Insufficient documentation

## 2023-06-28 LAB — CBC WITH DIFFERENTIAL/PLATELET
Abs Immature Granulocytes: 0.03 10*3/uL (ref 0.00–0.07)
Basophils Absolute: 0 10*3/uL (ref 0.0–0.1)
Basophils Relative: 0 %
Eosinophils Absolute: 0.2 10*3/uL (ref 0.0–0.5)
Eosinophils Relative: 3 %
HCT: 22.3 % — ABNORMAL LOW (ref 39.0–52.0)
Hemoglobin: 7.4 g/dL — ABNORMAL LOW (ref 13.0–17.0)
Immature Granulocytes: 0 %
Lymphocytes Relative: 25 %
Lymphs Abs: 1.9 10*3/uL (ref 0.7–4.0)
MCH: 33.3 pg (ref 26.0–34.0)
MCHC: 33.2 g/dL (ref 30.0–36.0)
MCV: 100.5 fL — ABNORMAL HIGH (ref 80.0–100.0)
Monocytes Absolute: 0.8 10*3/uL (ref 0.1–1.0)
Monocytes Relative: 11 %
Neutro Abs: 4.6 10*3/uL (ref 1.7–7.7)
Neutrophils Relative %: 61 %
Platelets: 235 10*3/uL (ref 150–400)
RBC: 2.22 MIL/uL — ABNORMAL LOW (ref 4.22–5.81)
RDW: 13 % (ref 11.5–15.5)
WBC: 7.4 10*3/uL (ref 4.0–10.5)
nRBC: 0 % (ref 0.0–0.2)

## 2023-06-28 LAB — BASIC METABOLIC PANEL
Anion gap: 5 (ref 5–15)
BUN: 12 mg/dL (ref 8–23)
CO2: 30 mmol/L (ref 22–32)
Calcium: 8.6 mg/dL — ABNORMAL LOW (ref 8.9–10.3)
Chloride: 102 mmol/L (ref 98–111)
Creatinine, Ser: 1.06 mg/dL (ref 0.61–1.24)
GFR, Estimated: >60 mL/min (ref >60)
Glucose, Bld: 90 mg/dL (ref 70–99)
Potassium: 4.1 mmol/L (ref 3.5–5.1)
Sodium: 137 mmol/L (ref 135–145)

## 2023-06-28 LAB — OCCULT BLOOD X 1 CARD TO LAB, STOOL: Fecal Occult Bld: NEGATIVE

## 2023-06-28 MED ORDER — DOXYCYCLINE HYCLATE 100 MG PO CAPS: 100.0000 mg | ORAL_CAPSULE | Freq: Two times a day (BID) | ORAL | 0 refills | Status: DC

## 2023-06-28 MED ORDER — DOXYCYCLINE HYCLATE 100 MG PO TABS
100.0000 mg | ORAL_TABLET | Freq: Once | ORAL | Status: AC
  Administered 2023-06-28: 100 mg via ORAL
  Filled 2023-06-28: qty 1

## 2023-06-28 NOTE — ED Provider Notes (Signed)
[place] [facility]MENT AT [facility] Provider Note   CSN: [id number] Arrival date & time: 06/28/23  1422     History  Chief Complaint  Patient presents with   Wound Check    Mark Le is a 88 y.o. male.   Wound Check     88 year old male with medical history significant for DM2, thoracic aortic aneurysm, cirrhosis, CAD, esophageal varices, CKD, gout, presenting to the emergency department with an open wound to his right lower extremity for the last 2 to 3 weeks.  Denies any known injury to the area.  Has difficulty with vision and was not able to fully visualize the area.  He states that he has had bilateral leg swelling, no shortness of breath or chest pain.  He developed an ulcerative wound to the posterior lower extremity and developed surrounding redness and warmth.  No fevers or chills.  Home Medications Prior to Admission medications   Medication Sig Start Date End Date Taking? Authorizing Provider  doxycycline (VIBRAMYCIN) 100 MG capsule Take 1 capsule (100 mg total) by mouth 2 (two) times daily for 7 days. 06/28/23 07/05/23 Yes Ernie Avena, MD  Accu-Chek Softclix Lancets lancets Use to obtain blood sugar sample once daily Dx Code E11.9 11/15/22   Excell Seltzer, MD  aspirin 81 MG tablet Take 81 mg by mouth daily.     [provider]  carvedilol (COREG) 12.5 MG tablet TAKE 1 TABLET (12.5MG  TOTAL) BY MOUTH TWICE A DAY WITH MEALS 05/02/23   Bedsole, Amy E, MD  CVS VITAMIN B12 1000 MCG tablet TAKE 1 TABLET BY MOUTH EVERY DAY 05/03/23   Bedsole, Amy E, MD  donepezil (ARICEPT) 10 MG tablet TAKE 1 TABLET BY MOUTH EVERY DAY 04/08/23   Bedsole, Amy E, MD  glucose blood (ACCU-CHEK GUIDE) test strip Use to check blood sugar once daily. Dx Code E11.9 11/15/22   Bedsole, Amy E, MD  lisinopril (ZESTRIL) 10 MG tablet TAKE 1 TABLET BY MOUTH EVERY DAY 04/29/23   Bedsole, Amy E, MD  Multiple Vitamins-Minerals (ONE-A-DAY MENS 50+ ADVANTAGE) TABS Take 1 tablet by  mouth daily.    [provider]  Multiple Vitamins-Minerals (PRESERVISION AREDS 2) CAPS Take 1 capsule by mouth 2 (two) times daily.    [provider]  simvastatin (ZOCOR) 20 MG tablet TAKE 1 TABLET BY MOUTH EVERYDAY AT BEDTIME 04/08/23   Bedsole, Amy E, MD      Allergies    Iohexol    Review of Systems   Review of Systems  All other systems reviewed and are negative.   Physical Exam Updated Vital Signs BP (!) 175/66   Pulse (!) 52   Temp 98.2 F (36.8 C) (Oral)   Resp 18   SpO2 99%  Physical Exam Vitals and nursing note reviewed. Exam conducted with a chaperone present.  Constitutional:      General: He is not in acute distress.    Appearance: He is well-developed.  HENT:     Head: Normocephalic and atraumatic.  Eyes:     Conjunctiva/sclera: Conjunctivae normal.  Cardiovascular:     Rate and Rhythm: Normal rate and regular rhythm.  Pulmonary:     Effort: Pulmonary effort is normal. No respiratory distress.     Breath sounds: Normal breath sounds.  Abdominal:     Palpations: Abdomen is soft.     Tenderness: There is no abdominal tenderness.  Genitourinary:    Comments: No melena or hematochezia, fecal occult negative Musculoskeletal:  General: Swelling present.     Cervical back: Neck supple.     Comments: Right lower extremity with an ulcerative wound with surrounding erythema, warmth, mild tenderness, 1+ pitting edema in the bilateral lower extremities  Skin:    General: Skin is warm and dry.     Capillary Refill: Capillary refill takes less than 2 seconds.  Neurological:     Mental Status: He is alert.  Psychiatric:        Mood and Affect: Mood normal.     ED Results / Procedures / Treatments   Labs (all labs ordered are listed, but only abnormal results are displayed) Labs Reviewed  CBC WITH DIFFERENTIAL/PLATELET - Abnormal; Notable for the following components:      Result Value   RBC 2.22 (*)    Hemoglobin 7.4 (*)    HCT 22.3  (*)    MCV 100.5 (*)    All other components within normal limits  BASIC METABOLIC PANEL - Abnormal; Notable for the following components:   Calcium 8.6 (*)    All other components within normal limits  CULTURE, BLOOD (ROUTINE X 2)  CULTURE, BLOOD (ROUTINE X 2)  OCCULT BLOOD X 1 CARD TO LAB, STOOL    EKG None  Radiology No results found.  Procedures Procedures    Medications Ordered in ED Medications  doxycycline (VIBRA-TABS) tablet 100 mg (100 mg Oral Given 06/28/23 1622)    ED Course/ Medical Decision Making/ A&P Clinical Course as of 06/28/23 1855  Fri Jun 28, 2023  1745 Blood culture (routine x 2) [JL]    Clinical Course User Index [JL] Ernie Avena, MD                                 Medical Decision Making Amount and/or Complexity of Data Reviewed Labs: ordered. Decision-making details documented in ED Course.  Risk Prescription drug management.    88 year old male with medical history significant for DM2, thoracic aortic aneurysm, cirrhosis, CAD, esophageal varices, CKD, gout, presenting to the emergency department with an open wound to his right lower extremity for the last 2 to 3 weeks.  Denies any known injury to the area.  Has difficulty with vision and was not able to fully visualize the area.  He states that he has had bilateral leg swelling, no shortness of breath or chest pain.  He developed an ulcerative wound to the posterior lower extremity and developed surrounding redness and warmth.  No fevers or chills.  On arrival, the patient was afebrile, not tachycardic or tachypneic, BP 153/66, saturating 98% on room air.  Physical exam as per above with an ulcerative wound which appears to be skin breakdown in the setting of lower extremity swelling with some surrounding erythema.  No chest pain or shortness of breath.  Swelling in the lower extremities has been present along with the wound for the last 2 to 3 weeks.  Now with erythema and warmth.  Suspect  mild skin breakdown in the setting of edema, which family state been chronic for the past year.  No chest pain or shortness of breath, lungs clear on exam.  Screening labs also obtained.  Patient does not appear to be septic.  Overall well-appearing.  Labs: CBC without a leukocytosis, WBC 7.4, evidence of an anemia with a hemoglobin of 7.4 which is a 4 point drop from previous measurements.  The patient's rectal exam was negative, he denies any  history of melena or hematochezia.  He denies any symptoms of lightheadedness, fatigue or shortness of breath.  BMP was unremarkable with mildly low calcium to 8.6.  Blood cultures were collected and pending.  Will discharge the patient on doxycycline.  Considered admission for observation in the setting of the patient's hemoglobin dropped, low concern for acute GI bleed at this time the patient is asymptomatic from anemia standpoint.  Recommend that he follow-up with the next week with his PCP for recheck of his home hemoglobin and outpatient anemia workup, return precautions provided.  Will plan to discharge the patient with PCP follow-up and outpatient referral to wound care was placed.  Patient advised outpatient wound care, will prescribe an antibiotic for a short course, advised close follow-up for repeat assessment.   Final Clinical Impression(s) / ED Diagnoses Final diagnoses:  Wound of right lower extremity, initial encounter  Cellulitis of anterior lower leg  Anemia, unspecified type    Rx / DC Orders ED Discharge Orders          Ordered    doxycycline (VIBRAMYCIN) 100 MG capsule  2 times daily        06/28/23 1852              Ernie Avena, MD 06/28/23 [phone]

## 2023-06-28 NOTE — ED Triage Notes (Signed)
Pt c/o "open wound" x2-3wks." Denies known injury, states area is "sore, but not painful." Pt has approx 1.5 x2in sore to posterior calf

## 2023-06-28 NOTE — ED Notes (Signed)
Dressed and cleaned wound on right posterior calf with wound cleaner, bactrim ointment, non-stick padding and wrapped with gauze. Pt tolerated well.

## 2023-06-28 NOTE — Discharge Instructions (Addendum)
You have had a hemoglobin drop but it is not low enough to require blood transfusion, there is no evidence of acute gastrointestinal bleeding at this time.  You have no acute symptoms of anemia requiring hospitalization or blood transfusion.  Recommend you follow-up with your primary doctor within a week for recheck of your blood counts, return for any symptoms of anemia which include shortness of breath, severe fatigue, lightheadedness or syncope.  Additionally, regarding your chronic wound we will start you on doxycycline, recommend you follow-up with the wound care center, dress daily as well.  Return for any  signs of systemic illness which include fever, chills, worsening purulent drainage or spreading redness or worsening pain.

## 2023-06-28 NOTE — ED Notes (Addendum)
ED Provider at bedside. 

## 2023-06-28 NOTE — ED Notes (Signed)
Urine sample collected. Sent to lab to hold.

## 2023-07-02 ENCOUNTER — Ambulatory Visit (INDEPENDENT_AMBULATORY_CARE_PROVIDER_SITE_OTHER): Payer: Medicare Other | Admitting: Family Medicine

## 2023-07-02 ENCOUNTER — Encounter: Payer: Self-pay | Admitting: Family Medicine

## 2023-07-02 VITALS — BP 122/62 | HR 62 | Temp 97.8°F | Ht 74.0 in | Wt 183.5 lb

## 2023-07-02 DIAGNOSIS — E1169 Type 2 diabetes mellitus with other specified complication: Secondary | ICD-10-CM | POA: Diagnosis not present

## 2023-07-02 DIAGNOSIS — E11622 Type 2 diabetes mellitus with other skin ulcer: Secondary | ICD-10-CM | POA: Insufficient documentation

## 2023-07-02 DIAGNOSIS — D649 Anemia, unspecified: Secondary | ICD-10-CM | POA: Diagnosis not present

## 2023-07-02 DIAGNOSIS — L03115 Cellulitis of right lower limb: Secondary | ICD-10-CM | POA: Diagnosis not present

## 2023-07-02 DIAGNOSIS — L97919 Non-pressure chronic ulcer of unspecified part of right lower leg with unspecified severity: Secondary | ICD-10-CM

## 2023-07-02 DIAGNOSIS — D509 Iron deficiency anemia, unspecified: Secondary | ICD-10-CM | POA: Insufficient documentation

## 2023-07-02 MED ORDER — DOXYCYCLINE HYCLATE 100 MG PO CAPS: 100.0000 mg | ORAL_CAPSULE | Freq: Two times a day (BID) | ORAL | 0 refills | Status: AC

## 2023-07-02 NOTE — Progress Notes (Signed)
Patient ID: Mark Le, male    DOB: August 22, 1934, 88 y.o.   MRN: [id number]  This visit was conducted in person.  BP 122/62 (BP Location: Left Arm, Patient Position: Sitting, Cuff Size: Large)   Pulse 62   Temp 97.8 F (36.6 C) (Temporal)   Ht 6\' 2"  (1.88 m)   Wt 183 lb 8 oz (83.2 kg)   SpO2 99%   BMI 23.56 kg/m    CC:  Chief Complaint  Patient presents with   Follow-up    ER follow up 06/28/2023    Subjective:   HPI: Mark Le is a 88 y.o. male with medical history significant for DM2, thoracic aortic aneurysm, cirrhosis, CAD, esophageal varices, CKD, gout  presenting on 07/02/2023 for Follow-up (ER follow up 06/28/2023)  Reviewed ED note from June 28, 2023 seen for redness and pain at open wound in right lower extremity worsening over the previous 2 to 3 weeks. CBC showed white blood cells 7.4 Significant new drop in hemoglobin to 7.4 Denied melena or hematochezia  Negative heme occult card. No increase in fatigue, no CP, no SOB.  No abdominal pain.  " I always feel lazy" Started on doxycycline  Referral to wound care placed, but per great niece, Mark Le, he cannot get in until end of January    Diabetes:  well controlled with diet. Lab Results  Component Value Date   HGBA1C 5.2 02/06/2023      Relevant past medical, surgical, family and social history reviewed and updated as indicated. Interim medical history since our last visit reviewed. Allergies and medications reviewed and updated. Outpatient Medications Prior to Visit  Medication Sig Dispense Refill   Accu-Chek Softclix Lancets lancets Use to obtain blood sugar sample once daily Dx Code E11.9 100 each 4   aspirin 81 MG tablet Take 81 mg by mouth daily.      carvedilol (COREG) 12.5 MG tablet TAKE 1 TABLET (12.5MG  TOTAL) BY MOUTH TWICE A DAY WITH MEALS 180 tablet 3   CVS VITAMIN B12 1000 MCG tablet TAKE 1 TABLET BY MOUTH EVERY DAY 200 tablet 1   donepezil (ARICEPT) 10 MG tablet TAKE 1 TABLET BY  MOUTH EVERY DAY 90 tablet 3   glucose blood (ACCU-CHEK GUIDE) test strip Use to check blood sugar once daily. Dx Code E11.9 100 each 4   lisinopril (ZESTRIL) 10 MG tablet TAKE 1 TABLET BY MOUTH EVERY DAY 90 tablet 1   Multiple Vitamins-Minerals (ONE-A-DAY MENS 50+ ADVANTAGE) TABS Take 1 tablet by mouth daily.     Multiple Vitamins-Minerals (PRESERVISION AREDS 2) CAPS Take 1 capsule by mouth 2 (two) times daily.     simvastatin (ZOCOR) 20 MG tablet TAKE 1 TABLET BY MOUTH EVERYDAY AT BEDTIME 90 tablet 1   doxycycline (VIBRAMYCIN) 100 MG capsule Take 1 capsule (100 mg total) by mouth 2 (two) times daily for 7 days. 14 capsule 0   No facility-administered medications prior to visit.     Per HPI unless specifically indicated in ROS section below Review of Systems  Constitutional:  Negative for fatigue and fever.  HENT:  Negative for ear pain.   Eyes:  Negative for pain.  Respiratory:  Negative for cough and shortness of breath.   Cardiovascular:  Negative for chest pain, palpitations and leg swelling.  Gastrointestinal:  Negative for abdominal pain.  Genitourinary:  Negative for dysuria.  Musculoskeletal:  Negative for arthralgias.  Neurological:  Negative for syncope, light-headedness and headaches.  Psychiatric/Behavioral:  Negative for  dysphoric mood.    Objective:  BP 122/62 (BP Location: Left Arm, Patient Position: Sitting, Cuff Size: Large)   Pulse 62   Temp 97.8 F (36.6 C) (Temporal)   Ht 6\' 2"  (1.88 m)   Wt 183 lb 8 oz (83.2 kg)   SpO2 99%   BMI 23.56 kg/m   Wt Readings from Last 3 Encounters:  07/02/23 183 lb 8 oz (83.2 kg)  02/20/23 177 lb (80.3 kg)  01/30/23 178 lb (80.7 kg)      Physical Exam Vitals reviewed.  Constitutional:      Appearance: He is well-developed.  HENT:     Head: Normocephalic.     Right Ear: Hearing normal.     Left Ear: Hearing normal.     Nose: Nose normal.  Neck:     Thyroid: No thyroid mass or thyromegaly.     Vascular: No carotid  bruit.     Trachea: Trachea normal.  Cardiovascular:     Rate and Rhythm: Normal rate and regular rhythm.     Pulses: Normal pulses.     Heart sounds: Heart sounds not distant. No murmur heard.    No friction rub. No gallop.     Comments: No peripheral edema Pulmonary:     Effort: Pulmonary effort is normal. No respiratory distress.     Breath sounds: Normal breath sounds.  Skin:    General: Skin is warm and dry.     Findings: Erythema and wound present. No rash.     Comments: See photo  Psychiatric:        Speech: Speech normal.        Behavior: Behavior normal.        Thought Content: Thought content normal.       Results for orders placed or performed during the hospital encounter of 06/28/23  Blood culture (routine x 2)   Collection Time: 06/28/23  4:01 PM   Specimen: BLOOD  Result Value Ref Range   Specimen Description      BLOOD RIGHT ANTECUBITAL Performed at [facility], [address], [place], [place] [address]    Special Requests      BOTTLES DRAWN AEROBIC AND ANAEROBIC Blood Culture adequate volume Performed at [facility], [address], [place], [place] [address]    Culture      NO GROWTH 4 DAYS Performed at [facility], [address] [place], [place] [address]    Report Status PENDING   CBC with Differential   Collection Time: 06/28/23  4:01 PM  Result Value Ref Range   WBC 7.4 4.0 - 10.5 K/uL   RBC 2.22 (L) 4.22 - 5.81 MIL/uL   Hemoglobin 7.4 (L) 13.0 - 17.0 g/dL   HCT 91.4 (L) 78.2 - 95.6 %   MCV 100.5 (H) 80.0 - 100.0 fL   MCH 33.3 26.0 - 34.0 pg   MCHC 33.2 30.0 - 36.0 g/dL   RDW 21.3 08.6 - 57.8 %   Platelets 235 150 - 400 K/uL   nRBC 0.0 0.0 - 0.2 %   Neutrophils Relative % 61 %   Neutro Abs 4.6 1.7 - 7.7 K/uL   Lymphocytes Relative 25 %   Lymphs Abs 1.9 0.7 - 4.0 K/uL   Monocytes Relative 11 %   Monocytes Absolute 0.8 0.1 - 1.0 K/uL   Eosinophils Relative 3 %   Eosinophils Absolute 0.2  0.0 - 0.5 K/uL   Basophils Relative 0 %   Basophils Absolute  0.0 0.0 - 0.1 K/uL   Immature Granulocytes 0 %   Abs Immature Granulocytes 0.03 0.00 - 0.07 K/uL  Basic metabolic panel   Collection Time: 06/28/23  4:01 PM  Result Value Ref Range   Sodium 137 135 - 145 mmol/L   Potassium 4.1 3.5 - 5.1 mmol/L   Chloride 102 98 - 111 mmol/L   CO2 30 22 - 32 mmol/L   Glucose, Bld 90 70 - 99 mg/dL   BUN 12 8 - 23 mg/dL   Creatinine, Ser 6.21 0.61 - 1.24 mg/dL   Calcium 8.6 (L) 8.9 - 10.3 mg/dL   GFR, Estimated >30 >86 mL/min   Anion gap 5 5 - 15  Blood culture (routine x 2)   Collection Time: 06/28/23  4:03 PM   Specimen: BLOOD  Result Value Ref Range   Specimen Description      BLOOD BLOOD LEFT FOREARM Performed at [facility], [address] [place], [place] [address]    Special Requests      Blood Culture adequate volume BOTTLES DRAWN AEROBIC AND ANAEROBIC Performed at [facility], [address] [place], [place] [address]    Culture      NO GROWTH 4 DAYS Performed at [facility], [address], [place], [place] [address]    Report Status PENDING   Occult blood card to lab, stool   Collection Time: 06/28/23  5:21 PM  Result Value Ref Range   Fecal Occult Bld NEGATIVE NEGATIVE    Assessment and Plan  Anemia, unspecified type Assessment & Plan: Acute 4 point drop of hemoglobin.  No clear GI blood loss.  Will reevaluate his hemoglobin today as well as iron panel.  May need referral to hematology.  Orders: -     CBC with Differential/Platelet -     IBC + Ferritin -     Vitamin B12  Cellulitis of right lower leg Assessment & Plan: Acute, significant improvement in swelling pain and redness after initiation of doxycycline 100 mg p.o. twice daily.  He was given a 7-day course and I will extend this to a full 10-day course.  There is evidence of new or swelling in the right lower extremity.  Recommended elevation of his leg  when sitting above his heart as well as walking is much as tolerated.  Given diabetes and debilitated state, I will change wound care referral to urgent to see if we can get and in within the next few weeks.  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  Development of ulcer right posterior lower leg    Orders: -     AMB referral to wound care center  Type 2 diabetes mellitus with other specified complication, without long-term current use of insulin (HCC) Assessment & Plan: Chronic, well-controlled with diet   Other orders -     Doxycycline Hyclate; Take 1 capsule (100 mg total) by mouth 2 (two) times daily for 3 days.  Dispense: 6 capsule; Refill: 0    No follow-ups on file.   Kerby Nora, MD

## 2023-07-02 NOTE — Assessment & Plan Note (Signed)
Chronic, well controlled with diet. 

## 2023-07-02 NOTE — Assessment & Plan Note (Signed)
Acute, significant improvement in swelling pain and redness after initiation of doxycycline 100 mg p.o. twice daily.  He was given a 7-day course and I will extend this to a full 10-day course.  There is evidence of new or swelling in the right lower extremity.  Recommended elevation of his leg when sitting above his heart as well as walking is much as tolerated.  Given diabetes and debilitated state, I will change wound care referral to urgent to see if we can get and in within the next few weeks.

## 2023-07-02 NOTE — Assessment & Plan Note (Addendum)
Acute 4 point drop of hemoglobin.  No clear GI blood loss.  Will reevaluate his hemoglobin today as well as iron panel and B12. May need referral to hematology.

## 2023-07-02 NOTE — Assessment & Plan Note (Signed)
Acute, no known injury.  Development of ulcer right posterior lower leg

## 2023-07-02 NOTE — Patient Instructions (Signed)
Elevate leg above your heart as able when relaxing.  Complete 10 days of antibiotics.

## 2023-07-03 LAB — IBC + FERRITIN
Ferritin: 98.3 ng/mL (ref 22.0–322.0)
Iron: 54 ug/dL (ref 42–165)
Saturation Ratios: 21.2 % (ref 20.0–50.0)
TIBC: 254.8 ug/dL (ref 250.0–450.0)
Transferrin: 182 mg/dL — ABNORMAL LOW (ref 212.0–360.0)

## 2023-07-03 LAB — CBC WITH DIFFERENTIAL/PLATELET
Basophils Absolute: 0.1 10*3/uL (ref 0.0–0.1)
Basophils Relative: 1.2 % (ref 0.0–3.0)
Eosinophils Absolute: 0.3 10*3/uL (ref 0.0–0.7)
Eosinophils Relative: 6.6 % — ABNORMAL HIGH (ref 0.0–5.0)
HCT: 33 % — ABNORMAL LOW (ref 39.0–52.0)
Hemoglobin: 11.2 g/dL — ABNORMAL LOW (ref 13.0–17.0)
Lymphocytes Relative: 37.7 % (ref 12.0–46.0)
Lymphs Abs: 1.6 10*3/uL (ref 0.7–4.0)
MCHC: 33.8 g/dL (ref 30.0–36.0)
MCV: 99.9 fL (ref 78.0–100.0)
Monocytes Absolute: 0.5 10*3/uL (ref 0.1–1.0)
Monocytes Relative: 11.7 % (ref 3.0–12.0)
Neutro Abs: 1.8 10*3/uL (ref 1.4–7.7)
Neutrophils Relative %: 42.8 % — ABNORMAL LOW (ref 43.0–77.0)
Platelets: 247 10*3/uL (ref 150.0–400.0)
RBC: 3.3 Mil/uL — ABNORMAL LOW (ref 4.22–5.81)
RDW: 13.7 % (ref 11.5–15.5)
WBC: 4.3 10*3/uL (ref 4.0–10.5)

## 2023-07-03 LAB — CULTURE, BLOOD (ROUTINE X 2)
Culture: NO GROWTH
Culture: NO GROWTH
Special Requests: ADEQUATE
Special Requests: ADEQUATE

## 2023-07-03 LAB — VITAMIN B12: Vitamin B-12: 849 pg/mL (ref 211–911)

## 2023-07-08 ENCOUNTER — Encounter (HOSPITAL_BASED_OUTPATIENT_CLINIC_OR_DEPARTMENT_OTHER): Payer: Medicare Other | Attending: Internal Medicine | Admitting: Internal Medicine

## 2023-07-08 ENCOUNTER — Encounter: Payer: Self-pay | Admitting: Family Medicine

## 2023-07-08 DIAGNOSIS — K7030 Alcoholic cirrhosis of liver without ascites: Secondary | ICD-10-CM | POA: Insufficient documentation

## 2023-07-08 DIAGNOSIS — N189 Chronic kidney disease, unspecified: Secondary | ICD-10-CM | POA: Insufficient documentation

## 2023-07-08 DIAGNOSIS — L97212 Non-pressure chronic ulcer of right calf with fat layer exposed: Secondary | ICD-10-CM | POA: Diagnosis not present

## 2023-07-08 DIAGNOSIS — I129 Hypertensive chronic kidney disease with stage 1 through stage 4 chronic kidney disease, or unspecified chronic kidney disease: Secondary | ICD-10-CM | POA: Diagnosis not present

## 2023-07-08 DIAGNOSIS — E1122 Type 2 diabetes mellitus with diabetic chronic kidney disease: Secondary | ICD-10-CM | POA: Insufficient documentation

## 2023-07-08 DIAGNOSIS — I2510 Atherosclerotic heart disease of native coronary artery without angina pectoris: Secondary | ICD-10-CM | POA: Diagnosis not present

## 2023-07-08 DIAGNOSIS — E11622 Type 2 diabetes mellitus with other skin ulcer: Secondary | ICD-10-CM | POA: Diagnosis not present

## 2023-07-08 DIAGNOSIS — M109 Gout, unspecified: Secondary | ICD-10-CM | POA: Diagnosis not present

## 2023-07-08 DIAGNOSIS — I87311 Chronic venous hypertension (idiopathic) with ulcer of right lower extremity: Secondary | ICD-10-CM | POA: Insufficient documentation

## 2023-07-08 DIAGNOSIS — L97818 Non-pressure chronic ulcer of other part of right lower leg with other specified severity: Secondary | ICD-10-CM | POA: Insufficient documentation

## 2023-07-08 MED ORDER — IRON (FERROUS SULFATE) 325 (65 FE) MG PO TABS: 325.0000 mg | ORAL_TABLET | Freq: Every day | ORAL | 5 refills | Status: DC

## 2023-07-09 DIAGNOSIS — S81801A Unspecified open wound, right lower leg, initial encounter: Secondary | ICD-10-CM | POA: Diagnosis not present

## 2023-07-09 NOTE — Progress Notes (Signed)
GOTTFRIED, GIONFRIDDO ([id number]) [id number] Page 1 of 4 Visit Report for 07/08/2023 Abuse Risk Screen Details Patient Name: Date of Service: Velia Meyer, [place] UL E. 07/08/2023 9:00 A M Medical Record Number: [id number] Patient Account Number: [id number] Date of Birth/Sex: Treating RN: 04/02/1935 (88 y.o. Tammy Sours Primary Care Shevawn Langenberg: Kerby Nora Other Clinician: Referring Lauris Serviss: Treating Kamyra Schroeck/Extender: Myrle Sheng, Amy Weeks in Treatment: 0 Abuse Risk Screen Items Answer ABUSE RISK SCREEN: Has anyone close to you tried to hurt or harm you recentlyo No Do you feel uncomfortable with anyone in your familyo No Has anyone forced you do things that you didnt want to doo No Electronic Signature(s) Signed: 07/08/2023 4:50:55 PM By: Shawn Stall RN, BSN Entered By: Shawn Stall on 07/08/2023 09:13:12 -------------------------------------------------------------------------------- Activities of Daily Living Details Patient Name: Date of Service: [name], [place] UL E. 07/08/2023 9:00 A M Medical Record Number: [id number] Patient Account Number: [id number] Date of Birth/Sex: Treating RN: 02-25-1935 (88 y.o. Tammy Sours Primary Care Suleika Donavan: Kerby Nora Other Clinician: Referring Arnetra Terris: Treating Lashan Gluth/Extender: Myrle Sheng, Amy Weeks in Treatment: 0 Activities of Daily Living Items Answer Activities of Daily Living (Please select one for each item) Drive Automobile Not Able T Medications ake Completely Able Use T elephone Completely Able Care for Appearance Completely Able Use T oilet Completely Able Bath / Shower Completely Able Dress Self Completely Able Feed Self Completely Able Walk Completely Able Get In / Out Bed Completely Able Housework Need Assistance Prepare Meals Not Able Handle Money Completely Able Shop for Self Completely Able Electronic Signature(s) Signed: 07/08/2023 4:50:55 PM By: Shawn Stall RN, BSN Entered By: Shawn Stall on 07/08/2023 09:14:17 Fransisco Beau ([id number]) [id number] Page 2 of 4 -------------------------------------------------------------------------------- Education Screening Details Patient Name: Date of Service: [name], PA UL E. 07/08/2023 9:00 A M Medical Record Number: [id number] Patient Account Number: [id number] Date of Birth/Sex: Treating RN: 17-Aug-1934 (88 y.o. Tammy Sours Primary Care Hezekiah Veltre: Kerby Nora Other Clinician: Referring Kasen Sako: Treating Jezreel Sisk/Extender: Myrle Sheng, Amy Weeks in Treatment: 0 Primary Learner Assessed: Patient Learning Preferences/Education Level/Primary Language Learning Preference: Explanation, Demonstration, Printed Material Highest Education Level: High School Preferred Language: English Cognitive Barrier Language Barrier: No Translator Needed: No Memory Deficit: No Emotional Barrier: No Cultural/Religious Beliefs Affecting Medical Care: No Physical Barrier Impaired Vision: Yes Glasses, reading Impaired Hearing: No Decreased Hand dexterity: No Knowledge/Comprehension Knowledge Level: High Comprehension Level: High Ability to understand written instructions: High Ability to understand verbal instructions: High Motivation Anxiety Level: Calm Cooperation: Cooperative Education Importance: Acknowledges Need Interest in Health Problems: Asks Questions Perception: Coherent Willingness to Engage in Self-Management High Activities: Readiness to Engage in Self-Management High Activities: Electronic Signature(s) Signed: 07/08/2023 4:50:55 PM By: Shawn Stall RN, BSN Entered By: Shawn Stall on 07/08/2023 09:14:49 -------------------------------------------------------------------------------- Fall Risk Assessment Details Patient Name: Date of Service: [name], PA UL E. 07/08/2023 9:00 A M Medical Record Number: [id number] Patient Account  Number: [id number] Date of Birth/Sex: Treating RN: [birth year] (88 y.o. Tammy Sours Primary Care Joselyn Edling: Kerby Nora Other Clinician: Referring Joline Encalada: Treating Demetric Dunnaway/Extender: Myrle Sheng, Amy Weeks in Treatment: 0 Fall Risk Assessment Items Have you had 2 or more falls in the last [address] monthso 0 No CHESTON, ROSENQUIST ([id number]) [id number] Page 3 of 4 Have you had any fall that resulted in injury in the last 12 monthso 0 No FALLS RISK SCREEN History of falling - immediate or within 3 months 0 No Secondary diagnosis (Do you have  2 or more medical diagnoseso) 0 No Ambulatory aid None/bed rest/wheelchair/nurse 0 Yes Crutches/cane/walker 0 No Furniture 0 No Intravenous therapy Access/Saline/Heparin Lock 0 No Gait/Transferring Normal/ bed rest/ wheelchair 0 Yes Weak (short steps with or without shuffle, stooped but able to lift head while walking, may seek 0 No support from furniture) Impaired (short steps with shuffle, may have difficulty arising from chair, head down, impaired 0 No balance) Mental Status Oriented to own ability 0 Yes Electronic Signature(s) Signed: 07/08/2023 4:50:55 PM By: Shawn Stall RN, BSN Entered By: Shawn Stall on 07/08/2023 09:15:08 -------------------------------------------------------------------------------- Foot Assessment Details Patient Name: Date of Service: [name], PA UL E. 07/08/2023 9:00 A M Medical Record Number: [id number] Patient Account Number: [id number] Date of Birth/Sex: Treating RN: [birth year] (88 y.o. Tammy Sours Primary Care Castle Lamons: Kerby Nora Other Clinician: Referring Wren Pryce: Treating Chaniyah Jahr/Extender: Myrle Sheng, Amy Weeks in Treatment: 0 Foot Assessment Items Site Locations + = Sensation present, - = Sensation absent, C = Callus, U = Ulcer R = Redness, W = Warmth, M = Maceration, PU = Pre-ulcerative lesion F = Fissure, S = Swelling, D =  Dryness Assessment Right: Left: Other Deformity: No No Prior Foot Ulcer: No No Prior Amputation: No No Charcot Joint: No No Ambulatory Status: Ambulatory Without Help GaitTILLMAN, INSERRA ([id number]) [phone] [id number] Page 4 of 4 Electronic Signature(s) Signed: 07/08/2023 4:50:55 PM By: Shawn Stall RN, BSN Entered By: Shawn Stall on 07/08/2023 09:20:02 -------------------------------------------------------------------------------- Nutrition Risk Screening Details Patient Name: Date of Service: [name], PA UL E. 07/08/2023 9:00 A M Medical Record Number: [id number] Patient Account Number: [id number] Date of Birth/Sex: Treating RN: [birth year] (88 y.o. Tammy Sours Primary Care Herby Amick: Kerby Nora Other Clinician: Referring Myranda Pavone: Treating Siddhartha Hoback/Extender: Myrle Sheng, Amy Weeks in Treatment: 0 Height (in): 74 Weight (lbs): 183 Body Mass Index (BMI): 23.5 Nutrition Risk Screening Items Score Screening NUTRITION RISK SCREEN: I have an illness or condition that made me change the kind and/or amount of food I eat 2 Yes I eat fewer than two meals per day 0 No I eat few fruits and vegetables, or milk products 0 No I have three or more drinks of beer, liquor or wine almost every day 0 No I have tooth or mouth problems that make it hard for me to eat 0 No I don't always have enough money to buy the food I need 0 No I eat alone most of the time 0 No I take three or more different prescribed or over-the-counter drugs a day 1 Yes Without wanting to, I have lost or gained 10 pounds in the last six months 0 No I am not always physically able to shop, cook and/or feed myself 0 No Nutrition Protocols Good Risk Protocol Provide education on elevated blood Moderate Risk Protocol 0 sugars and impact on wound healing, as applicable High Risk Proctocol Risk Level: Moderate Risk Score: 3 Electronic Signature(s) Signed: 07/08/2023  4:50:55 PM By: Shawn Stall RN, BSN Entered By: Shawn Stall on 07/08/2023 09:15:15

## 2023-07-09 NOTE — Progress Notes (Signed)
Le, JACOBY ([id number]) [id number] Page 1 of 8 Visit Report for 07/08/2023 Chief Complaint Document Details Patient Name: Date of Service: Mark Le, [place] UL E. 07/08/2023 9:00 A M Medical Record Number: [id number] Patient Account Number: [id number] Date of Birth/Sex: Treating RN: [birth year] (88 y.o. M) Primary Care Provider: Kerby Nora Other Clinician: Referring Provider: Treating Provider/Extender: Myrle Sheng, Amy Weeks in Treatment: 0 Information Obtained from: Patient Chief Complaint 07/08/2023; patient is here for review of a wound on the right lower leg Electronic Signature(s) Signed: 07/09/2023 11:51:18 AM By: Baltazar Najjar MD Entered By: Baltazar Najjar on 07/08/2023 06:43:56 -------------------------------------------------------------------------------- Debridement Details Patient Name: Date of Service: Mark Meyer, PA UL E. 07/08/2023 9:00 A M Medical Record Number: [id number] Patient Account Number: [id number] Date of Birth/Sex: Treating RN: 11/05/1934 (88 y.o. Tammy Sours Primary Care Provider: Kerby Nora Other Clinician: Referring Provider: Treating Provider/Extender: Myrle Sheng, Amy Weeks in Treatment: 0 Debridement Performed for Assessment: Wound #1 Right,Posterior Lower Leg Performed By: Clinician Shawn Stall, RN Debridement Type: Chemical/Enzymatic/Mechanical Agent Used: wound cleanser and gauze Severity of Tissue Pre Debridement: Fat layer exposed Level of Consciousness (Pre-procedure): Awake and Alert Pre-procedure Verification/Time Out No Taken: Percent of Wound Bed Debrided: Bleeding: None Response to Treatment: Procedure was tolerated well Level of Consciousness (Post- Awake and Alert procedure): Post Debridement Measurements of Total Wound Length: (cm) 2.5 Width: (cm) 2 Depth: (cm) 0.1 Volume: (cm) 0.393 Character of Wound/Ulcer Post Debridement: Improved Severity of Tissue Post  Debridement: Fat layer exposed Post Procedure Diagnosis Same as Pre-procedure Electronic Signature(s) Signed: 07/08/2023 4:50:55 PM By: Shawn Stall RN, BSN Signed: 07/09/2023 11:51:18 AM By: Baltazar Najjar MD Fransisco Beau ([id number]) [phone].pdf Page 2 of 8 Entered By: Shawn Stall on 07/08/2023 06:32:16 -------------------------------------------------------------------------------- HPI Details Patient Name: Date of Service: Mark RDY, PA UL E. 07/08/2023 9:00 A M Medical Record Number: [id number] Patient Account Number: [id number] Date of Birth/Sex: Treating RN: 04/27/1935 (88 y.o. M) Primary Care Provider: Kerby Nora Other Clinician: Referring Provider: Treating Provider/Extender: Myrle Sheng, Amy Weeks in Treatment: 0 History of Present Illness HPI Description: ADMISSION 07/08/2023 This is an 88 year old man who arrived accompanied by his sister and niece. He has had a wound on the right lower leg for about the last month perhaps more. He was seen in the ER on 06/28/2023. It was noted that he did not have a history of injury to the area but does that have a history of bilateral leg swelling. He was given doxycycline for 7 days. He saw his primary doctor on 12/17 the doxycycline was extended to 10 days. They have been using topical Neosporin gauze and Coban. Things have been improving Past medical history includes diet-controlled type 2 diabetes, thoracic aortic aneurysm, alcohol induced cirrhosis, coronary artery disease, esophageal varices, chronic kidney disease, gout and macular degeneration. The patient lives with his sister who is present ABI in the right was 1.13 in our clinic Electronic Signature(s) Signed: 07/09/2023 11:51:18 AM By: Baltazar Najjar MD Entered By: Baltazar Najjar on 07/08/2023 06:45:43 -------------------------------------------------------------------------------- Physical Exam Details Patient Name: Date of  Service: Mark RDY, PA UL E. 07/08/2023 9:00 A M Medical Record Number: [id number] Patient Account Number: [id number] Date of Birth/Sex: Treating RN: [birth year] (88 y.o. M) Primary Care Provider: Kerby Nora Other Clinician: Referring Provider: Treating Provider/Extender: Myrle Sheng, Amy Weeks in Treatment: 0 Constitutional Sitting or standing Blood Pressure is within target range for patient.. Pulse regular and within target range for patient.Marland Kitchen Respirations regular, non-labored and within target range.Marland Kitchen  Temperature is normal and within the target range for the patient.Marland Kitchen Appears in no distress. Cardiovascular No signs of CHF. Pedal pulses palpable on the right. 2+ pitting edema in the bilateral lower extremities. In the mid aspect of the right leg he has hemosiderin deposition this is in the area of the wound in question. Notes Wound exam; on the right medial lower leg the wound is present. This has no depth epithelializing. Looks a lot better than the ER picture from 2 weeks ago. There is no evidence of surrounding infection Electronic Signature(s) Signed: 07/09/2023 11:51:18 AM By: Baltazar Najjar MD Entered By: Baltazar Najjar on 07/08/2023 06:47:27 VICK, BARRIGA ([id number]) [id number] Page 3 of 8 -------------------------------------------------------------------------------- Physician Orders Details Patient Name: Date of Service: Mark RDY, PA UL E. 07/08/2023 9:00 A M Medical Record Number: [id number] Patient Account Number: [id number] Date of Birth/Sex: Treating RN: [birth year] (88 y.o. Tammy Sours Primary Care Provider: Kerby Nora Other Clinician: Referring Provider: Treating Provider/Extender: Myrle Sheng, Amy Weeks in Treatment: 0 The following information was scribed by: Shawn Stall The information was scribed for: Baltazar Najjar Verbal / Phone Orders: No Diagnosis Coding Follow-up Appointments ppointment in 2  weeks. - Dr Mikey Bussing 2nd week of January 2025 (front office to schedule) Return A Other: - [employer]. Anesthetic (In clinic) Topical Lidocaine 4% applied to wound bed Bathing/ Shower/ Hygiene May shower and wash wound with soap and water. - with dressing changes. Edema Control - Orders / Instructions Elevate legs to the level of the heart or above for 30 minutes daily and/or when sitting for 3-4 times a day throughout the day. Avoid standing for long periods of time. Exercise regularly Moisturize legs daily. Wound Treatment Wound #1 - Lower Leg Wound Laterality: Right, Posterior Cleanser: Soap and Water Every Other Day/15 Days Discharge Instructions: May shower and wash wound with dial antibacterial soap and water prior to dressing change. Cleanser: [employer] Ancillary Kit - 15 Day Supply (DME) (Generic) Every Other Day/15 Days Discharge Instructions: Use supplies as instructed; Kit contains: (15) Saline Bullets; (15) 3x3 Gauze; 15 pr Gloves Peri-Wound Care: Skin Prep (DME) (Generic) Every Other Day/15 Days Discharge Instructions: Use skin prep as directed Prim Dressing: Hydrofera Blue Ready Transfer Foam, 2.5x2.5 (in/in) (DME) (Generic) Every Other Day/15 Days ary Discharge Instructions: Apply directly to wound bed as directed Secondary Dressing: Zetuvit Plus Silicone Border Dressing 4x4 (in/in) (DME) (Generic) Every Other Day/15 Days Discharge Instructions: Apply silicone border over primary dressing as directed. Compression Wrap: tubigrip size E double layer Every Other Day/15 Days Discharge Instructions: apply in the morning and remove at night. Patient Medications llergies: iohexol A Notifications Medication Indication Start End applied only in clinic for12/23/2024 lidocaine debridement by provider. DOSE topical 4 % cream - small amount of cream topically Electronic Signature(s) Signed: 07/08/2023 4:50:55 PM By: Shawn Stall RN, BSN Signed: 07/09/2023 11:51:18 AM By:  Baltazar Najjar MD Entered By: Shawn Stall on 07/08/2023 06:36:22 JAMIEON, DOLMAN ([id number]) [id number] Page 4 of 8 -------------------------------------------------------------------------------- Problem List Details Patient Name: Date of Service: Mark RDY, PA UL E. 07/08/2023 9:00 A M Medical Record Number: [id number] Patient Account Number: [id number] Date of Birth/Sex: Treating RN: [birth year] (88 y.o. M) Primary Care Provider: Kerby Nora Other Clinician: Referring Provider: Treating Provider/Extender: Myrle Sheng, Amy Weeks in Treatment: 0 Active Problems ICD-10 Encounter Code Description Active Date MDM Diagnosis I87.311 Chronic venous hypertension (idiopathic) with ulcer of right lower extremity 07/08/2023 No Yes L97.818 Non-pressure chronic ulcer of other part of right  lower leg with other specified 07/08/2023 No Yes severity E11.622 Type 2 diabetes mellitus with other skin ulcer 07/08/2023 No Yes Inactive Problems Resolved Problems Electronic Signature(s) Signed: 07/09/2023 11:51:18 AM By: Baltazar Najjar MD Entered By: Baltazar Najjar on 07/08/2023 06:43:23 -------------------------------------------------------------------------------- Progress Note Details Patient Name: Date of Service: Mark RDY, PA UL E. 07/08/2023 9:00 A M Medical Record Number: [id number] Patient Account Number: [id number] Date of Birth/Sex: Treating RN: 1935/03/15 (88 y.o. M) Primary Care Provider: Kerby Nora Other Clinician: Referring Provider: Treating Provider/Extender: Myrle Sheng, Amy Weeks in Treatment: 0 Subjective Chief Complaint Information obtained from Patient 07/08/2023; patient is here for review of a wound on the right lower leg History of Present Illness (HPI) ADMISSION 07/08/2023 This is an 88 year old man who arrived accompanied by his sister and niece. He has had a wound on the right lower leg for about the last month  perhaps more. He was seen in the ER on 06/28/2023. It was noted that he did not have a history of injury to the area but does that have a history of bilateral leg swelling. He was given doxycycline for 7 days. He saw his primary doctor on 12/17 the doxycycline was extended to 10 days. They have been using topical Neosporin gauze and Coban. Things have been improving DEVARIS, GILLOTT ([id number]) [phone].pdf Page 5 of 8 Past medical history includes diet-controlled type 2 diabetes, thoracic aortic aneurysm, alcohol induced cirrhosis, coronary artery disease, esophageal varices, chronic kidney disease, gout and macular degeneration. The patient lives with his sister who is present ABI in the right was 1.13 in our clinic Patient History Allergies iohexol Family History Cancer - Siblings, Heart Disease - Father. Social History Never smoker, Marital Status - Divorced, Alcohol Use - Never, Drug Use - No History, Caffeine Use - Never. Medical History Cardiovascular Patient has history of Coronary Artery Disease, Hypertension Gastrointestinal Patient has history of Cirrhosis Endocrine Patient has history of Type II Diabetes Musculoskeletal Patient has history of Gout Patient is treated with Controlled Diet. Blood sugar is not tested. Hospitalization/Surgery History - appendectomy. - 2002 parietal colectomy. - 2003 colostomy reversal. Medical A Surgical History Notes nd Constitutional Symptoms (General Health) vitamin b12 Eyes macular degeneration Cardiovascular Aneurysm, thoracic aortic Oncologic colon Ca Colostomy reversal 2003 Objective Constitutional Sitting or standing Blood Pressure is within target range for patient.. Pulse regular and within target range for patient.Marland Kitchen Respirations regular, non-labored and within target range.. Temperature is normal and within the target range for the patient.Marland Kitchen Appears in no distress. Vitals Time Taken: 9:12 AM, Height:  74 in, Source: Stated, Weight: 183 lbs, Source: Stated, BMI: 23.5, Temperature: 97.9 F, Pulse: 77 bpm, Respiratory Rate: 20 breaths/min, Blood Pressure: 126/71 mmHg. Cardiovascular No signs of CHF. Pedal pulses palpable on the right. 2+ pitting edema in the bilateral lower extremities. In the mid aspect of the right leg he has hemosiderin deposition this is in the area of the wound in question. General Notes: Wound exam; on the right medial lower leg the wound is present. This has no depth epithelializing. Looks a lot better than the ER picture from 2 weeks ago. There is no evidence of surrounding infection Integumentary (Hair, Skin) Wound #1 status is Open. Original cause of wound was Gradually Appeared. The date acquired was: 06/14/2023. The wound is located on the Right,Posterior Lower Leg. The wound measures 2.5cm length x 2cm width x 0.1cm depth; 3.927cm^2 area and 0.393cm^3 volume. There is Fat Layer (Subcutaneous Tissue) exposed. There is no tunneling or undermining  noted. There is a medium amount of serosanguineous drainage noted. The wound margin is distinct with the outline attached to the wound base. There is large (67-100%) red, pink granulation within the wound bed. There is no necrotic tissue within the wound bed. The periwound skin appearance exhibited: Hemosiderin Staining. The periwound skin appearance did not exhibit: Callus, Crepitus, Excoriation, Induration, Rash, Scarring, Dry/Scaly, Maceration, Atrophie Blanche, Cyanosis, Ecchymosis, Mottled, Pallor, Rubor, Erythema. Periwound temperature was noted as No Abnormality. Assessment Active Problems ICD-10 Chronic venous hypertension (idiopathic) with ulcer of right lower extremity Non-pressure chronic ulcer of other part of right lower leg with other specified severity Type 2 diabetes mellitus with other skin ulcer AAKASH, KOSIOR ([id number]) [phone].pdf Page 6 of 8 Procedures Wound #1 Pre-procedure  diagnosis of Wound #1 is a Venous Leg Ulcer located on the Right,Posterior Lower Leg .Severity of Tissue Pre Debridement is: Fat layer exposed. There was a Chemical/Enzymatic/Mechanical debridement performed by Shawn Stall, RN.. Other agent used was wound cleanser and gauze. There was no bleeding. The procedure was tolerated well. Post Debridement Measurements: 2.5cm length x 2cm width x 0.1cm depth; 0.393cm^3 volume. Character of Wound/Ulcer Post Debridement is improved. Severity of Tissue Post Debridement is: Fat layer exposed. Post procedure Diagnosis Wound #1: Same as Pre-Procedure Plan Follow-up Appointments: Return Appointment in 2 weeks. - Dr Mikey Bussing 2nd week of January 2025 (front office to schedule) Other: - [employer]. Anesthetic: (In clinic) Topical Lidocaine 4% applied to wound bed Bathing/ Shower/ Hygiene: May shower and wash wound with soap and water. - with dressing changes. Edema Control - Orders / Instructions: Elevate legs to the level of the heart or above for 30 minutes daily and/or when sitting for 3-4 times a day throughout the day. Avoid standing for long periods of time. Exercise regularly Moisturize legs daily. The following medication(s) was prescribed: lidocaine topical 4 % cream small amount of cream topically for applied only in clinic for debridement by provider. was prescribed at facility WOUND #1: - Lower Leg Wound Laterality: Right, Posterior Cleanser: Soap and Water Every Other Day/15 Days Discharge Instructions: May shower and wash wound with dial antibacterial soap and water prior to dressing change. Cleanser: [employer] Ancillary Kit - 15 Day Supply (DME) (Generic) Every Other Day/15 Days Discharge Instructions: Use supplies as instructed; Kit contains: (15) Saline Bullets; (15) 3x3 Gauze; 15 pr Gloves Peri-Wound Care: Skin Prep (DME) (Generic) Every Other Day/15 Days Discharge Instructions: Use skin prep as directed Prim Dressing: Hydrofera Blue  Ready Transfer Foam, 2.5x2.5 (in/in) (DME) (Generic) Every Other Day/15 Days ary Discharge Instructions: Apply directly to wound bed as directed Secondary Dressing: Zetuvit Plus Silicone Border Dressing 4x4 (in/in) (DME) (Generic) Every Other Day/15 Days Discharge Instructions: Apply silicone border over primary dressing as directed. Com pression Wrap: tubigrip size E double layer Every Other Day/15 Days Discharge Instructions: apply in the morning and remove at night. 1. Superficial, improving wound on the right medial lower leg secondary to chronic venous insufficiency. This could have been incidental trauma or may have been a blister he did not notice. It would not be impossible to have wounds happen spontaneously in the setting 2. The patient has been prescribed stockings in the past but he finds them too tight and has difficulty getting them on. 3. We put Hydrofera Blue on the wound and gave him double layer Tubigrip I think this will probably be healed. We counseled to keep his legs elevated at home. 4. I did not put him in a compression  wrap but if this deteriorates or does not heal we will do so 5. No evidence of infection, no need for antibiotics there was nothing to culture here. Electronic Signature(s) Signed: 07/09/2023 11:51:18 AM By: Baltazar Najjar MD Entered By: Baltazar Najjar on 07/08/2023 06:49:58 -------------------------------------------------------------------------------- HxROS Details Patient Name: Date of Service: Mark RDY, PA UL E. 07/08/2023 9:00 A M Medical Record Number: [id number] Patient Account Number: [id number] Date of Birth/Sex: Treating RN: [birth year] (88 y.o. Tammy Sours Primary Care Provider: Kerby Nora Other Clinician: Referring Provider: Treating Provider/Extender: Myrle Sheng, Amy Weeks in Treatment: [address] JILL, ADEN ([id number]) [id number] Page 7 of 8 Constitutional Symptoms (General Health) Medical  History: Past Medical History Notes: vitamin b12 Eyes Medical History: Past Medical History Notes: macular degeneration Cardiovascular Medical History: Positive for: Coronary Artery Disease; Hypertension Past Medical History Notes: Aneurysm, thoracic aortic Gastrointestinal Medical History: Positive for: Cirrhosis Endocrine Medical History: Positive for: Type II Diabetes Time with diabetes: 20 years Treated with: Diet Blood sugar tested every day: No Musculoskeletal Medical History: Positive for: Gout Oncologic Medical History: Past Medical History Notes: colon Ca Colostomy reversal 2003 Immunizations Pneumococcal Vaccine: Received Pneumococcal Vaccination: Yes Received Pneumococcal Vaccination On or After 60th Birthday: Yes Implantable Devices No devices added Hospitalization / Surgery History Type of Hospitalization/Surgery appendectomy 2002 parietal colectomy 2003 colostomy reversal Family and Social History Cancer: Yes - Siblings; Heart Disease: Yes - Father; Never smoker; Marital Status - Divorced; Alcohol Use: Never; Drug Use: No History; Caffeine Use: Never Social Determinants of Health (SDOH) 1. In the past 2 months, did you or others you live with eat smaller meals or skip meals because you didn't have money for foodo : No 2. Are you homeless or worried that you might be in the futureo : No 3. Do you have trouble paying for your utilities (gas, electricity, phone)o : No 4. Do you have trouble finding or paying for a rideo : No 5. Do you need daycare, or better daycare, for your kidso : No 6. Are you unemployed or without regular incomeo : No 7. Do you need help finding a better jobo : No 8. Do you need help getting more educationo : No 9. Are you concerned about someone in your home using drugs or alcoholo : No 10. Do you feel unsafe in your daily lifeo : No 11. Is anyone in your home threatening or abusing youo : No 12. Do you lack quality  relationships that make you feel valued and supportedo : No 13. Do you need help getting cultural information in a language you understando : No 14. Do you need help getting internet accesso : No Advanced Directives and Instructions JAYTON, RAUTH ([id number]) [phone].pdf Page 8 of 8 Spiritual or Cultural beliefs preclude asking about Advance Care Planning: No Advanced Directives: Yes; PCP at [facility] health has a copy. Copy Provided: No Living Will: Yes Copy Provided: No Medical Power of Attorney: Yes; sister and niece Copy Provided: No Electronic Signature(s) Signed: 07/08/2023 4:50:55 PM By: Shawn Stall RN, BSN Signed: 07/09/2023 11:51:18 AM By: Baltazar Najjar MD Entered By: Shawn Stall on 07/08/2023 06:23:55 -------------------------------------------------------------------------------- SuperBill Details Patient Name: Date of Service: Mark RDY, PA UL E. 07/08/2023 Medical Record Number: [id number] Patient Account Number: [id number] Date of Birth/Sex: Treating RN: 09-11-1934 (88 y.o. Tammy Sours Primary Care Provider: Kerby Nora Other Clinician: Referring Provider: Treating Provider/Extender: Myrle Sheng, Amy Weeks in Treatment: 0 Diagnosis Coding ICD-10 Codes Code Description I87.311 Chronic venous hypertension (idiopathic) with ulcer  of right lower extremity L97.818 Non-pressure chronic ulcer of other part of right lower leg with other specified severity E11.622 Type 2 diabetes mellitus with other skin ulcer Facility Procedures : CPT4 Code: [id number] Description: 99214 - WOUND CARE VISIT-LEV 4 EST PT Modifier: Quantity: 1 Physician Procedures : CPT4 Code Description Modifier [id number] WC PHYS LEVEL 3 NEW PT ICD-10 Diagnosis Description I87.311 Chronic venous hypertension (idiopathic) with ulcer of right lower extremity L97.818 Non-pressure chronic ulcer of other part of right lower leg with  other specified severity E11.622 Type 2  diabetes mellitus with other skin ulcer Quantity: 1 Electronic Signature(s) Signed: 07/09/2023 11:51:18 AM By: Baltazar Najjar MD Entered By: Baltazar Najjar on 07/08/2023 06:50:30

## 2023-07-09 NOTE — Progress Notes (Signed)
Mark Le ([id number]) [id number] Page 1 of 10 Visit Report for 07/08/2023 Allergy List Details Patient Name: Date of Service: Mark Le, [place] UL E. 07/08/2023 9:00 A M Medical Record Number: [id number] Patient Account Number: [id number] Date of Birth/Sex: Treating RN: 11/26/1934 (88 y.o. Mark Le Primary Care Mark Le: Mark Le Other Clinician: Referring Mark Le: Treating Mark Le/Extender: Mark Le, Mark Le in Treatment: 0 Allergies Active Allergies iohexol Allergy Notes Electronic Signature(s) Signed: 07/08/2023 4:50:55 PM By: Mark Stall RN, BSN Entered By: Mark Le on 07/08/2023 05:42:30 -------------------------------------------------------------------------------- Arrival Information Details Patient Name: Date of Service: Mark RDY, Mark UL E. 07/08/2023 9:00 A M Medical Record Number: [id number] Patient Account Number: [id number] Date of Birth/Sex: Treating RN: [birth year] (88 y.o. Mark Le Primary Care Dima Ferrufino: Mark Le Other Clinician: Referring Mark Le: Treating Mark Le/Extender: Mark Le, Mark Le in Treatment: 0 Visit Information Patient Arrived: Ambulatory Arrival Time: 09:22 Accompanied By: sister and niece Transfer Assistance: None Patient Identification Verified: Yes Secondary Verification Process Completed: Yes Patient Requires Transmission-Based Precautions: No Patient Has Alerts: No Electronic Signature(s) Signed: 07/08/2023 4:50:55 PM By: Mark Stall RN, BSN Entered By: Mark Le on 07/08/2023 06:22:16 -------------------------------------------------------------------------------- Clinic Level of Care Assessment Details Patient Name: Date of Service: Mark RDY, Mark UL E. 07/08/2023 9:00 A M Medical Record Number: [id number] Patient Account Number: [id number] Mark Le ([id number]) [phone].pdf Page 2 of 10 Date of Birth/Sex: Treating  RN: [birth year] (88 y.o. Mark Le Primary Care Linna Thebeau: Other Clinician: Kerby Le Referring Ezinne Yogi: Treating Mark Le/Extender: Mark Le, Mark Le in Treatment: 0 Clinic Level of Care Assessment Items TOOL 2 Quantity Score X- 1 0 Use when only an EandM is performed on the INITIAL visit ASSESSMENTS - Nursing Assessment / Reassessment X- 1 20 General Physical Exam (combine w/ comprehensive assessment (listed just below) when performed on new pt. evals) X- 1 25 Comprehensive Assessment (HX, ROS, Risk Assessments, Wounds Hx, etc.) ASSESSMENTS - Wound and Skin A ssessment / Reassessment X - Simple Wound Assessment / Reassessment - one wound 1 5 []  - 0 Complex Wound Assessment / Reassessment - multiple wounds X- 1 10 Dermatologic / Skin Assessment (not related to wound area) ASSESSMENTS - Ostomy and/or Continence Assessment and Care []  - 0 Incontinence Assessment and Management []  - 0 Ostomy Care Assessment and Management (repouching, etc.) PROCESS - Coordination of Care X - Simple Patient / Family Education for ongoing care 1 15 []  - 0 Complex (extensive) Patient / Family Education for ongoing care X- 1 10 Staff obtains Chiropractor, Records, T Results / Process Orders est []  - 0 Staff telephones HHA, Nursing Homes / Clarify orders / etc []  - 0 Routine Transfer to another Facility (non-emergent condition) []  - 0 Routine Hospital Admission (non-emergent condition) []  - 0 New Admissions / Manufacturing engineer / Ordering NPWT Apligraf, etc. , []  - 0 Emergency Hospital Admission (emergent condition) X- 1 10 Simple Discharge Coordination []  - 0 Complex (extensive) Discharge Coordination PROCESS - Special Needs []  - 0 Pediatric / Minor Patient Management []  - 0 Isolation Patient Management []  - 0 Hearing / Language / Visual special needs []  - 0 Assessment of Community assistance (transportation, D/C planning, etc.) []  - 0 Additional  assistance / Altered mentation []  - 0 Support Surface(s) Assessment (bed, cushion, seat, etc.) INTERVENTIONS - Wound Cleansing / Measurement X- 1 5 Wound Imaging (photographs - any number of wounds) []  - 0 Wound Tracing (instead of photographs) X- 1 5 Simple Wound Measurement - one wound []  -  0 Complex Wound Measurement - multiple wounds X- 1 5 Simple Wound Cleansing - one wound []  - 0 Complex Wound Cleansing - multiple wounds INTERVENTIONS - Wound Dressings X - Small Wound Dressing one or multiple wounds 1 10 []  - 0 Medium Wound Dressing one or multiple wounds []  - 0 Large Wound Dressing one or multiple wounds []  - 0 Application of Medications - injection [name], [name] ([id number]) [phone].pdf Page 3 of 10 INTERVENTIONS - Miscellaneous []  - 0 External ear exam []  - 0 Specimen Collection (cultures, biopsies, blood, body fluids, etc.) []  - 0 Specimen(s) / Culture(s) sent or taken to Lab for analysis []  - 0 Patient Transfer (multiple staff / Michiel Sites Lift / Similar devices) []  - 0 Simple Staple / Suture removal (25 or less) []  - 0 Complex Staple / Suture removal (26 or more) []  - 0 Hypo / Hyperglycemic Management (close monitor of Blood Glucose) X- 1 15 Ankle / Brachial Index (ABI) - do not check if billed separately Has the patient been seen at the hospital within the last three years: Yes Total Score: 135 Level Of Care: New/Established - Level 4 Electronic Signature(s) Signed: 07/08/2023 4:50:55 PM By: Mark Stall RN, BSN Entered By: Mark Le on 07/08/2023 06:36:00 -------------------------------------------------------------------------------- Encounter Discharge Information Details Patient Name: Date of Service: Mark RDY, Mark UL E. 07/08/2023 9:00 A M Medical Record Number: [id number] Patient Account Number: [id number] Date of Birth/Sex: Treating RN: 1934-07-20 (88 y.o. Mark Le Primary Care Dyshaun Bonzo: Mark Le Other  Clinician: Referring Chael Urenda: Treating Mark Le/Extender: Mark Le, Mark Le in Treatment: 0 Encounter Discharge Information Items Post Procedure Vitals Discharge Condition: Stable Temperature (F): 97.9 Ambulatory Status: Ambulatory Pulse (bpm): 77 Discharge Destination: Home Respiratory Rate (breaths/min): 20 Transportation: Private Auto Blood Pressure (mmHg): 126/71 Accompanied By: sister and niece Schedule Follow-up Appointment: Yes Clinical Summary of Care: Electronic Signature(s) Signed: 07/08/2023 4:50:55 PM By: Mark Stall RN, BSN Entered By: Mark Le on 07/08/2023 06:36:54 -------------------------------------------------------------------------------- Lower Extremity Assessment Details Patient Name: Date of Service: Mark RDY, Mark UL E. 07/08/2023 9:00 A M Medical Record Number: [id number] Patient Account Number: [id number] Date of Birth/Sex: Treating RN: 03/09/1935 (88 y.o. Mark Le Primary Care Charnita Trudel: Mark Le Other Clinician: Referring Cybill Uriegas: Treating Merced Brougham/Extender: Mark Le, Mark Le in Treatment: 0 Edema Assessment Assessed: [Left: No] [Right: Yes] Edema: [Left: Ye] [Right: s] H[LeftLAJON, STETZER ([id number])] [Right: [id number] Page 4 of 10] Calf Left: Right: Point of Measurement: 34 cm From Medial Instep 33 cm Ankle Left: Right: Point of Measurement: 12 cm From Medial Instep 23 cm Knee To Floor Left: Right: From Medial Instep 46 cm Vascular Assessment Pulses: Dorsalis Pedis Palpable: [Right:Yes] Doppler Audible: [Right:Yes] Posterior Tibial Palpable: [Right:Yes] Doppler Audible: [Right:Yes] Extremity colors, hair growth, and conditions: Extremity Color: [Right:Normal] Hair Growth on Extremity: [Right:No] Temperature of Extremity: [Right:Warm] Capillary Refill: [Right:< 3 seconds] Dependent Rubor: [Right:No] Blanched when Elevated:  [Right:No] Lipodermatosclerosis: [Right:Yes] Blood Pressure: Brachial: [Right:126] Ankle: [Right:Dorsalis Pedis: 142 1.13] Toe Nail Assessment Left: Right: Thick: Yes Discolored: Yes Deformed: Yes Improper Length and Hygiene: No Electronic Signature(s) Signed: 07/08/2023 4:50:55 PM By: Mark Stall RN, BSN Entered By: Mark Le on 07/08/2023 06:21:56 -------------------------------------------------------------------------------- Multi Wound Chart Details Patient Name: Date of Service: Mark RDY, Mark UL E. 07/08/2023 9:00 A M Medical Record Number: [id number] Patient Account Number: [id number] Date of Birth/Sex: Treating RN: [birth year] (88 y.o. M) Primary Care Raechell Singleton: Mark Le Other Clinician: Referring Devinn Voshell: Treating Salaam Battershell/Extender: Mark Le, Mark Le in Treatment: 0  Vital Signs Height(in): 74 Pulse(bpm): 77 Weight(lbs): 183 Blood Pressure(mmHg): 126/71 Body Mass Index(BMI): 23.5 Temperature(F): 97.9 Respiratory Rate(breaths/min): 20 [1:Photos:] Mark Le, ROTHWELL ([id number]) [1:Photos:] [N/A:N/A] Right, Posterior Lower Leg N/A N/A Wound Location: Gradually Appeared N/A N/A Wounding Event: Venous Leg Ulcer N/A N/A Primary Etiology: Coronary Artery Disease, N/A N/A Comorbid History: Hypertension, Cirrhosis , Type II Diabetes, Gout 06/14/2023 N/A N/A Date Acquired: 0 N/A N/A Le of Treatment: Open N/A N/A Wound Status: No N/A N/A Wound Recurrence: 2.5x2x0.1 N/A N/A Measurements L x W x D (cm) 3.927 N/A N/A A (cm) : rea 0.393 N/A N/A Volume (cm) : Full Thickness Without Exposed N/A N/A Classification: Support Structures Medium N/A N/A Exudate A mount: Serosanguineous N/A N/A Exudate Type: red, brown N/A N/A Exudate Color: Distinct, outline attached N/A N/A Wound Margin: Large (67-100%) N/A N/A Granulation A mount: Red, Pink N/A N/A Granulation Quality: None Present (0%) N/A N/A Necrotic A mount: Fat Layer  (Subcutaneous Tissue): Yes N/A N/A Exposed Structures: Fascia: No Tendon: No Muscle: No Joint: No Bone: No Medium (34-66%) N/A N/A Epithelialization: Chemical/Enzymatic/Mechanical N/A N/A Debridement: N/A N/A N/A Instrument: None N/A N/A Bleeding: Debridement Treatment Response: Procedure was tolerated well N/A N/A Post Debridement Measurements L x 2.5x2x0.1 N/A N/A W x D (cm) 0.393 N/A N/A Post Debridement Volume: (cm) Excoriation: No N/A N/A Periwound Skin Texture: Induration: No Callus: No Crepitus: No Rash: No Scarring: No Maceration: No N/A N/A Periwound Skin Moisture: Dry/Scaly: No Hemosiderin Staining: Yes N/A N/A Periwound Skin Color: Atrophie Blanche: No Cyanosis: No Ecchymosis: No Erythema: No Mottled: No Pallor: No Rubor: No No Abnormality N/A N/A Temperature: Debridement N/A N/A Procedures Performed: Treatment Notes Wound #1 (Lower Leg) Wound Laterality: Right, Posterior Cleanser Soap and Water Discharge Instruction: May shower and wash wound with dial antibacterial soap and water prior to dressing change. Byram Ancillary Kit - 15 Day Supply Discharge Instruction: Use supplies as instructed; Kit contains: (15) Saline Bullets; (15) 3x3 Gauze; 15 pr Gloves Peri-Wound Care Skin Prep Discharge Instruction: Use skin prep as directed Topical Primary Dressing Hydrofera Blue Ready Transfer Foam, 2.5x2.5 (in/in) Discharge Instruction: Apply directly to wound bed as directed RONDEL, KEATING ([id number]) [phone].pdf Page 6 of 10 Secondary Dressing Zetuvit Plus Silicone Border Dressing 4x4 (in/in) Discharge Instruction: Apply silicone border over primary dressing as directed. Secured With Compression Wrap tubigrip size E double layer Discharge Instruction: apply in the morning and remove at night. Compression Stockings Add-Ons Electronic Signature(s) Signed: 07/09/2023 11:51:18 AM By: Mark Najjar MD Entered By: Mark Le on 07/08/2023 06:43:29 -------------------------------------------------------------------------------- Multi-Disciplinary Care Plan Details Patient Name: Date of Service: Mark Meyer, Mark UL E. 07/08/2023 9:00 A M Medical Record Number: [id number] Patient Account Number: [id number] Date of Birth/Sex: Treating RN: 27-Jan-1935 (88 y.o. Mark Le Primary Care Robyne Matar: Mark Le Other Clinician: Referring Deaken Jurgens: Treating Stellan Vick/Extender: Mark Le, Mark Le in Treatment: 0 Active Inactive Orientation to the Wound Care Program Nursing Diagnoses: Knowledge deficit related to the wound healing center program Goals: Patient/caregiver will verbalize understanding of the Wound Healing Center Program Date Initiated: 07/08/2023 Target Resolution Date: 07/19/2023 Goal Status: Active Interventions: Provide education on orientation to the wound center Notes: Venous Leg Ulcer Nursing Diagnoses: Potential for venous Insuffiency (use before diagnosis confirmed) Goals: Patient will maintain optimal edema control Date Initiated: 07/08/2023 Target Resolution Date: 07/19/2023 Goal Status: Active Interventions: Assess peripheral edema status every visit. Provide education on venous insufficiency Treatment Activities: Therapeutic compression applied : 07/08/2023 Notes: Wound/Skin Impairment KORAY, ZAPPONE ([id number])  [id number] Page 7 of 10 Nursing Diagnoses: Knowledge deficit related to ulceration/compromised skin integrity Goals: Patient/caregiver will verbalize understanding of skin care regimen Date Initiated: 07/08/2023 Target Resolution Date: 07/19/2023 Goal Status: Active Ulcer/skin breakdown will have a volume reduction of 30% by week 4 Date Initiated: 07/08/2023 Target Resolution Date: 07/30/2023 Goal Status: Active Interventions: Assess patient/caregiver ability to obtain necessary supplies Assess patient/caregiver ability to  perform ulcer/skin care regimen upon admission and as needed Provide education on ulcer and skin care Treatment Activities: Skin care regimen initiated : 07/08/2023 Topical wound management initiated : 07/08/2023 Notes: Electronic Signature(s) Signed: 07/08/2023 4:50:55 PM By: Mark Stall RN, BSN Entered By: Mark Le on 07/08/2023 06:30:17 -------------------------------------------------------------------------------- Pain Assessment Details Patient Name: Date of Service: Mark RDY, Mark UL E. 07/08/2023 9:00 A M Medical Record Number: [id number] Patient Account Number: [id number] Date of Birth/Sex: Treating RN: 1934/07/31 (88 y.o. Mark Le Primary Care Jacqulin Brandenburger: Mark Le Other Clinician: Referring Bobbye Reinitz: Treating Rasheena Talmadge/Extender: Mark Le, Mark Le in Treatment: 0 Active Problems Location of Pain Severity and Description of Pain Patient Has Paino No Site Locations Pain Management and Medication Current Pain Management: Electronic Signature(s) Signed: 07/08/2023 4:50:55 PM By: Mark Stall RN, BSN Entered By: Mark Le on 07/08/2023 06:15:25 Fransisco Beau ([id number]) [id number] Page 8 of 10 -------------------------------------------------------------------------------- Patient/Caregiver Education Details Patient Name: Date of Service: Mark Le, [place] UL E. 12/23/2024andnbsp9:00 A M Medical Record Number: [id number] Patient Account Number: [id number] Date of Birth/Gender: Treating RN: August 04, 1934 (88 y.o. Mark Le Primary Care Physician: Mark Le Other Clinician: Referring Physician: Treating Physician/Extender: Mark Le, Mark Le in Treatment: 0 Education Assessment Education Provided To: Patient Education Topics Provided Welcome [facility] Packet: o Handouts: Welcome [facility] o Methods: Explain/Verbal, Printed Responses: Reinforcements  needed Electronic Signature(s) Signed: 07/08/2023 4:50:55 PM By: Mark Stall RN, BSN Entered By: Mark Le on 07/08/2023 06:30:31 -------------------------------------------------------------------------------- Wound Assessment Details Patient Name: Date of Service: Mark RDY, Mark UL E. 07/08/2023 9:00 A M Medical Record Number: [id number] Patient Account Number: [id number] Date of Birth/Sex: Treating RN: Jul 15, 1935 (88 y.o. Mark Le Primary Care Hussam Muniz: Mark Le Other Clinician: Referring Daejon Lich: Treating Daija Routson/Extender: Mark Le, Mark Le in Treatment: 0 Wound Status Wound Number: 1 Primary Venous Leg Ulcer Etiology: Wound Location: Right, Posterior Lower Leg Wound Status: Open Wounding Event: Gradually Appeared Comorbid Coronary Artery Disease, Hypertension, Cirrhosis , Type II Date Acquired: 06/14/2023 History: Diabetes, Gout Le Of Treatment: 0 Clustered Wound: No Photos Wound Measurements JULUS, RANCOURT ([id number]) Length: (cm) 2.5 Width: (cm) 2 Depth: (cm) 0.1 Area: (cm) 3.927 Volume: (cm) 0.393 [id number] Page 9 of 10 % Reduction in Area: % Reduction in Volume: Epithelialization: Medium (34-66%) Tunneling: No Undermining: No Wound Description Classification: Full Thickness Without Exposed Support Structures Wound Margin: Distinct, outline attached Exudate Amount: Medium Exudate Type: Serosanguineous Exudate Color: red, brown Foul Odor After Cleansing: No Slough/Fibrino No Wound Bed Granulation Amount: Large (67-100%) Exposed Structure Granulation Quality: Red, Pink Fascia Exposed: No Necrotic Amount: None Present (0%) Fat Layer (Subcutaneous Tissue) Exposed: Yes Tendon Exposed: No Muscle Exposed: No Joint Exposed: No Bone Exposed: No Periwound Skin Texture Texture Color No Abnormalities Noted: No No Abnormalities Noted: No Callus: No Atrophie Blanche: No Crepitus: No Cyanosis:  No Excoriation: No Ecchymosis: No Induration: No Erythema: No Rash: No Hemosiderin Staining: Yes Scarring: No Mottled: No Pallor: No Moisture Rubor: No No Abnormalities Noted: No Dry / Scaly: No Temperature / Pain Maceration: No Temperature:  No Abnormality Treatment Notes Wound #1 (Lower Leg) Wound Laterality: Right, Posterior Cleanser Soap and Water Discharge Instruction: May shower and wash wound with dial antibacterial soap and water prior to dressing change. Byram Ancillary Kit - 15 Day Supply Discharge Instruction: Use supplies as instructed; Kit contains: (15) Saline Bullets; (15) 3x3 Gauze; 15 pr Gloves Peri-Wound Care Skin Prep Discharge Instruction: Use skin prep as directed Topical Primary Dressing Hydrofera Blue Ready Transfer Foam, 2.5x2.5 (in/in) Discharge Instruction: Apply directly to wound bed as directed Secondary Dressing Zetuvit Plus Silicone Border Dressing 4x4 (in/in) Discharge Instruction: Apply silicone border over primary dressing as directed. Secured With Compression Wrap tubigrip size E double layer Discharge Instruction: apply in the morning and remove at night. Compression Stockings Add-Ons Electronic Signature(s) Signed: 07/08/2023 4:50:55 PM By: Mark Stall RN, BSN Entered By: Mark Le on 07/08/2023 06:25:34 Fransisco Beau ([id number]) [id number] Page 10 of 10 -------------------------------------------------------------------------------- Vitals Details Patient Name: Date of Service: Mark RDY, Mark UL E. 07/08/2023 9:00 A M Medical Record Number: [id number] Patient Account Number: [id number] Date of Birth/Sex: Treating RN: 07/13/1935 (88 y.o. Mark Le Primary Care Undrea Archbold: Mark Le Other Clinician: Referring Suheily Birks: Treating Jannely Henthorn/Extender: Mark Le, Mark Le in Treatment: 0 Vital Signs Time Taken: 09:12 Temperature (F): 97.9 Height (in): 74 Pulse (bpm): 77 Source:  Stated Respiratory Rate (breaths/min): 20 Weight (lbs): 183 Blood Pressure (mmHg): 126/71 Source: Stated Reference Range: 80 - 120 mg / dl Body Mass Index (BMI): 23.5 Electronic Signature(s) Signed: 07/08/2023 4:50:55 PM By: Mark Stall RN, BSN Entered By: Mark Le on 07/08/2023 06:12:39

## 2023-07-18 DIAGNOSIS — S81801A Unspecified open wound, right lower leg, initial encounter: Secondary | ICD-10-CM | POA: Diagnosis not present

## 2023-07-22 ENCOUNTER — Ambulatory Visit (HOSPITAL_BASED_OUTPATIENT_CLINIC_OR_DEPARTMENT_OTHER): Payer: Medicare Other | Admitting: Internal Medicine

## 2023-07-25 ENCOUNTER — Telehealth: Payer: Self-pay | Admitting: *Deleted

## 2023-07-25 DIAGNOSIS — E1169 Type 2 diabetes mellitus with other specified complication: Secondary | ICD-10-CM

## 2023-07-25 DIAGNOSIS — E781 Pure hyperglyceridemia: Secondary | ICD-10-CM

## 2023-07-25 NOTE — Telephone Encounter (Signed)
-----   Message from Alvina Chou sent at 07/25/2023  3:07 PM EST ----- Regarding: Lab orders for Fri, 1.31.25 Lab orders for a 6 month follow up appt

## 2023-07-29 ENCOUNTER — Encounter (HOSPITAL_BASED_OUTPATIENT_CLINIC_OR_DEPARTMENT_OTHER): Payer: Medicare Other | Attending: Internal Medicine | Admitting: Internal Medicine

## 2023-07-29 DIAGNOSIS — L97818 Non-pressure chronic ulcer of other part of right lower leg with other specified severity: Secondary | ICD-10-CM | POA: Insufficient documentation

## 2023-07-29 DIAGNOSIS — E1122 Type 2 diabetes mellitus with diabetic chronic kidney disease: Secondary | ICD-10-CM | POA: Insufficient documentation

## 2023-07-29 DIAGNOSIS — I87311 Chronic venous hypertension (idiopathic) with ulcer of right lower extremity: Secondary | ICD-10-CM | POA: Diagnosis not present

## 2023-07-29 DIAGNOSIS — I129 Hypertensive chronic kidney disease with stage 1 through stage 4 chronic kidney disease, or unspecified chronic kidney disease: Secondary | ICD-10-CM | POA: Diagnosis not present

## 2023-07-29 DIAGNOSIS — K7030 Alcoholic cirrhosis of liver without ascites: Secondary | ICD-10-CM | POA: Insufficient documentation

## 2023-07-29 DIAGNOSIS — N189 Chronic kidney disease, unspecified: Secondary | ICD-10-CM | POA: Insufficient documentation

## 2023-07-29 DIAGNOSIS — E11622 Type 2 diabetes mellitus with other skin ulcer: Secondary | ICD-10-CM | POA: Diagnosis not present

## 2023-07-29 DIAGNOSIS — I2510 Atherosclerotic heart disease of native coronary artery without angina pectoris: Secondary | ICD-10-CM | POA: Insufficient documentation

## 2023-07-31 NOTE — Progress Notes (Signed)
 Ervine, Laurence E ([id number]) [id number] Page 1 of 8 Visit Report for 07/29/2023 Arrival Information Details Patient Name: Date of Service: Mark Le, Mark UL E. 07/29/2023 1:15 PM Medical Record Number: [id number] Patient Account Number: [id number] Date of Birth/Sex: Treating RN: 12-10-1934 (89 y.o. M) Primary Care Shneur [place]: Avelina No Other Clinician: Referring Rajat Staver: Treating Concepcion Gillott/Extender: Rosan Harlene Avelina, Amy Weeks in Treatment: 3 Visit Information History Since Last Visit Added or deleted any medications: No Patient Arrived: Ambulatory Any new allergies or adverse reactions: No Arrival Time: 13:42 Had a fall or experienced change in No Accompanied By: family activities of daily living that may affect Transfer Assistance: None risk of falls: Patient Identification Verified: Yes Signs or symptoms of abuse/neglect since last visito No Secondary Verification Process Completed: Yes Hospitalized since last visit: No Patient Requires Transmission-Based Precautions: No Implantable device outside of the clinic excluding No Patient Has Alerts: No cellular tissue based products placed in the center since last visit: Has Dressing in Place as Prescribed: Yes Pain Present Now: No Electronic Signature(s) Signed: 07/29/2023 5:23:50 PM By: Wyn Iha Entered By: Wyn Iha on 07/29/2023 13:47:05 -------------------------------------------------------------------------------- Clinic Level of Care Assessment Details Patient Name: Date of Service: Mark RDY, PA UL E. 07/29/2023 1:15 PM Medical Record Number: [id number] Patient Account Number: [id number] Date of Birth/Sex: Treating RN: [birth year] (89 y.o. NETTY Cammie Sailors Primary Care Ameen Mostafa: Avelina No Other Clinician: Referring Amine Adelson: Treating Brinsley Wence/Extender: Rosan Harlene Avelina, Amy Weeks in Treatment: 3 Clinic Level of Care Assessment Items TOOL 4 Quantity Score X- 1 0 Use when  only an EandM is performed on FOLLOW-UP visit ASSESSMENTS - Nursing Assessment / Reassessment X- 1 10 Reassessment of Co-morbidities (includes updates in patient status) X- 1 5 Reassessment of Adherence to Treatment Plan ASSESSMENTS - Wound and Skin A ssessment / Reassessment X - Simple Wound Assessment / Reassessment - one wound 1 5 []  - 0 Complex Wound Assessment / Reassessment - multiple wounds []  - 0 Dermatologic / Skin Assessment (not related to wound area) ASSESSMENTS - Focused Assessment X- 1 5 Circumferential Edema Measurements - multi extremities []  - 0 Nutritional Assessment / Counseling / Intervention TITAN, KARNER ([id number]) [id number] Page 2 of 8 []  - 0 Lower Extremity Assessment (monofilament, tuning fork, pulses) []  - 0 Peripheral Arterial Disease Assessment (using hand held doppler) ASSESSMENTS - Ostomy and/or Continence Assessment and Care []  - 0 Incontinence Assessment and Management []  - 0 Ostomy Care Assessment and Management (repouching, etc.) PROCESS - Coordination of Care X - Simple Patient / Family Education for ongoing care 1 15 []  - 0 Complex (extensive) Patient / Family Education for ongoing care X- 1 10 Staff obtains Chiropractor, Records, T Results / Process Orders est []  - 0 Staff telephones HHA, Nursing Homes / Clarify orders / etc []  - 0 Routine Transfer to another Facility (non-emergent condition) []  - 0 Routine Hospital Admission (non-emergent condition) []  - 0 New Admissions / Manufacturing Engineer / Ordering NPWT Apligraf, etc. , []  - 0 Emergency Hospital Admission (emergent condition) X- 1 10 Simple Discharge Coordination []  - 0 Complex (extensive) Discharge Coordination PROCESS - Special Needs []  - 0 Pediatric / Minor Patient Management []  - 0 Isolation Patient Management []  - 0 Hearing / Language / Visual special needs []  - 0 Assessment of Community assistance (transportation, D/C planning,  etc.) []  - 0 Additional assistance / Altered mentation []  - 0 Support Surface(s) Assessment (bed, cushion, seat, etc.) INTERVENTIONS - Wound Cleansing / Measurement X - Simple Wound Cleansing - one  wound 1 5 []  - 0 Complex Wound Cleansing - multiple wounds X- 1 5 Wound Imaging (photographs - any number of wounds) []  - 0 Wound Tracing (instead of photographs) X- 1 5 Simple Wound Measurement - one wound []  - 0 Complex Wound Measurement - multiple wounds INTERVENTIONS - Wound Dressings X - Small Wound Dressing one or multiple wounds 1 10 []  - 0 Medium Wound Dressing one or multiple wounds []  - 0 Large Wound Dressing one or multiple wounds []  - 0 Application of Medications - topical []  - 0 Application of Medications - injection INTERVENTIONS - Miscellaneous []  - 0 External ear exam []  - 0 Specimen Collection (cultures, biopsies, blood, body fluids, etc.) []  - 0 Specimen(s) / Culture(s) sent or taken to Lab for analysis []  - 0 Patient Transfer (multiple staff / Nurse, Adult / Similar devices) []  - 0 Simple Staple / Suture removal (25 or less) []  - 0 Complex Staple / Suture removal (26 or more) []  - 0 Hypo / Hyperglycemic Management (close monitor of Blood Glucose) ALMOND, FITZGIBBON ([id number]) [id number] Page 3 of 8 []  - 0 Ankle / Brachial Index (ABI) - do not check if billed separately X- 1 5 Vital Signs Has the patient been seen at the hospital within the last three years: Yes Total Score: 90 Level Of Care: New/Established - Level 3 Electronic Signature(s) Signed: 07/29/2023 5:06:40 PM By: Cammie Sailors RN, BSN Entered By: Cammie Sailors on 07/29/2023 14:28:26 -------------------------------------------------------------------------------- Encounter Discharge Information Details Patient Name: Date of Service: Mark RDY, PA UL E. 07/29/2023 1:15 PM Medical Record Number: [id number] Patient Account Number: [id number] Date of Birth/Sex: Treating  RN: 12-26-1934 (89 y.o. NETTY Cammie Sailors Primary Care Shikita Vaillancourt: Avelina No Other Clinician: Referring Cori Henningsen: Treating Murdock Jellison/Extender: Rosan Harlene Avelina, Amy Weeks in Treatment: 3 Encounter Discharge Information Items Discharge Condition: Stable Ambulatory Status: Ambulatory Discharge Destination: Home Transportation: Private Auto Accompanied By: wife Schedule Follow-up Appointment: Yes Clinical Summary of Care: Patient Declined Electronic Signature(s) Signed: 07/29/2023 5:06:40 PM By: Cammie Sailors RN, BSN Entered By: Cammie Sailors on 07/29/2023 14:28:59 -------------------------------------------------------------------------------- Lower Extremity Assessment Details Patient Name: Date of Service: Mark RDY, PA UL E. 07/29/2023 1:15 PM Medical Record Number: [id number] Patient Account Number: [id number] Date of Birth/Sex: Treating RN: February 15, 1935 (89 y.o. M) Primary Care Evonne Rinks: Avelina No Other Clinician: Referring Rin Gorton: Treating Donisha Hoch/Extender: Rosan Harlene Avelina, Amy Weeks in Treatment: 3 Edema Assessment Assessed: [Left: No] [Right: No] Edema: [Left: Ye] [Right: s] Calf Left: Right: Point of Measurement: 34 cm From Medial Instep 32.5 cm Ankle Left: Right: Point of Measurement: 12 cm From Medial Instep 24.7 cm Vascular Assessment LEEROY, LOVINGS ([id number]) [Right:[id number] Page 4 of 8] Extremity colors, hair growth, and conditions: Extremity Color: [Right:Normal] Hair Growth on Extremity: [Right:No] Temperature of Extremity: [Right:Warm] Capillary Refill: [Right:< 3 seconds] Dependent Rubor: [Right:No Yes] Electronic Signature(s) Signed: 07/29/2023 5:23:50 PM By: Wyn Iha Entered By: Wyn Iha on 07/29/2023 13:47:27 -------------------------------------------------------------------------------- Multi Wound Chart Details Patient Name: Date of Service: TWANA BLOOMER, PA UL E. 07/29/2023 1:15 PM Medical  Record Number: [id number] Patient Account Number: [id number] Date of Birth/Sex: Treating RN: 1934/10/28 (89 y.o. M) Primary Care Roland Lipke: Avelina No Other Clinician: Referring Klara Stjames: Treating Fatime Biswell/Extender: Rosan Harlene Avelina, Amy Weeks in Treatment: 3 Vital Signs Height(in): 74 Pulse(bpm): 65 Weight(lbs): 183 Blood Pressure(mmHg): 127/73 Body Mass Index(BMI): 23.5 Temperature(F): 97.4 Respiratory Rate(breaths/min): 18 [1:Photos:] [N/A:N/A] Right, Posterior Lower Leg N/A N/A Wound Location: Gradually Appeared N/A N/A Wounding Event: Venous Leg Ulcer N/A N/A Primary Etiology: Coronary  Artery Disease, N/A N/A Comorbid History: Hypertension, Cirrhosis , Type II Diabetes, Gout 06/14/2023 N/A N/A Date Acquired: 3 N/A N/A Weeks of Treatment: Healed - Epithelialized N/A N/A Wound Status: No N/A N/A Wound Recurrence: 0x0x0 N/A N/A Measurements L x W x D (cm) 0 N/A N/A A (cm) : rea 0 N/A N/A Volume (cm) : 100.00% N/A N/A % Reduction in Area: 100.00% N/A N/A % Reduction in Volume: Full Thickness Without Exposed N/A N/A Classification: Support Structures None Present N/A N/A Exudate Amount: Distinct, outline attached N/A N/A Wound Margin: None Present (0%) N/A N/A Granulation Amount: None Present (0%) N/A N/A Necrotic Amount: Fascia: No N/A N/A Exposed Structures: Fat Layer (Subcutaneous Tissue): No Tendon: No Muscle: No Joint: No Bone: No Large (67-100%) N/A N/A Epithelialization: Excoriation: No N/A N/A Periwound Skin Texture: Induration: No Callus: No BRENTTON, WARDLOW ([id number]) [phone] Page 5 of 8 Crepitus: No Rash: No Scarring: No Maceration: No N/A N/A Periwound Skin Moisture: Dry/Scaly: No Hemosiderin Staining: Yes N/A N/A Periwound Skin Color: Atrophie Blanche: No Cyanosis: No Ecchymosis: No Erythema: No Mottled: No Pallor: No Rubor: No No Abnormality N/A N/A Temperature: Treatment  Notes Wound #1 (Lower Leg) Wound Laterality: Right, Posterior Cleanser Peri-Wound Care Topical Primary Dressing Secondary Dressing Secured With Compression Wrap Compression Stockings Add-Ons Electronic Signature(s) Signed: 07/29/2023 5:12:23 PM By: Rosan Raisin DO Entered By: Rosan Raisin on 07/29/2023 15:42:43 -------------------------------------------------------------------------------- Multi-Disciplinary Care Plan Details Patient Name: Date of Service: Mark RDY, PA UL E. 07/29/2023 1:15 PM Medical Record Number: [id number] Patient Account Number: [id number] Date of Birth/Sex: Treating RN: 20-Jan-1935 (89 y.o. NETTY Cammie Sailors Primary Care Maniyah Moller: Avelina No Other Clinician: Referring Glorene Leitzke: Treating Lashunta Frieden/Extender: Rosan Raisin Avelina, Amy Weeks in Treatment: 3 Active Inactive Electronic Signature(s) Signed: 07/29/2023 5:06:40 PM By: Cammie Sailors RN, BSN Entered By: Cammie Sailors on 07/29/2023 14:27:21 -------------------------------------------------------------------------------- Pain Assessment Details Patient Name: Date of Service: Mark RDY, PA UL E. 07/29/2023 1:15 PM Medical Record Number: [id number] Patient Account Number: [id number] CHRISEAN, KLOTH ([id number]) [id number] Page 6 of 8 Date of Birth/Sex: Treating RN: Mar 03, 1935 (89 y.o. M) Primary Care Ericha Whittingham: Other Clinician: Avelina No Referring Gordy Goar: Treating Samah Lapiana/Extender: Rosan Raisin Avelina, Amy Weeks in Treatment: 3 Active Problems Location of Pain Severity and Description of Pain Patient Has Paino No Site Locations Pain Management and Medication Current Pain Management: Electronic Signature(s) Signed: 07/29/2023 5:23:50 PM By: Wyn Iha Entered By: Wyn Iha on 07/29/2023 13:47:12 -------------------------------------------------------------------------------- Patient/Caregiver Education Details Patient Name: Date of Service: Mark  RDY, PA UL E. 1/13/2025andnbsp1:15 PM Medical Record Number: [id number] Patient Account Number: [id number] Date of Birth/Gender: Treating RN: 04-03-1935 (89 y.o. NETTY Cammie Sailors Primary Care Physician: Avelina No Other Clinician: Referring Physician: Treating Physician/Extender: Rosan Raisin Avelina No Weeks in Treatment: 3 Education Assessment Education Provided To: Patient Education Topics Provided Wound/Skin Impairment: Methods: Explain/Verbal Responses: State content correctly Electronic Signature(s) Signed: 07/29/2023 5:06:40 PM By: Cammie Sailors RN, BSN Entered By: Cammie Sailors on 07/29/2023 14:27:36 LAWERNCE DEWARD BRAVO ([id number]) [id number] Page 7 of 8 -------------------------------------------------------------------------------- Wound Assessment Details Patient Name: Date of Service: Mark RDY, PA UL E. 07/29/2023 1:15 PM Medical Record Number: [id number] Patient Account Number: [id number] Date of Birth/Sex: Treating RN: [birth year] (89 y.o. NETTY Cammie Sailors Primary Care Tavarion Babington: Avelina No Other Clinician: Referring Wm Fruchter: Treating Edit Ricciardelli/Extender: Rosan Raisin Avelina, Amy Weeks in Treatment: 3 Wound Status Wound Number: 1 Primary Venous Leg Ulcer Etiology: Wound Location: Right, Posterior Lower Leg Wound Status: Healed - Epithelialized Wounding Event: Gradually Appeared Comorbid Coronary Artery Disease, Hypertension,  Cirrhosis , Type II Date Acquired: 06/14/2023 History: Diabetes, Gout Weeks Of Treatment: 3 Clustered Wound: No Photos Wound Measurements Length: (cm) Width: (cm) Depth: (cm) Area: (cm) Volume: (cm) 0 % Reduction in Area: 100% 0 % Reduction in Volume: 100% 0 Epithelialization: Large (67-100%) 0 Tunneling: No 0 Undermining: No Wound Description Classification: Full Thickness Without Exposed Suppor Wound Margin: Distinct, outline attached Exudate Amount: None Present t Structures Foul Odor After  Cleansing: No Slough/Fibrino No Wound Bed Granulation Amount: None Present (0%) Exposed Structure Necrotic Amount: None Present (0%) Fascia Exposed: No Fat Layer (Subcutaneous Tissue) Exposed: No Tendon Exposed: No Muscle Exposed: No Joint Exposed: No Bone Exposed: No Periwound Skin Texture Texture Color No Abnormalities Noted: No No Abnormalities Noted: No Callus: No Atrophie Blanche: No Crepitus: No Cyanosis: No Excoriation: No Ecchymosis: No Induration: No Erythema: No Rash: No Hemosiderin Staining: Yes Scarring: No Mottled: No Pallor: No Moisture Rubor: No No Abnormalities Noted: No Dry / Scaly: No Temperature / Pain Maceration: No Temperature: No Abnormality Treatment Notes Wound #1 (Lower Leg) Wound Laterality: Right, Posterior LANDERS, PRAJAPATI ([id number]) [id number] Page 8 of 8 Cleanser Peri-Wound Care Topical Primary Dressing Secondary Dressing Secured With Compression Wrap Compression Stockings Add-Ons Electronic Signature(s) Signed: 07/29/2023 5:06:40 PM By: Cammie Sailors RN, BSN Entered By: Cammie Sailors on 07/29/2023 14:23:13 -------------------------------------------------------------------------------- Vitals Details Patient Name: Date of Service: Mark RDY, PA UL E. 07/29/2023 1:15 PM Medical Record Number: [id number] Patient Account Number: [id number] Date of Birth/Sex: Treating RN: 05-06-1935 (89 y.o. M) Primary Care [place]: Avelina No Other Clinician: Referring Sharonlee Nine: Treating Kynedi Profitt/Extender: Rosan Harlene Avelina, Amy Weeks in Treatment: 3 Vital Signs Time Taken: 13:48 Temperature (F): 97.4 Height (in): 74 Pulse (bpm): 65 Weight (lbs): 183 Respiratory Rate (breaths/min): 18 Body Mass Index (BMI): 23.5 Blood Pressure (mmHg): 127/73 Reference Range: 80 - 120 mg / dl Electronic Signature(s) Signed: 07/29/2023 5:23:50 PM By: Wyn Iha Entered By: Wyn Iha on 07/29/2023 13:51:28

## 2023-07-31 NOTE — Progress Notes (Signed)
 Grider, Cecile E ([id number]) [id number] Page 1 of 5 Visit Report for 07/29/2023 Chief Complaint Document Details Patient Name: Date of Service: Mark Le, Mark UL E. 07/29/2023 1:15 PM Medical Record Number: [id number] Patient Account Number: [id number] Date of Birth/Sex: Treating RN: Jul 29, 1934 (89 y.o. M) Primary Care Provider: Avelina No Other Clinician: Referring Provider: Treating Provider/Extender: Rosan Harlene Avelina, Amy Weeks in Treatment: 3 Information Obtained from: Patient Chief Complaint 07/08/2023; patient is here for review of a wound on the right lower leg Electronic Signature(s) Signed: 07/29/2023 5:12:23 PM By: Rosan Harlene DO Entered By: Rosan Harlene on 07/29/2023 15:43:15 -------------------------------------------------------------------------------- HPI Details Patient Name: Date of Service: Mark RDY, PA UL E. 07/29/2023 1:15 PM Medical Record Number: [id number] Patient Account Number: [id number] Date of Birth/Sex: Treating RN: [birth year] (89 y.o. M) Primary Care Provider: Avelina No Other Clinician: Referring Provider: Treating Provider/Extender: Rosan Harlene Avelina, Amy Weeks in Treatment: 3 History of Present Illness HPI Description: ADMISSION 07/08/2023 This is an 89 year old man who arrived accompanied by his sister and niece. He has had a wound on the right lower leg for about the last month perhaps more. He was seen in the ER on 06/28/2023. It was noted that he did not have a history of injury to the area but does that have a history of bilateral leg swelling. He was given doxycycline  for 7 days. He saw his primary doctor on 12/17 the doxycycline  was extended to 10 days. They have been using topical Neosporin gauze and Coban. Things have been improving Past medical history includes diet-controlled type 2 diabetes, thoracic aortic aneurysm, alcohol induced cirrhosis, coronary artery disease, esophageal  varices, chronic kidney disease, gout and macular degeneration. The patient lives with his sister who is present ABI in the right was 1.13 in our clinic 07/29/2023; patient presents for follow-up. He has been using Hydrofera Blue to the wound bed. He obtain compression stockings and has been wearing them daily to the lower extremities bilaterally. His wound is healed. He has no issues or complaints. Electronic Signature(s) Signed: 07/29/2023 5:12:23 PM By: Rosan Harlene DO Entered By: Rosan Harlene on 07/29/2023 15:46:31 LAWERNCE DEWARD BRAVO ([id number]) [id number] Page 2 of 5 -------------------------------------------------------------------------------- Physical Exam Details Patient Name: Date of Service: Mark RDY, PA UL E. 07/29/2023 1:15 PM Medical Record Number: [id number] Patient Account Number: [id number] Date of Birth/Sex: Treating RN: 1935/02/03 (89 y.o. M) Primary Care Provider: Avelina No Other Clinician: Referring Provider: Treating Provider/Extender: Rosan Harlene Avelina, Amy Weeks in Treatment: 3 Constitutional respirations regular, non-labored and within target range for patient.. Cardiovascular 2+ dorsalis pedis/posterior tibialis pulses. Psychiatric pleasant and cooperative. Notes T the right medial lower leg there is epithelization to the previous wound site. Good edema control. No signs of surrounding infection. o Electronic Signature(s) Signed: 07/29/2023 5:12:23 PM By: Rosan Harlene DO Entered By: Rosan Harlene on 07/29/2023 15:47:07 -------------------------------------------------------------------------------- Physician Orders Details Patient Name: Date of Service: Mark RDY, PA UL E. 07/29/2023 1:15 PM Medical Record Number: [id number] Patient Account Number: [id number] Date of Birth/Sex: Treating RN: 1934-09-21 (89 y.o. NETTY Cammie Sailors Primary Care Provider: Avelina No Other Clinician: Referring Provider: Treating  Provider/Extender: Rosan Harlene Avelina, Amy Weeks in Treatment: 3 Verbal / Phone Orders: No Diagnosis Coding Discharge From [facility] Services Discharge from Wound Care Center - Congratulations! Continue to apply hydrofera blue to any places that may open, wear compression socks daily Bathing/ Shower/ Hygiene May shower and wash wound with soap and water . - with dressing changes. Edema Control - Orders / Instructions Elevate legs to the  level of the heart or above for 30 minutes daily and/or when sitting for 3-4 times a day throughout the day. Avoid standing for long periods of time. Exercise regularly Moisturize legs daily. Electronic Signature(s) Signed: 07/29/2023 5:12:23 PM By: Rosan Raisin DO Entered By: Rosan Raisin on 07/29/2023 15:47:20 LAWERNCE DEWARD BRAVO ([id number]) [id number] Page 3 of 5 -------------------------------------------------------------------------------- Problem List Details Patient Name: Date of Service: Mark RDY, PA UL E. 07/29/2023 1:15 PM Medical Record Number: [id number] Patient Account Number: [id number] Date of Birth/Sex: Treating RN: 05-02-1935 (89 y.o. M) Primary Care Provider: Avelina No Other Clinician: Referring Provider: Treating Provider/Extender: Rosan Raisin Avelina, Amy Weeks in Treatment: 3 Active Problems ICD-10 Encounter Code Description Active Date MDM Diagnosis I87.311 Chronic venous hypertension (idiopathic) with ulcer of right lower extremity 07/08/2023 No Yes L97.818 Non-pressure chronic ulcer of other part of right lower leg with other specified 07/08/2023 No Yes severity E11.622 Type 2 diabetes mellitus with other skin ulcer 07/08/2023 No Yes Inactive Problems Resolved Problems Electronic Signature(s) Signed: 07/29/2023 5:12:23 PM By: Rosan Raisin DO Entered By: Rosan Raisin on 07/29/2023  15:42:39 -------------------------------------------------------------------------------- Progress Note Details Patient Name: Date of Service: Mark RDY, PA UL E. 07/29/2023 1:15 PM Medical Record Number: [id number] Patient Account Number: [id number] Date of Birth/Sex: Treating RN: [birth year] (89 y.o. M) Primary Care Provider: Avelina No Other Clinician: Referring Provider: Treating Provider/Extender: Rosan Raisin Avelina, Amy Weeks in Treatment: 3 Subjective Chief Complaint Information obtained from Patient 07/08/2023; patient is here for review of a wound on the right lower leg History of Present Illness (HPI) ADMISSION 07/08/2023 This is an 89 year old man who arrived accompanied by his sister and niece. He has had a wound on the right lower leg for about the last month perhaps more. He was seen in the ER on 06/28/2023. It was noted that he did not have a history of injury to the area but does that have a history of bilateral leg swelling. He was given doxycycline  for 7 days. He saw his primary doctor on 12/17 the doxycycline  was extended to 10 days. They have been using topical Neosporin gauze and Coban. Things have been improving SKEETER, SHEARD ([id number]) [phone].pdf Page 4 of 5 Past medical history includes diet-controlled type 2 diabetes, thoracic aortic aneurysm, alcohol induced cirrhosis, coronary artery disease, esophageal varices, chronic kidney disease, gout and macular degeneration. The patient lives with his sister who is present ABI in the right was 1.13 in our clinic 07/29/2023; patient presents for follow-up. He has been using Hydrofera Blue to the wound bed. He obtain compression stockings and has been wearing them daily to the lower extremities bilaterally. His wound is healed. He has no issues or complaints. Objective Constitutional respirations regular, non-labored and within target range for patient.. Vitals Time Taken: 1:48 PM,  Height: 74 in, Weight: 183 lbs, BMI: 23.5, Temperature: 97.4 F, Pulse: 65 bpm, Respiratory Rate: 18 breaths/min, Blood Pressure: 127/73 mmHg. Cardiovascular 2+ dorsalis pedis/posterior tibialis pulses. Psychiatric pleasant and cooperative. General Notes: T the right medial lower leg there is epithelization to the previous wound site. Good edema control. No signs of surrounding infection. o Integumentary (Hair, Skin) Wound #1 status is Healed - Epithelialized. Original cause of wound was Gradually Appeared. The date acquired was: 06/14/2023. The wound has been in treatment 3 weeks. The wound is located on the Right,Posterior Lower Leg. The wound measures 0cm length x 0cm width x 0cm depth; 0cm^2 area and 0cm^3 volume. There is no tunneling or undermining noted. There is a none present  amount of drainage noted. The wound margin is distinct with the outline attached to the wound base. There is no granulation within the wound bed. There is no necrotic tissue within the wound bed. The periwound skin appearance exhibited: Hemosiderin Staining. The periwound skin appearance did not exhibit: Callus, Crepitus, Excoriation, Induration, Rash, Scarring, Dry/Scaly, Maceration, Atrophie Blanche, Cyanosis, Ecchymosis, Mottled, Pallor, Rubor, Erythema. Periwound temperature was noted as No Abnormality. Assessment Active Problems ICD-10 Chronic venous hypertension (idiopathic) with ulcer of right lower extremity Non-pressure chronic ulcer of other part of right lower leg with other specified severity Type 2 diabetes mellitus with other skin ulcer Patient has done well with Hydrofera Blue and using his compression stockings daily. His wound is healed. He knows to call with any questions or concerns. If any areas open off he can replace Hydrofera Blue and call us . He may follow-up as needed. Plan Discharge From [facility] Services: Discharge from Wound Care Center - Congratulations! Continue to apply hydrofera blue  to any places that may open, wear compression socks daily Bathing/ Shower/ Hygiene: May shower and wash wound with soap and water . - with dressing changes. Edema Control - Orders / Instructions: Elevate legs to the level of the heart or above for 30 minutes daily and/or when sitting for 3-4 times a day throughout the day. Avoid standing for long periods of time. Exercise regularly Moisturize legs daily. 1. Compression stockings daily. 2. Follow-up as needed Electronic Signature(s) Signed: 07/29/2023 5:12:23 PM By: Rosan Harlene ROSALEA LAWERNCE DEWARD FORBES ([id number]) [id number] Page 5 of 5 Entered By: Rosan Harlene on 07/29/2023 15:50:59 -------------------------------------------------------------------------------- SuperBill Details Patient Name: Date of Service: Mark Le, Mark UL E. 07/29/2023 Medical Record Number: [id number] Patient Account Number: [id number] Date of Birth/Sex: Treating RN: October 06, 1934 (89 y.o. NETTY Cammie Sailors Primary Care Provider: Avelina No Other Clinician: Referring Provider: Treating Provider/Extender: Rosan Harlene Avelina, Amy Weeks in Treatment: 3 Diagnosis Coding ICD-10 Codes Code Description I87.311 Chronic venous hypertension (idiopathic) with ulcer of right lower extremity L97.818 Non-pressure chronic ulcer of other part of right lower leg with other specified severity E11.622 Type 2 diabetes mellitus with other skin ulcer Facility Procedures : CPT4 Code: [id number] Description: 99213 - WOUND CARE VISIT-LEV 3 EST PT Modifier: Quantity: 1 Physician Procedures : CPT4 Code Description Modifier [id number] - WC PHYS LEVEL 3 - EST PT ICD-10 Diagnosis Description I87.311 Chronic venous hypertension (idiopathic) with ulcer of right lower extremity L97.818 Non-pressure chronic ulcer of other part of right lower  leg with other specified severity E11.622 Type 2 diabetes mellitus with other skin ulcer Quantity: 1 Electronic  Signature(s) Signed: 07/29/2023 5:12:23 PM By: Rosan Harlene DO Entered By: Rosan Harlene on 07/29/2023 15:51:13

## 2023-08-16 ENCOUNTER — Other Ambulatory Visit (INDEPENDENT_AMBULATORY_CARE_PROVIDER_SITE_OTHER): Payer: Medicare Other

## 2023-08-16 ENCOUNTER — Encounter: Payer: Self-pay | Admitting: Family Medicine

## 2023-08-16 DIAGNOSIS — E781 Pure hyperglyceridemia: Secondary | ICD-10-CM | POA: Diagnosis not present

## 2023-08-16 DIAGNOSIS — E1169 Type 2 diabetes mellitus with other specified complication: Secondary | ICD-10-CM | POA: Diagnosis not present

## 2023-08-16 LAB — COMPREHENSIVE METABOLIC PANEL
ALT: 16 U/L (ref 0–53)
AST: 27 U/L (ref 0–37)
Albumin: 3.6 g/dL (ref 3.5–5.2)
Alkaline Phosphatase: 91 U/L (ref 39–117)
BUN: 9 mg/dL (ref 6–23)
CO2: 33 meq/L — ABNORMAL HIGH (ref 19–32)
Calcium: 9 mg/dL (ref 8.4–10.5)
Chloride: 102 meq/L (ref 96–112)
Creatinine, Ser: 1.14 mg/dL (ref 0.40–1.50)
GFR: 58.12 mL/min — ABNORMAL LOW (ref >60.00)
Glucose, Bld: 108 mg/dL — ABNORMAL HIGH (ref 70–99)
Potassium: 4.9 meq/L (ref 3.5–5.1)
Sodium: 141 meq/L (ref 135–145)
Total Bilirubin: 0.7 mg/dL (ref 0.2–1.2)
Total Protein: 6.5 g/dL (ref 6.0–8.3)

## 2023-08-16 LAB — LIPID PANEL
Cholesterol: 140 mg/dL (ref 0–200)
HDL: 43.1 mg/dL (ref >39.00)
LDL Cholesterol: 68 mg/dL (ref 0–99)
NonHDL: 97.12
Total CHOL/HDL Ratio: 3
Triglycerides: 147 mg/dL (ref 0.0–149.0)
VLDL: 29.4 mg/dL (ref 0.0–40.0)

## 2023-08-16 LAB — HEMOGLOBIN A1C: Hgb A1c MFr Bld: 5.4 % (ref 4.6–6.5)

## 2023-08-16 NOTE — Progress Notes (Signed)
 No critical labs need to be addressed urgently. We will discuss labs in detail at upcoming office visit.

## 2023-08-22 ENCOUNTER — Encounter (HOSPITAL_BASED_OUTPATIENT_CLINIC_OR_DEPARTMENT_OTHER): Payer: Medicare Other | Admitting: Internal Medicine

## 2023-08-23 ENCOUNTER — Ambulatory Visit: Payer: Medicare Other | Admitting: Family Medicine

## 2023-08-23 ENCOUNTER — Encounter: Payer: Self-pay | Admitting: Family Medicine

## 2023-08-23 VITALS — BP 104/70 | HR 61 | Temp 97.6°F | Ht 74.0 in | Wt 182.2 lb

## 2023-08-23 DIAGNOSIS — I10 Essential (primary) hypertension: Secondary | ICD-10-CM

## 2023-08-23 DIAGNOSIS — E1169 Type 2 diabetes mellitus with other specified complication: Secondary | ICD-10-CM

## 2023-08-23 DIAGNOSIS — E781 Pure hyperglyceridemia: Secondary | ICD-10-CM | POA: Diagnosis not present

## 2023-08-23 DIAGNOSIS — D509 Iron deficiency anemia, unspecified: Secondary | ICD-10-CM | POA: Diagnosis not present

## 2023-08-23 LAB — CBC WITH DIFFERENTIAL/PLATELET
Basophils Absolute: 0 10*3/uL (ref 0.0–0.1)
Basophils Relative: 1.1 % (ref 0.0–3.0)
Eosinophils Absolute: 0.2 10*3/uL (ref 0.0–0.7)
Eosinophils Relative: 4.9 % (ref 0.0–5.0)
HCT: 35 % — ABNORMAL LOW (ref 39.0–52.0)
Hemoglobin: 11.7 g/dL — ABNORMAL LOW (ref 13.0–17.0)
Lymphocytes Relative: 36.2 % (ref 12.0–46.0)
Lymphs Abs: 1.4 10*3/uL (ref 0.7–4.0)
MCHC: 33.5 g/dL (ref 30.0–36.0)
MCV: 100.8 fL — ABNORMAL HIGH (ref 78.0–100.0)
Monocytes Absolute: 0.4 10*3/uL (ref 0.1–1.0)
Monocytes Relative: 11.3 % (ref 3.0–12.0)
Neutro Abs: 1.8 10*3/uL (ref 1.4–7.7)
Neutrophils Relative %: 46.5 % (ref 43.0–77.0)
Platelets: 221 10*3/uL (ref 150.0–400.0)
RBC: 3.48 Mil/uL — ABNORMAL LOW (ref 4.22–5.81)
RDW: 13.7 % (ref 11.5–15.5)
WBC: 3.8 10*3/uL — ABNORMAL LOW (ref 4.0–10.5)

## 2023-08-23 LAB — IBC + FERRITIN
Ferritin: 69.9 ng/mL (ref 22.0–322.0)
Iron: 94 ug/dL (ref 42–165)
Saturation Ratios: 35.2 % (ref 20.0–50.0)
TIBC: 267.4 ug/dL (ref 250.0–450.0)
Transferrin: 191 mg/dL — ABNORMAL LOW (ref 212.0–360.0)

## 2023-08-23 NOTE — Assessment & Plan Note (Signed)
Stable, chronic.  Continue current medication.   Well controlled on coreg 12.5 mg BID, and lisinopril 10 mg daily

## 2023-08-23 NOTE — Assessment & Plan Note (Signed)
Chronic, well controlled with diet. 

## 2023-08-23 NOTE — Progress Notes (Signed)
 Patient ID: Mark Le, male    DOB: 08/20/1934, 89 y.o.   MRN: [id number]  This visit was conducted in person.  BP 104/70 (BP Location: Left Arm, Patient Position: Sitting, Cuff Size: Large)   Pulse 61   Temp 97.6 F (36.4 C) (Temporal)   Ht 6' 2 (1.88 m)   Wt 182 lb 4 oz (82.7 kg)   SpO2 99%   BMI 23.40 kg/m    CC:  Chief Complaint  Patient presents with   Medical Management of Chronic Issues    6 month follow up    Subjective:   HPI: Mark Le is a 89 y.o. male with medical history significant for DM2, thoracic aortic aneurysm, cirrhosis, CAD, esophageal varices, CKD, gout  presenting on 08/23/2023 for Medical Management of Chronic Issues (6 month follow up)     Diabetes:  well controlled with diet. Lab Results  Component Value Date   HGBA1C 5.4 08/16/2023   Hypertension:    On coreg  BID. BP Readings from Last 3 Encounters:  08/23/23 104/70  07/02/23 122/62  06/28/23 (!) 174/63  Using medication without problems or lightheadedness:  none Chest pain with exertion:none Edema: none Short of breath: none Average home BPs: Other issues:   High cholesterol on simvastatin   Lab Results  Component Value Date   CHOL 140 08/16/2023   HDL 43.10 08/16/2023   LDLCALC 68 08/16/2023   LDLDIRECT 81 01/21/2008   TRIG 147.0 08/16/2023   CHOLHDL 3 08/16/2023     Anemia, iron  deficient taking ferrous sulfate  325 mg daily.  Relevant past medical, surgical, family and social history reviewed and updated as indicated. Interim medical history since our last visit reviewed. Allergies and medications reviewed and updated. Outpatient Medications Prior to Visit  Medication Sig Dispense Refill   Accu-Chek Softclix Lancets lancets Use to obtain blood sugar sample once daily Dx Code E11.9 100 each 4   aspirin  81 MG tablet Take 81 mg by mouth daily.      carvedilol  (COREG ) 12.5 MG tablet TAKE 1 TABLET (12.5MG  TOTAL) BY MOUTH TWICE A DAY WITH MEALS 180 tablet 3   [facility] VITAMIN  B12 1000 MCG tablet TAKE 1 TABLET BY MOUTH EVERY DAY 200 tablet 1   donepezil  (ARICEPT ) 10 MG tablet TAKE 1 TABLET BY MOUTH EVERY DAY 90 tablet 3   glucose blood (ACCU-CHEK GUIDE) test strip Use to check blood sugar once daily. Dx Code E11.9 100 each 4   Iron , Ferrous Sulfate , 325 (65 Fe) MG TABS Take 325 mg by mouth daily. 30 tablet 5   lisinopril  (ZESTRIL ) 10 MG tablet TAKE 1 TABLET BY MOUTH EVERY DAY 90 tablet 1   Multiple Vitamins-Minerals (ONE-A-DAY MENS 50+ ADVANTAGE) TABS Take 1 tablet by mouth daily.     Multiple Vitamins-Minerals (PRESERVISION AREDS 2) CAPS Take 1 capsule by mouth 2 (two) times daily.     simvastatin  (ZOCOR ) 20 MG tablet TAKE 1 TABLET BY MOUTH EVERYDAY AT BEDTIME 90 tablet 1   No facility-administered medications prior to visit.     Per HPI unless specifically indicated in ROS section below Review of Systems  Constitutional:  Negative for fatigue and fever.  HENT:  Negative for ear pain.   Eyes:  Negative for pain.  Respiratory:  Negative for cough and shortness of breath.   Cardiovascular:  Negative for chest pain, palpitations and leg swelling.  Gastrointestinal:  Negative for abdominal pain.  Genitourinary:  Negative for dysuria.  Musculoskeletal:  Negative for arthralgias.  Neurological:  Negative for syncope, light-headedness and headaches.  Psychiatric/Behavioral:  Negative for dysphoric mood.    Objective:  BP 104/70 (BP Location: Left Arm, Patient Position: Sitting, Cuff Size: Large)   Pulse 61   Temp 97.6 F (36.4 C) (Temporal)   Ht 6' 2 (1.88 m)   Wt 182 lb 4 oz (82.7 kg)   SpO2 99%   BMI 23.40 kg/m   Wt Readings from Last 3 Encounters:  08/23/23 182 lb 4 oz (82.7 kg)  07/02/23 183 lb 8 oz (83.2 kg)  02/20/23 177 lb (80.3 kg)      Physical Exam Vitals reviewed.  Constitutional:      Appearance: He is well-developed.  HENT:     Head: Normocephalic.     Right Ear: Hearing normal.     Left Ear: Hearing normal.     Nose: Nose normal.   Neck:     Thyroid : No thyroid  mass or thyromegaly.     Vascular: No carotid bruit.     Trachea: Trachea normal.  Cardiovascular:     Rate and Rhythm: Normal rate and regular rhythm.     Pulses: Normal pulses.     Heart sounds: Heart sounds not distant. No murmur heard.    No friction rub. No gallop.     Comments: No peripheral edema Pulmonary:     Effort: Pulmonary effort is normal. No respiratory distress.     Breath sounds: Normal breath sounds.  Skin:    General: Skin is warm and dry.     Findings: Wound present. No erythema or rash.     Comments: Resolved leg wound  Psychiatric:        Speech: Speech normal.        Behavior: Behavior normal.        Thought Content: Thought content normal.       Results for orders placed or performed in visit on 08/23/23  CBC with Differential/Platelet   Collection Time: 08/23/23 12:28 PM  Result Value Ref Range   WBC 3.8 (L) 4.0 - 10.5 K/uL   RBC 3.48 (L) 4.22 - 5.81 Mil/uL   Hemoglobin 11.7 (L) 13.0 - 17.0 g/dL   HCT 64.9 (L) 60.9 - 47.9 %   MCV 100.8 (H) 78.0 - 100.0 fl   MCHC 33.5 30.0 - 36.0 g/dL   RDW 86.2 88.4 - 84.4 %   Platelets 221.0 150.0 - 400.0 K/uL   Neutrophils Relative % 46.5 43.0 - 77.0 %   Lymphocytes Relative 36.2 12.0 - 46.0 %   Monocytes Relative 11.3 3.0 - 12.0 %   Eosinophils Relative 4.9 0.0 - 5.0 %   Basophils Relative 1.1 0.0 - 3.0 %   Neutro Abs 1.8 1.4 - 7.7 K/uL   Lymphs Abs 1.4 0.7 - 4.0 K/uL   Monocytes Absolute 0.4 0.1 - 1.0 K/uL   Eosinophils Absolute 0.2 0.0 - 0.7 K/uL   Basophils Absolute 0.0 0.0 - 0.1 K/uL  IBC + Ferritin   Collection Time: 08/23/23 12:28 PM  Result Value Ref Range   Iron  94 42 - 165 ug/dL   Transferrin 808.9 (L) 212.0 - 360.0 mg/dL   Saturation Ratios 64.7 20.0 - 50.0 %   Ferritin 69.9 22.0 - 322.0 ng/mL   TIBC 267.4 250.0 - 450.0 mcg/dL    Assessment and Plan  Type 2 diabetes mellitus with other specified complication, without long-term current use of insulin  (HCC) Assessment & Plan: Chronic, well-controlled with diet   HYPERTENSION, BENIGN SYSTEMIC Assessment &  Plan: Stable, chronic.  Continue current medication.   Well controlled on coreg  12.5 mg BID, and lisinopril  10 mg daily   HYPERTRIGLYCERIDEMIA Assessment & Plan: well controlled on simvastatin  20 mg daily.   Iron  deficiency anemia, unspecified iron  deficiency anemia type Assessment & Plan:  Chronic, due for re-evaluation.  On ferous sulfate 325 mg daily without side effect.  Orders: -     CBC with Differential/Platelet -     IBC + Ferritin     Return in about 6 months (around 02/20/2024) for phone AMW,  fasting labs then CPE with me.   Greig Ring, MD

## 2023-08-23 NOTE — Assessment & Plan Note (Signed)
well controlled on simvastatin 20 mg daily.

## 2023-08-23 NOTE — Patient Instructions (Signed)
Please stop at the lab to have labs drawn.  

## 2023-09-04 NOTE — Assessment & Plan Note (Signed)
 Chronic, due for re-evaluation.  On ferous sulfate 325 mg daily without side effect.

## 2023-09-18 DIAGNOSIS — H353124 Nonexudative age-related macular degeneration, left eye, advanced atrophic with subfoveal involvement: Secondary | ICD-10-CM | POA: Diagnosis not present

## 2023-09-18 DIAGNOSIS — E119 Type 2 diabetes mellitus without complications: Secondary | ICD-10-CM | POA: Diagnosis not present

## 2023-09-18 DIAGNOSIS — H353113 Nonexudative age-related macular degeneration, right eye, advanced atrophic without subfoveal involvement: Secondary | ICD-10-CM | POA: Diagnosis not present

## 2023-09-18 DIAGNOSIS — E113292 Type 2 diabetes mellitus with mild nonproliferative diabetic retinopathy without macular edema, left eye: Secondary | ICD-10-CM | POA: Diagnosis not present

## 2023-09-18 DIAGNOSIS — H43813 Vitreous degeneration, bilateral: Secondary | ICD-10-CM | POA: Diagnosis not present

## 2023-10-02 NOTE — Progress Notes (Signed)
 Jahayra Mazo T. Theresa Wedel, MD, CAQ Sports Medicine [facility] [address] [place], [address]  Phone: [phone]  FAX: [phone]  Mark Le - 89 y.o. male  MRN [id number]  Date of Birth: 08-27-1934  Date: 10/03/2023  PCP: Excell Seltzer, MD  Referral: Excell Seltzer, MD  Chief Complaint  Patient presents with   Hip Pain    Left   Leg Pain    Left   Subjective:   Mark Le is a 89 y.o. very pleasant male patient with Body mass index is 23.27 kg/m. who presents with the following:  He is a pleasant patient presents with some ongoing left-sided hip complaints and left-sided leg pain.  Medically, he does have a history of coronary disease, chronic kidney disease as well as cirrhosis.  Whole leg is hurting in the posterior buttocks, has been having a hard time standing.  He denies any anterior groin pain.  While he does have pain in the buttocks, he also has some pain going down the leg.  He has also had some tingling in the lower extremities without frank numbness.  This has been ongoing for about 1 week.  He has not tried any medications Heating pad did not really help    Review of Systems is noted in the HPI, as appropriate  Objective:   BP 138/60 (BP Location: Left Arm, Patient Position: Sitting, Cuff Size: Large)   Pulse 77   Temp 97.8 F (36.6 C) (Temporal)   Ht 6\' 2"  (1.88 m)   Wt 181 lb 4 oz (82.2 kg)   SpO2 98%   BMI 23.27 kg/m   GEN: No acute distress; alert,appropriate. PULM: Breathing comfortably in no respiratory distress PSYCH: Normally interactive.    Range of motion at  the waist: Flexion, extension, lateral bending and rotation: He does have some tenderness from L4-S1 bilaterally, he also has some pain in the posterior buttocks region.  Forward flexion is mildly impaired, but he has preserved extension, lateral bending, and rotation  No echymosis or edema Rises to examination table with mild  difficulty Gait: minimally antalgic  Inspection/Deformity: N  B Ankle Dorsiflexion (L5,4): 5/5 B Great Toe Dorsiflexion (L5,4): 5/5 Heel Walk (L5): WNL Toe Walk (S1): WNL Rise/Squat (L4): WNL, mild pain  SENSORY B Medial Foot (L4): WNL B Dorsum (L5): WNL B Lateral (S1): WNL Light Touch: WNL Pinprick: WNL  B SLR, seated: neg B SLR, supine: neg B FABER: Back and buttocks pain B Reverse FABER: neg B Greater Troch: NT B Log Roll: neg B Sciatic Notch: NT   Laboratory and Imaging Data: Lab Results  Component Value Date   HGBA1C 5.4 08/16/2023     Assessment and Plan:     ICD-10-CM   1. Lumbar pain with radiation down left leg  M54.50    M79.605     2. Stage 3a chronic kidney disease (HCC)  N18.31      Ongoing back pain that is somewhat limited.  With medical comorbidities, I am going to give him a round of some steroids to take, hopefully this will help with the buttocks pain and radicular pain.  Medication Management during today's office visit: Meds ordered this encounter  Medications   predniSONE (DELTASONE) 20 MG tablet    Sig: 2 tabs po daily for 5 days, then 1 tab po daily for 5 days    Dispense:  15 tablet    Refill:  0   There  are no discontinued medications.  Orders placed today for conditions managed today: No orders of the defined types were placed in this encounter.   Disposition: No follow-ups on file.  [employer] speech-to-text software was used for transcription in this dictation.  Possible transcriptional errors can occur using Animal nutritionist.   Signed,  Elpidio Galea. Castella Lerner, MD   Outpatient Encounter Medications as of 10/03/2023  Medication Sig   Accu-Chek Softclix Lancets lancets Use to obtain blood sugar sample once daily Dx Code E11.9   aspirin 81 MG tablet Take 81 mg by mouth daily.    carvedilol (COREG) 12.5 MG tablet TAKE 1 TABLET (12.5MG  TOTAL) BY MOUTH TWICE A DAY WITH MEALS   [facility] VITAMIN B12 1000 MCG tablet TAKE 1  TABLET BY MOUTH EVERY DAY   donepezil (ARICEPT) 10 MG tablet TAKE 1 TABLET BY MOUTH EVERY DAY   glucose blood (ACCU-CHEK GUIDE) test strip Use to check blood sugar once daily. Dx Code E11.9   Iron, Ferrous Sulfate, 325 (65 Fe) MG TABS Take 325 mg by mouth daily.   lisinopril (ZESTRIL) 10 MG tablet TAKE 1 TABLET BY MOUTH EVERY DAY   Multiple Vitamins-Minerals (ONE-A-DAY MENS 50+ ADVANTAGE) TABS Take 1 tablet by mouth daily.   Multiple Vitamins-Minerals (PRESERVISION AREDS 2) CAPS Take 1 capsule by mouth 2 (two) times daily.   predniSONE (DELTASONE) 20 MG tablet 2 tabs po daily for 5 days, then 1 tab po daily for 5 days   simvastatin (ZOCOR) 20 MG tablet TAKE 1 TABLET BY MOUTH EVERYDAY AT BEDTIME   No facility-administered encounter medications on file as of 10/03/2023.

## 2023-10-03 ENCOUNTER — Encounter: Payer: Self-pay | Admitting: Family Medicine

## 2023-10-03 ENCOUNTER — Ambulatory Visit (INDEPENDENT_AMBULATORY_CARE_PROVIDER_SITE_OTHER): Admitting: Family Medicine

## 2023-10-03 VITALS — BP 138/60 | HR 77 | Temp 97.8°F | Ht 74.0 in | Wt 181.2 lb

## 2023-10-03 DIAGNOSIS — M545 Low back pain, unspecified: Secondary | ICD-10-CM

## 2023-10-03 DIAGNOSIS — M79605 Pain in left leg: Secondary | ICD-10-CM | POA: Diagnosis not present

## 2023-10-03 DIAGNOSIS — N1831 Chronic kidney disease, stage 3a: Secondary | ICD-10-CM | POA: Diagnosis not present

## 2023-10-03 MED ORDER — PREDNISONE 20 MG PO TABS: ORAL_TABLET | ORAL | 0 refills | Status: DC

## 2023-10-05 ENCOUNTER — Encounter: Payer: Self-pay | Admitting: Family Medicine

## 2023-10-11 ENCOUNTER — Ambulatory Visit: Payer: Self-pay | Admitting: Family Medicine

## 2023-10-11 NOTE — Telephone Encounter (Signed)
 Copied from CRM [id number]. Topic: Clinical - Red Word Triage >> Oct 11, 2023  9:08 AM Gurney Maxin H wrote: [name] that prompted transfer to Nurse Triage: Patient is having pain in left leg, seen provider last week and was given predniSONE (DELTASONE) 20 MG tablet and not working still having pain.  Chief Complaint: Leg pain Symptoms: Pain Frequency: 1-2 weeks Pertinent Negatives: Patient denies relief Disposition: [] ED /[] Urgent Care (no appt availability in office) / [x] Appointment(In office/virtual)/ []  [facility]/ [] Home Care/ [] Refused Recommended Disposition /[] [place] [facility]/ []  Follow-up with PCP Additional Notes: Spoke to patient's sister, Cordelia Pen, on behalf of her brother who has been experiencing left leg pain for 1-2 weeks. Patient was seen in the office by Dr. Patsy Lager on 10/03/23. Patient has been taking prednisone as prescribed and denied relief. Pain is located from knee down and radiates to hip at times. Sister reports that patient is able to walk normally at this time, without limping. Denied swelling, redness, numbness/tingling, chest pain and difficulty breathing. Sister requested to see Dr. Patsy Lager again. Patient scheduled in office for Monday afternoon. This RN advised patient/sister to call back if symptoms worsen. Patient/sister complied.   Reason for Disposition  [1] MILD pain (e.g., does not interfere with normal activities) AND [2] present > 7 days  Answer Assessment - Initial Assessment Questions 1. ONSET: "When did the pain start?"      1-2 weeks, seen in office last Thursday 2. LOCATION: "Where is the pain located?"      Left leg from knee down, states pain radiates to hip at time 3. PAIN: "How bad is the pain?"    (Scale 1-10; or mild, moderate, severe)   -  MILD (1-3): doesn't interfere with normal activities    -  MODERATE (4-7): interferes with normal activities (e.g., work or school) or awakens from sleep, limping    -  SEVERE (8-10):  excruciating pain, unable to do any normal activities, unable to walk     States he is still able to walk ok, denies falls, denies having to limp 4. WORK OR EXERCISE: "Has there been any recent work or exercise that involved this part of the body?"      Denies 5. CAUSE: "What do you think is causing the leg pain?"     Unknown- possibly nerve damage 6. OTHER SYMPTOMS: "Do you have any other symptoms?" (e.g., chest pain, back pain, breathing difficulty, swelling, rash, fever, numbness, weakness)     Denies swelling, denies chest pain, denies difficulty breathing, denies numbness/tingling  Protocols used: Leg Pain-A-AH

## 2023-10-11 NOTE — Telephone Encounter (Signed)
 I will reassess.  I think it is tough situation in an 89 year old with multiple medical problems and significant lumbar radiculopathy.

## 2023-10-13 NOTE — Progress Notes (Unsigned)
     Mark Mandell T. Lis Savitt, MD, CAQ Sports Medicine [facility] at [facility] [address] [place], [address]  Phone: [phone]  FAX: [phone]  Mark Le - 89 y.o. male  MRN [id number]  Date of Birth: February 25, 1935  Date: 10/14/2023  PCP: Mark Seltzer, MD  Referral: Mark Seltzer, MD  No chief complaint on file.  Subjective:   Mark Le is a 89 y.o. very pleasant male patient with There is no height or weight on file to calculate BMI. who presents with the following:  Mr. Mark Le is a pleasant 89 year old gentleman, and I saw him on October 03, 2023 and also felt like he was having symptoms of significant low back pain with radiculopathy down the left leg.  When I last saw him, this seemed to be somewhat limited in in pain and scope, however he has not responded to a round of oral steroids.    Review of Systems is noted in the HPI, as appropriate  Objective:   There were no vitals taken for this visit.  GEN: No acute distress; alert,appropriate. PULM: Breathing comfortably in no respiratory distress PSYCH: Normally interactive.   Laboratory and Imaging Data:  Assessment and Plan:   ***

## 2023-10-14 ENCOUNTER — Ambulatory Visit (INDEPENDENT_AMBULATORY_CARE_PROVIDER_SITE_OTHER): Admitting: Family Medicine

## 2023-10-14 ENCOUNTER — Encounter: Payer: Self-pay | Admitting: Family Medicine

## 2023-10-14 ENCOUNTER — Ambulatory Visit (INDEPENDENT_AMBULATORY_CARE_PROVIDER_SITE_OTHER)
Admission: RE | Admit: 2023-10-14 | Discharge: 2023-10-14 | Disposition: A | Discharge status: Home / Self Care | Source: Ambulatory Visit | Attending: Family Medicine | Admitting: Family Medicine

## 2023-10-14 VITALS — BP 130/60 | HR 70 | Temp 97.7°F | Ht 74.0 in | Wt 178.4 lb

## 2023-10-14 DIAGNOSIS — K7469 Other cirrhosis of liver: Secondary | ICD-10-CM

## 2023-10-14 DIAGNOSIS — M4726 Other spondylosis with radiculopathy, lumbar region: Secondary | ICD-10-CM | POA: Diagnosis not present

## 2023-10-14 DIAGNOSIS — M545 Low back pain, unspecified: Secondary | ICD-10-CM | POA: Diagnosis not present

## 2023-10-14 DIAGNOSIS — M7072 Other bursitis of hip, left hip: Secondary | ICD-10-CM

## 2023-10-14 DIAGNOSIS — M79605 Pain in left leg: Secondary | ICD-10-CM | POA: Diagnosis not present

## 2023-10-14 DIAGNOSIS — M419 Scoliosis, unspecified: Secondary | ICD-10-CM | POA: Diagnosis not present

## 2023-10-14 DIAGNOSIS — M7062 Trochanteric bursitis, left hip: Secondary | ICD-10-CM

## 2023-10-14 DIAGNOSIS — N1831 Chronic kidney disease, stage 3a: Secondary | ICD-10-CM | POA: Diagnosis not present

## 2023-10-14 MED ORDER — TRIAMCINOLONE ACETONIDE 40 MG/ML IJ SUSP
60.0000 mg | Freq: Once | INTRAMUSCULAR | Status: AC
  Administered 2023-10-14: 60 mg via INTRA_ARTICULAR

## 2023-10-14 MED ORDER — GABAPENTIN 100 MG PO CAPS: 100.0000 mg | ORAL_CAPSULE | Freq: Three times a day (TID) | ORAL | 3 refills | Status: AC

## 2023-10-14 NOTE — Patient Instructions (Signed)
Hip Rehab:  Hip Flexion: Toe up to ceiling, laying on your back. Lift your whole leg, 3 sets. Work up to being able to do #30 with each set.  Hip elevations, Toe and leg turned out to side.  Lift whole leg, 3 sets. Work up to being able to do #30 with each set.  Hip Abductions: Lying on side, straight out to side. 3 sets, work up to being able to do #30 with each set.  At the beginning you may only be able to do a lot less, try to do #10.

## 2023-10-24 ENCOUNTER — Other Ambulatory Visit: Payer: Self-pay | Admitting: Family Medicine

## 2023-10-24 DIAGNOSIS — I10 Essential (primary) hypertension: Secondary | ICD-10-CM

## 2023-11-13 DIAGNOSIS — H43813 Vitreous degeneration, bilateral: Secondary | ICD-10-CM | POA: Diagnosis not present

## 2023-11-13 DIAGNOSIS — H353113 Nonexudative age-related macular degeneration, right eye, advanced atrophic without subfoveal involvement: Secondary | ICD-10-CM | POA: Diagnosis not present

## 2023-11-13 DIAGNOSIS — H353124 Nonexudative age-related macular degeneration, left eye, advanced atrophic with subfoveal involvement: Secondary | ICD-10-CM | POA: Diagnosis not present

## 2023-11-13 DIAGNOSIS — E119 Type 2 diabetes mellitus without complications: Secondary | ICD-10-CM | POA: Diagnosis not present

## 2023-11-13 DIAGNOSIS — E113292 Type 2 diabetes mellitus with mild nonproliferative diabetic retinopathy without macular edema, left eye: Secondary | ICD-10-CM | POA: Diagnosis not present

## 2023-12-17 ENCOUNTER — Other Ambulatory Visit: Payer: Self-pay | Admitting: Family Medicine

## 2023-12-17 DIAGNOSIS — E781 Pure hyperglyceridemia: Secondary | ICD-10-CM

## 2023-12-22 ENCOUNTER — Other Ambulatory Visit: Payer: Self-pay | Admitting: Family Medicine

## 2024-01-08 DIAGNOSIS — H353113 Nonexudative age-related macular degeneration, right eye, advanced atrophic without subfoveal involvement: Secondary | ICD-10-CM | POA: Diagnosis not present

## 2024-01-08 DIAGNOSIS — H353124 Nonexudative age-related macular degeneration, left eye, advanced atrophic with subfoveal involvement: Secondary | ICD-10-CM | POA: Diagnosis not present

## 2024-01-08 DIAGNOSIS — H43813 Vitreous degeneration, bilateral: Secondary | ICD-10-CM | POA: Diagnosis not present

## 2024-01-08 DIAGNOSIS — E113292 Type 2 diabetes mellitus with mild nonproliferative diabetic retinopathy without macular edema, left eye: Secondary | ICD-10-CM | POA: Diagnosis not present

## 2024-02-07 ENCOUNTER — Ambulatory Visit: Payer: Medicare Other

## 2024-02-07 VITALS — Ht 74.0 in | Wt 178.0 lb

## 2024-02-07 DIAGNOSIS — Z0000 Encounter for general adult medical examination without abnormal findings: Secondary | ICD-10-CM | POA: Diagnosis not present

## 2024-02-07 NOTE — Patient Instructions (Signed)
 Mr. Mark Le , Thank you for taking time out of your busy schedule to complete your Annual Wellness Visit with me. I enjoyed our conversation and look forward to speaking with you again next year. I, as well as your care team,  appreciate your ongoing commitment to your health goals. Please review the following plan we discussed and let me know if I can assist you in the future. Your Game plan/ To Do List    Follow up Visits: Next Medicare AWV with our clinical staff: 02/09/25 @ 11:30am televisit   Have you seen your provider in the last 6 months (3 months if uncontrolled diabetes)? Yes Next Office Visit with your provider: 02/20/24 @ 11:00am  Clinician Recommendations:  Aim for 30 minutes of exercise or brisk walking, 6-8 glasses of water , and 5 servings of fruits and vegetables each day.       This is a list of the screening recommended for you and due dates:  Health Maintenance  Topic Date Due   Complete foot exam   03/01/2023   COVID-19 Vaccine (5 - 2024-25 season) 03/17/2023   Eye exam for diabetics  08/01/2023   Hemoglobin A1C  02/13/2024   Flu Shot  02/14/2024   Medicare Annual Wellness Visit  02/06/2025   DTaP/Tdap/Td vaccine (3 - Td or Tdap) 04/15/2033   Pneumococcal Vaccine for age over 31  Completed   Hepatitis B Vaccine  Completed   Zoster (Shingles) Vaccine  Completed   HPV Vaccine  Aged Out   Meningitis B Vaccine  Aged Out    Advanced directives: (In Chart) A copy of your advanced directives are scanned into your chart should your provider ever need it. Advance Care Planning is important because it:  [x]  Makes sure you receive the medical care that is consistent with your values, goals, and preferences  [x]  It provides guidance to your family and loved ones and reduces their decisional burden about whether or not they are making the right decisions based on your wishes.  Follow the link provided in your after visit summary or read over the paperwork we have mailed to you  to help you started getting your Advance Directives in place. If you need assistance in completing these, please reach out to us  so that we can help you!

## 2024-02-07 NOTE — Progress Notes (Signed)
 Please attest and cosign this visit due to patients primary care provider not being in the office at the time the visit was completed.    Subjective:   Mark Le is a 89 y.o. who presents for a Medicare Wellness preventive visit.  As a reminder, Annual Wellness Visits don't include a physical exam, and some assessments may be limited, especially if this visit is performed virtually. We may recommend an in-person follow-up visit with your provider if needed.  Visit Complete: Virtual I connected with  Mark Le on 02/07/24 by a audio enabled telemedicine application and verified that I am speaking with the correct person using two identifiers.  Patient Location: Home  Provider Location: Home Office  I discussed the limitations of evaluation and management by telemedicine. The patient expressed understanding and agreed to proceed.  Vital Signs: Because this visit was a virtual/telehealth visit, some criteria may be missing or patient reported. Any vitals not documented were not able to be obtained and vitals that have been documented are patient reported.  VideoDeclined- This patient declined Librarian, academic. Therefore the visit was completed with audio only.  Persons Participating in Visit: Patient assisted by sister/Ethel.  AWV Questionnaire: No: Patient Medicare AWV questionnaire was not completed prior to this visit.  Cardiac Risk Factors include: advanced age (>56men, >56 women);diabetes mellitus;hypertension;male gender;sedentary lifestyle;dyslipidemia     Objective:    Today's Vitals   02/07/24 1115  Weight: 178 lb (80.7 kg)  Height: 6' 2 (1.88 m)   Body mass index is 22.85 kg/m.     02/07/2024   11:23 AM 01/30/2023   11:34 AM 03/07/2022    7:13 AM 02/27/2022   10:46 AM 04/26/2017    8:50 AM 04/26/2017    7:42 AM 04/19/2017    8:16 AM  Advanced Directives  Does Patient Have a Medical Advance Directive? Yes Yes Yes Yes Yes  Yes   Yes   Type of Estate agent of [place];Living will Healthcare Power of [place];Living will Healthcare Power of [employer] Power of [employer] Power of [place];Living will Healthcare Power of [place];Living will Healthcare Power of [place];Living will  Does patient want to make changes to medical advance directive?    No - Patient declined  No - Patient declined  No - Patient declined   Copy of Healthcare Power of Attorney in Chart? Yes - validated most recent copy scanned in chart (See row information) No - copy requested  No - copy requested No - copy requested  No - copy requested       Data saved with a previous flowsheet row definition    Current Medications (verified) Outpatient Encounter Medications as of 02/07/2024  Medication Sig   Accu-Chek Softclix Lancets lancets Use to obtain blood sugar sample once daily Dx Code E11.9   aspirin  81 MG tablet Take 81 mg by mouth daily.    carvedilol  (COREG ) 12.5 MG tablet TAKE 1 TABLET (12.5MG  TOTAL) BY MOUTH TWICE A DAY WITH MEALS   [employer] VITAMIN B12 1000 MCG tablet TAKE 1 TABLET BY MOUTH EVERY DAY   donepezil  (ARICEPT ) 10 MG tablet TAKE 1 TABLET BY MOUTH EVERY DAY   ferrous sulfate  325 (65 FE) MG tablet TAKE 1 TABELT BY MOUTH DAILY.   gabapentin  (NEURONTIN ) 100 MG capsule Take 1-2 capsules (100-200 mg total) by mouth 3 (three) times daily.   glucose blood (ACCU-CHEK GUIDE) test strip Use to check blood sugar once daily. Dx Code E11.9  lisinopril  (ZESTRIL ) 10 MG tablet TAKE 1 TABLET BY MOUTH EVERY DAY   Multiple Vitamins-Minerals (PRESERVISION AREDS 2) CAPS Take 1 capsule by mouth 2 (two) times daily.   simvastatin  (ZOCOR ) 20 MG tablet TAKE 1 TABLET BY MOUTH EVERYDAY AT BEDTIME   No facility-administered encounter medications on file as of 02/07/2024.    Allergies (verified) Iohexol   History: Past Medical History:  Diagnosis Date   Aneurysm, thoracic aortic (HCC) 08/2016   4.2cm   B12 deficiency     CAD (coronary artery disease)    Evidence of previous inferior infarct by Myoview  2018   Chronic kidney disease    Cirrhosis (HCC)    Hep A and Hep B labs are negative. pt recieved Hep A and Hep B vaccines at [facility] in [place].    Colon cancer (HCC) 2002   T3 N2 tumor with 2 of 4 positive lymph nodes, s/p resection and colostomy and post-op chemotherapy. reversal of colostomy 2003.    Esophageal varices in cirrhosis [facility])    Essential hypertension    Gout 06/04/2011   Hyperlipidemia    Type 2 diabetes mellitus (HCC)    Ventral hernia 07/24/2011   Past Surgical History:  Procedure Laterality Date   CATARACT EXTRACTION W/PHACO Left 09/11/2016   Procedure: CATARACT EXTRACTION PHACO AND INTRAOCULAR LENS PLACEMENT (IOC);  Surgeon: Oneil Platts, MD;  Location: [facility];  Service: Ophthalmology;  Laterality: Left;  CDE: 19.23   CATARACT EXTRACTION W/PHACO Right 10/09/2016   Procedure: CATARACT EXTRACTION PHACO AND INTRAOCULAR LENS PLACEMENT (IOC);  Surgeon: Oneil Platts, MD;  Location: [facility];  Service: Ophthalmology;  Laterality: Right;  CDE: 8.08   COLONOSCOPY  11/2002   Dr. Alm Angle: anastomosis at 45cm, otherwise unremarkable.   COLONOSCOPY  05/2006   Dr. Alm Angle: 4 polyps removed.   COLONOSCOPY  07/2009   Dr. Alm Angle: 2 tubular adenomas, next TCS five years.    COLONOSCOPY N/A 06/20/2016   Dr. Shaaron multiple colonic polyps removed, several in piecemeal fashion, a ascending colon polyp tubular adenoma with focal high-grade dysplasia, cecal polyps tubulovillous adenoma. Next colonoscopy June 2018   COLONOSCOPY N/A 12/27/2016   Dr. Shaaron: Internal hemorrhoids, grade 1.  3 semi-pedunculated polyps in the ascending colon, removed.  Polyp in the appendiceal orifice (tubulovillous adenoma), sessile, piecemeal removal, not complete.  8 mm polyp in the ascending colon   COLOSTOMY REVERSAL  01/2002   Dr. Alm Angle   ESOPHAGOGASTRODUODENOSCOPY N/A 06/20/2016   Dr. Shaaron: Large  hiatal hernia, esophagus normal   ESOPHAGOGASTRODUODENOSCOPY (EGD) WITH PROPOFOL  N/A 03/07/2022   Procedure: ESOPHAGOGASTRODUODENOSCOPY (EGD) WITH PROPOFOL ;  Surgeon: Therisa Bi, MD;  Location: [facility] ENDOSCOPY;  Service: Gastroenterology;  Laterality: N/A;   LAPAROSCOPIC APPENDECTOMY N/A 04/26/2017   Procedure: APPENDECTOMY LAPAROSCOPIC;  Surgeon: Sheldon Standing, MD;  Location: [facility];  Service: General;  Laterality: N/A;   LYSIS OF ADHESION N/A 04/26/2017   Procedure: LYSIS OF ADHESION;  Surgeon: Sheldon Standing, MD;  Location: [facility];  Service: General;  Laterality: N/A;   PARTIAL COLECTOMY  04/2001   Dr. Alm Angle: obstructing colon carcinoma of the left colon with resection and colostomy   VENTRAL HERNIA REPAIR  04/2005   Dr. Alm Angle   Family History  Problem Relation Age of Onset   Heart disease Father    Cancer Brother        lung   Colon cancer Neg Hx    Social History   Socioeconomic History   Marital status: Divorced  Spouse name: Not on file   Number of children: 2   Years of education: high school   Highest education level: Not on file  Occupational History   Not on file  Tobacco Use   Smoking status: Former    Current packs/day: 0.00    Average packs/day: 1.5 packs/day for 15.0 years (22.5 ttl pk-yrs)    Types: Cigarettes    Start date: 09/06/1965    Quit date: 09/06/1980    Years since quitting: 43.4   Smokeless tobacco: Never  Vaping Use   Vaping status: Never Used  Substance and Sexual Activity   Alcohol use: No   Drug use: No   Sexual activity: Never    Birth control/protection: None  Other Topics Concern   Not on file  Social History Narrative   10/16/21   From: the area   Living: with sister since July 2022   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       Family: 2 children - Alm (Beatrice) and Programmer, systems (NO) - has grandchildren - does not have a good relationship with children      Enjoys: watch TV, used to paint      Exercise: not  currently   Diet: pretty healthy      Safety   Seat belts: Yes    Guns: No   Safe in relationships: Yes       Social Drivers of Corporate investment banker Strain: Low Risk  (02/07/2024)   Overall Financial Resource Strain (CARDIA)    Difficulty of Paying Living Expenses: Not hard at all  Food Insecurity: No Food Insecurity (02/07/2024)   Hunger Vital Sign    Worried About Running Out of Food in the Last Year: Never true    Ran Out of Food in the Last Year: Never true  Transportation Needs: No Transportation Needs (02/07/2024)   PRAPARE - Administrator, Civil Service (Medical): No    Lack of Transportation (Non-Medical): No  Physical Activity: Inactive (02/07/2024)   Exercise Vital Sign    Days of Exercise per Week: 0 days    Minutes of Exercise per Session: 0 min  Stress: No Stress Concern Present (02/07/2024)   [employer] of Occupational Health - Occupational Stress Questionnaire    Feeling of Stress: Not at all  Social Connections: Socially Isolated (02/07/2024)   Social Connection and Isolation Panel    Frequency of Communication with Friends and Family: Never    Frequency of Social Gatherings with Friends and Family: Once a week    Attends Religious Services: Never    Database administrator or Organizations: No    Attends Engineer, structural: Never    Marital Status: Divorced    Tobacco Counseling Counseling given: Not Answered    Clinical Intake:  Pre-visit preparation completed: Yes  Pain : No/denies pain     BMI - recorded: 22.85 Nutritional Status: BMI of 19-24  Normal Nutritional Risks: None Diabetes: Yes CBG done?: No Did pt. bring in CBG monitor from home?: No  Lab Results  Component Value Date   HGBA1C 5.4 08/16/2023   HGBA1C 5.2 02/06/2023   HGBA1C 5.2 10/16/2021     How often do you need to have someone help you when you read instructions, pamphlets, or other written materials from your doctor or pharmacy?: 1 -  Never  Interpreter Needed?: No  Comments: lives with sister Information entered by :: B.Kanyla Omeara,LPN   Activities of Daily Living  02/07/2024   11:23 AM  In your present state of health, do you have any difficulty performing the following activities:  Hearing? 0  Vision? 0  Difficulty concentrating or making decisions? 0  Walking or climbing stairs? 0  Dressing or bathing? 0  Doing errands, shopping? 0  Preparing Food and eating ? N  Using the Toilet? N  In the past six months, have you accidently leaked urine? N  Do you have problems with loss of bowel control? N  Managing your Medications? N  Managing your Finances? N  Housekeeping or managing your Housekeeping? N    Patient Care Team: Avelina Greig BRAVO, MD as PCP - General (Family Medicine) Debera Jayson MATSU, MD as PCP - Cardiology (Cardiology) Shaaron Lamar HERO, MD as Consulting Physician (Gastroenterology) Sheldon Standing, MD as Consulting Physician (General Surgery) Debera Jayson MATSU, MD as Consulting Physician (Cardiology) Jarold Mayo, MD as Consulting Physician (Ophthalmology) Portia Fireman, OD (Optometry)  I have updated your Care Teams any recent Medical Services you may have received from other providers in the past year.     Assessment:   This is a routine wellness examination for [place].  Hearing/Vision screen Hearing Screening - Comments:: Pt says his hearing is good Vision Screening - Comments:: Pt says his vision is nx:djbd he has AMD Dr [facility]   Goals Addressed             This Visit's Progress    Patient Stated       02/07/24-No goals     COMPLETED: Patient Stated   Not on track    Pt will try to walk 15-20 minutes a day.       Depression Screen     02/07/2024   11:20 AM 01/30/2023   11:32 AM 02/27/2022   10:38 AM  PHQ 2/9 Scores  PHQ - 2 Score 0 0 0    Fall Risk     02/07/2024   11:18 AM 01/30/2023   11:27 AM  Fall Risk   Falls in the past year? 0 0   Number falls in past yr: 0 0  Injury with Fall? 0 0  Risk for fall due to : No Fall Risks No Fall Risks  Follow up Education provided;Falls prevention discussed Falls prevention discussed;Falls evaluation completed    MEDICARE RISK AT HOME:  Medicare Risk at Home Any stairs in or around the home?: No If so, are there any without handrails?: Yes Home free of loose throw rugs in walkways, pet beds, electrical cords, etc?: Yes Adequate lighting in your home to reduce risk of falls?: Yes Life alert?: No Use of a cane, walker or w/c?: No Grab bars in the bathroom?: Yes Shower chair or bench in shower?: Yes Elevated toilet seat or a handicapped toilet?: Yes  TIMED UP AND GO:  Was the test performed?  No  Cognitive Function: 6CIT completed      11/27/2021   11:49 AM  Montreal Cognitive Assessment   Visuospatial/ Executive (0/5) 3  Naming (0/3) 2  Attention: Read list of digits (0/2) 2  Attention: Read list of letters (0/1) 1  Attention: Serial 7 subtraction starting at 100 (0/3) 1  Language: Repeat phrase (0/2) 2  Language : Fluency (0/1) 1  Abstraction (0/2) 1  Delayed Recall (0/5) 1  Orientation (0/6) 3  Total 17      02/07/2024   11:25 AM 01/30/2023   11:36 AM 02/27/2022   10:33 AM  6CIT Screen  What Year?  0 points 0 points 0 points  What month? 0 points 0 points 0 points  What time? 0 points 0 points 0 points  Count back from 20 0 points 0 points 0 points  Months in reverse 0 points 0 points 4 points  Repeat phrase 0 points 2 points 0 points  Total Score 0 points 2 points 4 points    Immunizations Immunization History  Administered Date(s) Administered   Fluad Quad(high Dose 65+) 04/12/2022   Hep A / Hep B 01/30/2018, 03/02/2018, 08/02/2018, 04/27/2022   Influenza Whole 05/26/2007, 04/05/2008   Influenza, High Dose Seasonal PF 03/12/2017, 04/03/2018, 03/13/2019, 03/20/2023   PFIZER(Purple Top)SARS-COV-2 Vaccination 09/21/2019, 10/13/2019, 05/04/2020    PNEUMOCOCCAL CONJUGATE-20 04/12/2022, 04/16/2023   Pfizer Covid-19 Vaccine Bivalent Booster 58yrs & up 07/18/2021   Pneumococcal Polysaccharide-23 05/16/2002   Td 09/14/2003   Tdap 04/16/2023   Zoster Recombinant(Shingrix) 03/20/2023, 06/03/2023    Screening Tests Health Maintenance  Topic Date Due   FOOT EXAM  03/01/2023   COVID-19 Vaccine (5 - 2024-25 season) 03/17/2023   HEMOGLOBIN A1C  02/13/2024   INFLUENZA VACCINE  02/14/2024   OPHTHALMOLOGY EXAM  11/12/2024   Medicare Annual Wellness (AWV)  02/06/2025   DTaP/Tdap/Td (3 - Td or Tdap) 04/15/2033   Pneumococcal Vaccine: 50+ Years  Completed   Hepatitis B Vaccines  Completed   Zoster Vaccines- Shingrix  Completed   HPV VACCINES  Aged Out   Meningococcal B Vaccine  Aged Out    Health Maintenance  Health Maintenance Due  Topic Date Due   FOOT EXAM  03/01/2023   COVID-19 Vaccine (5 - 2024-25 season) 03/17/2023   Health Maintenance Items Addressed: None needed at this time  Additional Screening:  Vision Screening: Recommended annual ophthalmology exams for early detection of glaucoma and other disorders of the eye. Would you like a referral to an eye doctor? No    Dental Screening: Recommended annual dental exams for proper oral hygiene  Community Resource Referral / Chronic Care Management: CRR required this visit?  No   CCM required this visit?  No   Plan:    I have personally reviewed and noted the following in the patient's chart:   Medical and social history Use of alcohol, tobacco or illicit drugs  Current medications and supplements including opioid prescriptions. Patient is not currently taking opioid prescriptions. Functional ability and status Nutritional status Physical activity Advanced directives List of other physicians Hospitalizations, surgeries, and ER visits in previous 12 months Vitals Screenings to include cognitive, depression, and falls Referrals and appointments  In addition, I  have reviewed and discussed with patient certain preventive protocols, quality metrics, and best practice recommendations. A written personalized care plan for preventive services as well as general preventive health recommendations were provided to patient.   Erminio LITTIE Saris, LPN   [id number]/74/7974   After Visit Summary: (MyChart) Due to this being a telephonic visit, the after visit summary with patients personalized plan was offered to patient via MyChart   Notes: Nothing significant to report at this time.

## 2024-02-20 ENCOUNTER — Encounter: Payer: Self-pay | Admitting: Family Medicine

## 2024-02-20 ENCOUNTER — Ambulatory Visit (INDEPENDENT_AMBULATORY_CARE_PROVIDER_SITE_OTHER): Payer: Medicare Other | Admitting: Family Medicine

## 2024-02-20 VITALS — BP 128/60 | HR 57 | Temp 97.7°F | Ht 74.0 in | Wt 185.0 lb

## 2024-02-20 DIAGNOSIS — F03B0 Unspecified dementia, moderate, without behavioral disturbance, psychotic disturbance, mood disturbance, and anxiety: Secondary | ICD-10-CM

## 2024-02-20 DIAGNOSIS — D508 Other iron deficiency anemias: Secondary | ICD-10-CM

## 2024-02-20 DIAGNOSIS — I5032 Chronic diastolic (congestive) heart failure: Secondary | ICD-10-CM

## 2024-02-20 DIAGNOSIS — E781 Pure hyperglyceridemia: Secondary | ICD-10-CM

## 2024-02-20 DIAGNOSIS — E538 Deficiency of other specified B group vitamins: Secondary | ICD-10-CM | POA: Diagnosis not present

## 2024-02-20 DIAGNOSIS — I700 Atherosclerosis of aorta: Secondary | ICD-10-CM | POA: Diagnosis not present

## 2024-02-20 DIAGNOSIS — Z0000 Encounter for general adult medical examination without abnormal findings: Secondary | ICD-10-CM

## 2024-02-20 DIAGNOSIS — I712 Thoracic aortic aneurysm, without rupture, unspecified: Secondary | ICD-10-CM

## 2024-02-20 DIAGNOSIS — E1169 Type 2 diabetes mellitus with other specified complication: Secondary | ICD-10-CM

## 2024-02-20 DIAGNOSIS — I10 Essential (primary) hypertension: Secondary | ICD-10-CM

## 2024-02-20 DIAGNOSIS — R6889 Other general symptoms and signs: Secondary | ICD-10-CM

## 2024-02-20 DIAGNOSIS — K7030 Alcoholic cirrhosis of liver without ascites: Secondary | ICD-10-CM

## 2024-02-20 LAB — CBC WITH DIFFERENTIAL/PLATELET
Basophils Absolute: 0 K/uL (ref 0.0–0.1)
Basophils Relative: 0.7 % (ref 0.0–3.0)
Eosinophils Absolute: 0.2 K/uL (ref 0.0–0.7)
Eosinophils Relative: 5 % (ref 0.0–5.0)
HCT: 34.6 % — ABNORMAL LOW (ref 39.0–52.0)
Hemoglobin: 11.8 g/dL — ABNORMAL LOW (ref 13.0–17.0)
Lymphocytes Relative: 26.7 % (ref 12.0–46.0)
Lymphs Abs: 1.1 K/uL (ref 0.7–4.0)
MCHC: 34.1 g/dL (ref 30.0–36.0)
MCV: 100.7 fl — ABNORMAL HIGH (ref 78.0–100.0)
Monocytes Absolute: 0.6 K/uL (ref 0.1–1.0)
Monocytes Relative: 13.5 % — ABNORMAL HIGH (ref 3.0–12.0)
Neutro Abs: 2.3 K/uL (ref 1.4–7.7)
Neutrophils Relative %: 54.1 % (ref 43.0–77.0)
Platelets: 194 K/uL (ref 150.0–400.0)
RBC: 3.43 Mil/uL — ABNORMAL LOW (ref 4.22–5.81)
RDW: 13.2 % (ref 11.5–15.5)
WBC: 4.2 K/uL (ref 4.0–10.5)

## 2024-02-20 LAB — TSH: TSH: 4.38 u[IU]/mL (ref 0.35–5.50)

## 2024-02-20 LAB — HM DIABETES FOOT EXAM

## 2024-02-20 LAB — POCT GLYCOSYLATED HEMOGLOBIN (HGB A1C): Hemoglobin A1C: 5.1 % (ref 4.0–5.6)

## 2024-02-20 LAB — VITAMIN B12: Vitamin B-12: 633 pg/mL (ref 211–911)

## 2024-02-20 NOTE — Assessment & Plan Note (Signed)
 Due for re-eval.

## 2024-02-20 NOTE — Assessment & Plan Note (Signed)
Stable, chronic.  Continue current medication.   Well controlled on coreg 12.5 mg BID, and lisinopril 10 mg daily

## 2024-02-20 NOTE — Assessment & Plan Note (Signed)
well controlled on simvastatin 20 mg daily.

## 2024-02-20 NOTE — Assessment & Plan Note (Signed)
 On B12 supplement. Due for re-eval.

## 2024-02-20 NOTE — Assessment & Plan Note (Signed)
 Last ECHO 2018. Stable pedal edema, not on lasix and unable to  wear compression hose.

## 2024-02-20 NOTE — Assessment & Plan Note (Signed)
 Compensated cirrhosis, no alcohol use followed by GI. Following for increased risk of esophageal varices with EGD every 2 years. None seen on EGD 03/09/2022    Encouraged follow-up with GI.  Re-eval LFTs today

## 2024-02-20 NOTE — Assessment & Plan Note (Signed)
Stable, mild... followed by neurology, Dr. Manuella Ghazi.  On aricept 10 mg daily

## 2024-02-20 NOTE — Assessment & Plan Note (Signed)
On asa 81 mg daily and statin.

## 2024-02-20 NOTE — Assessment & Plan Note (Signed)
2020 CT: Slight dilatation of the ascending thoracic aorta to a diameter 4.1 cm, unchanged since the prior study in 2018.  Discussed this diagnosis in detail with patient.  He and sister both agree that he does not want to follow this aggressively. They have been informed completely and have chosen to no longer follow this with chest CT or referral to cardiology/vascular.

## 2024-02-20 NOTE — Progress Notes (Signed)
 Patient ID: Mark Le, male    DOB: 06-12-1935, 89 y.o.   MRN: [id number]  This visit was conducted in person.  BP 128/60   Pulse (!) 57   Temp 97.7 F (36.5 C) (Oral)   Ht 6' 2 (1.88 m)   Wt 185 lb (83.9 kg)   SpO2 99%   BMI 23.75 kg/m    CC:  Chief Complaint  Patient presents with   Annual Exam    Subjective:   HPI: Mark Le is a 89 y.o. male with medical history significant for DM2, thoracic aortic aneurysm, cirrhosis, CAD, esophageal varices, CKD, gout  presenting on 02/20/2024 for Annual Exam  The patient saw a LPN or RN for medicare wellness visit. 02/07/24  Prevention and wellness was reviewed in detail. Note reviewed and important notes copied below.     Diabetes:  well controlled with diet in past due for re-eval. Lab Results  Component Value Date   HGBA1C 5.1 02/20/2024   Hypertension:    On coreg  BID. BP Readings from Last 3 Encounters:  02/20/24 128/60  10/14/23 130/60  10/03/23 138/60  Using medication without problems or lightheadedness:  none Chest pain with exertion:none Edema: none Short of breath: none Average home BPs: Other issues:  Diastolic heart failure... stable peripheral swelling  Wt Readings from Last 3 Encounters:  02/20/24 185 lb (83.9 kg)  02/07/24 178 lb (80.7 kg)  10/14/23 178 lb 6 oz (80.9 kg)      High cholesterol on simvastatin   Lab Results  Component Value Date   CHOL 140 08/16/2023   HDL 43.10 08/16/2023   LDLCALC 68 08/16/2023   LDLDIRECT 81 01/21/2008   TRIG 147.0 08/16/2023   CHOLHDL 3 08/16/2023     Anemia, iron  deficient taking ferrous sulfate  325 mg daily.  Relevant past medical, surgical, family and social history reviewed and updated as indicated. Interim medical history since our last visit reviewed. Allergies and medications reviewed and updated. Outpatient Medications Prior to Visit  Medication Sig Dispense Refill   aspirin  81 MG tablet Take 81 mg by mouth daily.      carvedilol  (COREG ) 12.5  MG tablet TAKE 1 TABLET (12.5MG  TOTAL) BY MOUTH TWICE A DAY WITH MEALS 180 tablet 3   [employer] VITAMIN B12 1000 MCG tablet TAKE 1 TABLET BY MOUTH EVERY DAY 200 tablet 1   donepezil  (ARICEPT ) 10 MG tablet TAKE 1 TABLET BY MOUTH EVERY DAY 90 tablet 3   ferrous sulfate  325 (65 FE) MG tablet TAKE 1 TABELT BY MOUTH DAILY. 90 tablet 1   gabapentin  (NEURONTIN ) 100 MG capsule Take 1-2 capsules (100-200 mg total) by mouth 3 (three) times daily. 60 capsule 3   lisinopril  (ZESTRIL ) 10 MG tablet TAKE 1 TABLET BY MOUTH EVERY DAY 90 tablet 1   Multiple Vitamins-Minerals (PRESERVISION AREDS 2) CAPS Take 1 capsule by mouth 2 (two) times daily.     simvastatin  (ZOCOR ) 20 MG tablet TAKE 1 TABLET BY MOUTH EVERYDAY AT BEDTIME 90 tablet 1   Accu-Chek Softclix Lancets lancets Use to obtain blood sugar sample once daily Dx Code E11.9 (Patient not taking: Reported on 02/20/2024) 100 each 4   glucose blood (ACCU-CHEK GUIDE) test strip Use to check blood sugar once daily. Dx Code E11.9 (Patient not taking: Reported on 02/20/2024) 100 each 4   No facility-administered medications prior to visit.     Per HPI unless specifically indicated in ROS section below Review of Systems  Constitutional:  Negative for fatigue  and fever.  HENT:  Negative for ear pain.   Eyes:  Negative for pain.  Respiratory:  Negative for cough and shortness of breath.   Cardiovascular:  Negative for chest pain, palpitations and leg swelling.  Gastrointestinal:  Negative for abdominal pain.  Genitourinary:  Negative for dysuria.  Musculoskeletal:  Negative for arthralgias.  Neurological:  Negative for syncope, light-headedness and headaches.  Psychiatric/Behavioral:  Negative for dysphoric mood.    Objective:  BP 128/60   Pulse (!) 57   Temp 97.7 F (36.5 C) (Oral)   Ht 6' 2 (1.88 m)   Wt 185 lb (83.9 kg)   SpO2 99%   BMI 23.75 kg/m   Wt Readings from Last 3 Encounters:  02/20/24 185 lb (83.9 kg)  02/07/24 178 lb (80.7 kg)  10/14/23 178 lb  6 oz (80.9 kg)      Physical Exam Vitals reviewed.  Constitutional:      Appearance: He is well-developed.  HENT:     Head: Normocephalic.     Right Ear: Hearing normal.     Left Ear: Hearing normal.     Nose: Nose normal.  Neck:     Thyroid : No thyroid  mass or thyromegaly.     Vascular: No carotid bruit.     Trachea: Trachea normal.  Cardiovascular:     Rate and Rhythm: Normal rate and regular rhythm.     Pulses: Normal pulses.     Heart sounds: Heart sounds not distant. No murmur heard.    No friction rub. No gallop.     Comments: No peripheral edema Pulmonary:     Effort: Pulmonary effort is normal. No respiratory distress.     Breath sounds: Normal breath sounds.  Skin:    General: Skin is warm and dry.     Findings: Wound present. No erythema or rash.     Comments: Resolving leg wound.. no broken skin, released by wound center.  Psychiatric:        Speech: Speech normal.        Behavior: Behavior normal.        Thought Content: Thought content normal.    Diabetic foot exam: Normal inspection except dry skin No skin breakdown No calluses  Normal DP pulses Normal sensation to light touch and monofilament Nails thickened  Results for orders placed or performed in visit on 02/20/24  HM DIABETES FOOT EXAM   Collection Time: 02/20/24 12:00 AM  Result Value Ref Range   HM Diabetic Foot Exam done   POCT glycosylated hemoglobin (Hb A1C)   Collection Time: 02/20/24 11:32 AM  Result Value Ref Range   Hemoglobin A1C 5.1 4.0 - 5.6 %   HbA1c POC (<> result, manual entry)     HbA1c, POC (prediabetic range)     HbA1c, POC (controlled diabetic range)      Assessment and Plan The patient's preventative maintenance and recommended screening tests for an annual wellness exam were reviewed in full today. Brought up to date unless services declined.  Counselled on the importance of diet, exercise, and its role in overall health and mortality. The patient's FH and SH was  reviewed, including their home life, tobacco status, and drug and alcohol status.   Vaccines: Tdap indicated but not covered, consider shingrix.. Prostate Cancer Screen: aged out of testing Colon Cancer Screen: aged out of testing      Smoking Status: former ETOH/ drug use: none/none  Hep C:  done    Routine general medical examination at  a health care facility  Type 2 diabetes mellitus with other specified complication, without long-term current use of insulin (HCC) Assessment & Plan:  Due for re-eval.  Orders: -     Lipid panel -     Comprehensive metabolic panel with GFR -     POCT glycosylated hemoglobin (Hb A1C)  HYPERTENSION, BENIGN SYSTEMIC Assessment & Plan: Stable, chronic.  Continue current medication.   Well controlled on coreg  12.5 mg BID, and lisinopril  10 mg daily   Thoracic aortic aneurysm without rupture, unspecified part [facility]) Assessment & Plan:  2020 CT: Slight dilatation of the ascending thoracic aorta to a diameter 4.1 cm, unchanged since the prior study in 2018.  Discussed this diagnosis in detail with patient.  He and sister both agree that he does not want to follow this aggressively. They have been informed completely and have chosen to no longer follow this with chest CT or referral to cardiology/vascular.   Alcoholic cirrhosis of liver without ascites (HCC) Assessment & Plan: Compensated cirrhosis, no alcohol use followed by GI. Following for increased risk of esophageal varices with EGD every 2 years. None seen on EGD 03/09/2022    Encouraged follow-up with GI.  Re-eval LFTs today   Moderate dementia without behavioral disturbance, psychotic disturbance, mood disturbance, or anxiety, unspecified dementia type (HCC) Assessment & Plan:  Stable, mild... followed by neurology, Dr. Maree.  On aricept  10 mg daily   HYPERTRIGLYCERIDEMIA Assessment & Plan: well controlled on simvastatin  20 mg daily.   Chronic diastolic heart failure  [facility]) Assessment & Plan: Last ECHO 2018. Stable pedal edema, not on lasix and unable to  wear compression hose.     Vitamin B12 deficiency Assessment & Plan:  On B12 supplement. Due for re-eval.  Orders: -     Vitamin B12  Aortic atherosclerosis (HCC) Assessment & Plan:  On asa 81 mg daily and statin.   Other iron  deficiency anemia Assessment & Plan:  Chronic, due for re-evaluation.  On ferous sulfate 325 mg daily without side effect.  Orders: -     CBC with Differential/Platelet -     IBC + Ferritin  Cold intolerance -     TSH      Return in about 6 months (around 08/22/2024) for diabetes follow up with labs prior.   Greig Ring, MD

## 2024-02-20 NOTE — Assessment & Plan Note (Signed)
 Chronic, due for re-evaluation.  On ferous sulfate 325 mg daily without side effect.

## 2024-02-21 ENCOUNTER — Ambulatory Visit: Payer: Self-pay | Admitting: Family Medicine

## 2024-02-21 LAB — COMPREHENSIVE METABOLIC PANEL WITH GFR
ALT: 12 U/L (ref 0–53)
AST: 20 U/L (ref 0–37)
Albumin: 3.4 g/dL — ABNORMAL LOW (ref 3.5–5.2)
Alkaline Phosphatase: 74 U/L (ref 39–117)
BUN: 10 mg/dL (ref 6–23)
CO2: 33 meq/L — ABNORMAL HIGH (ref 19–32)
Calcium: 8.7 mg/dL (ref 8.4–10.5)
Chloride: 101 meq/L (ref 96–112)
Creatinine, Ser: 1.03 mg/dL (ref 0.40–1.50)
GFR: 65.41 mL/min (ref >60.00)
Glucose, Bld: 66 mg/dL — ABNORMAL LOW (ref 70–99)
Potassium: 4.5 meq/L (ref 3.5–5.1)
Sodium: 143 meq/L (ref 135–145)
Total Bilirubin: 0.4 mg/dL (ref 0.2–1.2)
Total Protein: 5.7 g/dL — ABNORMAL LOW (ref 6.0–8.3)

## 2024-02-21 LAB — LIPID PANEL
Cholesterol: 128 mg/dL (ref 0–200)
HDL: 37.1 mg/dL — ABNORMAL LOW (ref >39.00)
LDL Cholesterol: 42 mg/dL (ref 0–99)
NonHDL: 91.07
Total CHOL/HDL Ratio: 3
Triglycerides: 247 mg/dL — ABNORMAL HIGH (ref 0.0–149.0)
VLDL: 49.4 mg/dL — ABNORMAL HIGH (ref 0.0–40.0)

## 2024-02-21 LAB — IBC + FERRITIN
Ferritin: 79.4 ng/mL (ref 22.0–322.0)
Iron: 71 ug/dL (ref 42–165)
Saturation Ratios: 30.7 % (ref 20.0–50.0)
TIBC: 231 ug/dL — ABNORMAL LOW (ref 250.0–450.0)
Transferrin: 165 mg/dL — ABNORMAL LOW (ref 212.0–360.0)

## 2024-03-10 DIAGNOSIS — H353113 Nonexudative age-related macular degeneration, right eye, advanced atrophic without subfoveal involvement: Secondary | ICD-10-CM | POA: Diagnosis not present

## 2024-03-10 DIAGNOSIS — H43813 Vitreous degeneration, bilateral: Secondary | ICD-10-CM | POA: Diagnosis not present

## 2024-03-10 DIAGNOSIS — H353124 Nonexudative age-related macular degeneration, left eye, advanced atrophic with subfoveal involvement: Secondary | ICD-10-CM | POA: Diagnosis not present

## 2024-03-10 DIAGNOSIS — E113292 Type 2 diabetes mellitus with mild nonproliferative diabetic retinopathy without macular edema, left eye: Secondary | ICD-10-CM | POA: Diagnosis not present

## 2024-04-18 ENCOUNTER — Other Ambulatory Visit: Payer: Self-pay | Admitting: Family Medicine

## 2024-04-18 DIAGNOSIS — I10 Essential (primary) hypertension: Secondary | ICD-10-CM

## 2024-05-05 DIAGNOSIS — G43719 Chronic migraine without aura, intractable, without status migrainosus: Secondary | ICD-10-CM | POA: Diagnosis not present

## 2024-05-05 DIAGNOSIS — E538 Deficiency of other specified B group vitamins: Secondary | ICD-10-CM | POA: Diagnosis not present

## 2024-05-05 DIAGNOSIS — E113292 Type 2 diabetes mellitus with mild nonproliferative diabetic retinopathy without macular edema, left eye: Secondary | ICD-10-CM | POA: Diagnosis not present

## 2024-05-05 DIAGNOSIS — R413 Other amnesia: Secondary | ICD-10-CM | POA: Diagnosis not present

## 2024-05-05 DIAGNOSIS — H43813 Vitreous degeneration, bilateral: Secondary | ICD-10-CM | POA: Diagnosis not present

## 2024-05-05 DIAGNOSIS — H353134 Nonexudative age-related macular degeneration, bilateral, advanced atrophic with subfoveal involvement: Secondary | ICD-10-CM | POA: Diagnosis not present

## 2024-05-26 ENCOUNTER — Other Ambulatory Visit: Payer: Self-pay | Admitting: Family Medicine

## 2024-05-26 DIAGNOSIS — E781 Pure hyperglyceridemia: Secondary | ICD-10-CM

## 2024-06-03 ENCOUNTER — Other Ambulatory Visit: Payer: Self-pay | Admitting: Family Medicine

## 2024-06-03 DIAGNOSIS — F03B0 Unspecified dementia, moderate, without behavioral disturbance, psychotic disturbance, mood disturbance, and anxiety: Secondary | ICD-10-CM

## 2024-06-04 ENCOUNTER — Telehealth: Payer: Self-pay | Admitting: *Deleted

## 2024-06-04 ENCOUNTER — Ambulatory Visit: Payer: Self-pay

## 2024-06-04 NOTE — Telephone Encounter (Signed)
 Copied from CRM [id number]. Topic: General - Other >> Jun 04, 2024  1:30 PM Burnard DEL wrote: Reason for CRM: Patients niece called in after being triaged with nurse,and given the disposition for them to call 911. She called in stating that patient declined going to the hospital. She stated that he went to the grocery store and walked around the whole store,which is something that he doesn't do. She is wondering if the numbness came from him doing that. She would like to know if PCP would like to see him in the office.She would like to have some blood work done as well.

## 2024-06-04 NOTE — Telephone Encounter (Signed)
 I spoke with great niece; (DPR signed) earlier pt had gone to grocery store and walked a lot. Lauren wondered if that could cause numbness from the waist down. I advised no appts at [facility] and pt needs to go to ED for eval and testing now. Pt fell earlier this morning due to his legs gave way.,Pt has no H/A,dizziness, CP or SOB. Lauren will take pt to [facility] ED for eval and possible testing if needed now. Sending note to Dr Avelina and [place] [facility].

## 2024-06-04 NOTE — Telephone Encounter (Signed)
 FYI Only or Action Required?: FYI only for provider: ED advised.  Patient was last seen in primary care on 02/20/2024 by Avelina Greig BRAVO, MD.  Called Nurse Triage reporting Numbness.  Symptoms began several days ago.  Symptoms are: stable.  Triage Disposition: Call EMS 911 Now  Patient/caregiver understands and will follow disposition?: Yes     Copied from CRM #[id number]. Topic: Clinical - Red Word Triage >> Jun 04, 2024 12:38 PM [name] wrote: [name] that prompted transfer to Nurse Triage: Mark [name]  having issues, stated it felt like from the waist down is numb, fell today and is still numb from the waist down Reason for Disposition  [1] SEVERE weakness (e.g., unable to walk or barely able to walk, requires support) AND [2] new-onset or getting worse  Answer Assessment - Initial Assessment Questions This RN spoke with pt's [name], Mark [name], and patient. This RN offered to call pt an ambulance but pt [name] refused, stating she will take pt there now.  [name] today- legs gave out where pt states his legs went numb from waist down; numb for 5 minutes Episode on Sun where his legs felt numb but pt did not fall (first time it happened) Pt currently not numb  Protocols used: Neurologic Deficit-A-AH

## 2024-06-04 NOTE — Telephone Encounter (Signed)
 Agree with disposition.

## 2024-06-04 NOTE — Telephone Encounter (Signed)
 Please call and triage.

## 2024-06-10 ENCOUNTER — Other Ambulatory Visit: Payer: Self-pay | Admitting: Family Medicine

## 2025-02-09 ENCOUNTER — Ambulatory Visit
